# Patient Record
Sex: Female | Born: 1965 | Race: Black or African American | Hispanic: No | Marital: Married | Smoking: Never smoker
Health system: Southern US, Community
[De-identification: ages and names within clinical notes are randomized; demographics above are authoritative.]

## PROBLEM LIST (undated history)

## (undated) DIAGNOSIS — I10 Essential (primary) hypertension: Secondary | ICD-10-CM

## (undated) DIAGNOSIS — E785 Hyperlipidemia, unspecified: Secondary | ICD-10-CM

## (undated) HISTORY — PX: ABDOMINAL HYSTERECTOMY: SHX81

## (undated) HISTORY — PX: OTHER SURGICAL HISTORY: SHX169

---

## 1999-10-29 ENCOUNTER — Ambulatory Visit (HOSPITAL_COMMUNITY): Admission: RE | Admit: 1999-10-29 | Discharge: 1999-10-29 | Payer: Self-pay | Admitting: Family Medicine

## 1999-10-29 ENCOUNTER — Encounter: Payer: Self-pay | Admitting: Family Medicine

## 2002-08-20 ENCOUNTER — Ambulatory Visit (HOSPITAL_COMMUNITY): Admission: RE | Admit: 2002-08-20 | Discharge: 2002-08-20 | Payer: Self-pay | Admitting: Neurology

## 2002-08-20 ENCOUNTER — Encounter: Payer: Self-pay | Admitting: Neurology

## 2004-02-06 ENCOUNTER — Encounter: Admission: RE | Admit: 2004-02-06 | Discharge: 2004-02-06 | Payer: Self-pay | Admitting: Obstetrics & Gynecology

## 2005-12-22 ENCOUNTER — Emergency Department (HOSPITAL_COMMUNITY): Admission: EM | Admit: 2005-12-22 | Discharge: 2005-12-22 | Payer: Self-pay | Admitting: Emergency Medicine

## 2005-12-22 IMAGING — CT CT PELVIS W/ CM
3 of 5 series · 15 of 32 positions shown, 19 images · non-contrast
Comparison: none

CLINICAL DATA: Abdominal pain. Remote desmoid tumor resected. Tubal ligation.

[Series 2: abd pelvis · axial · 0.98mm/px · z∈[-409,-199]mm · 3 of 86 slices shown, 7 images]
[im 22/86  soft-tissue]
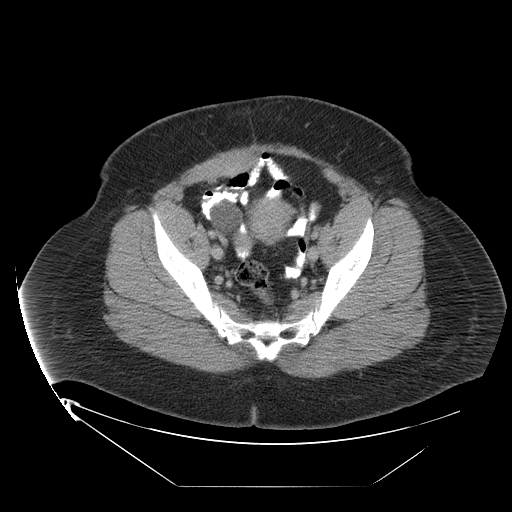
[im 22/86  lung]
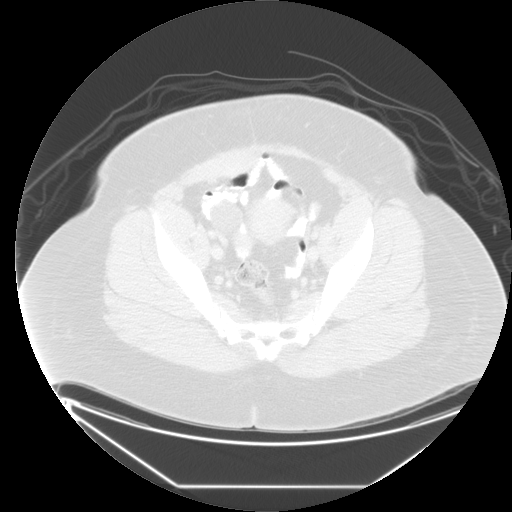
[im 22/86  bone]
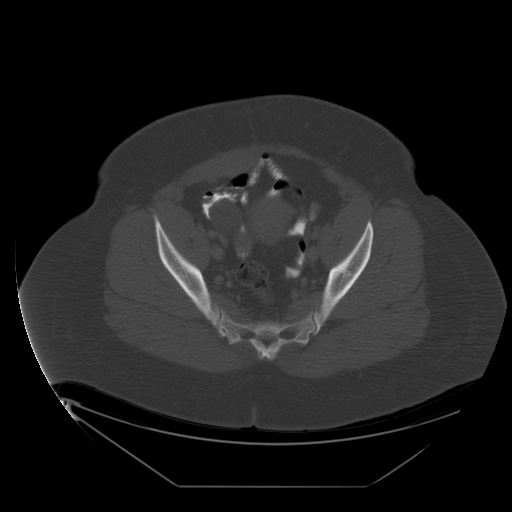
[im 43/86  soft-tissue]
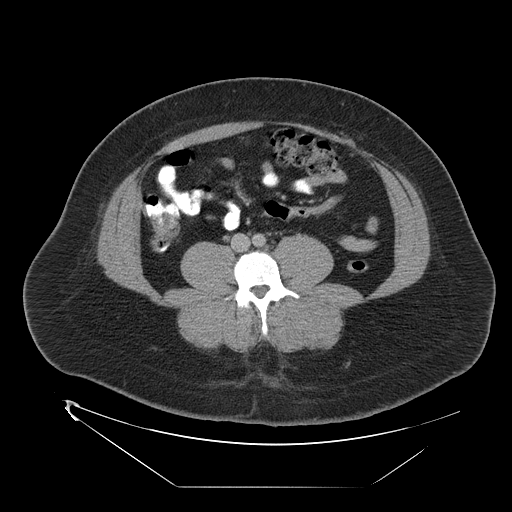
[im 43/86  lung]
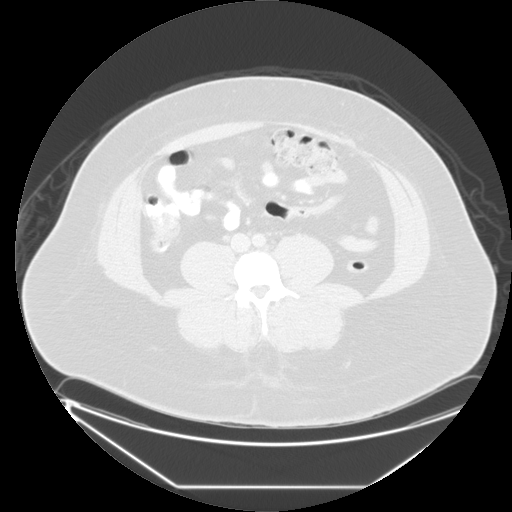
[im 64/86  soft-tissue]
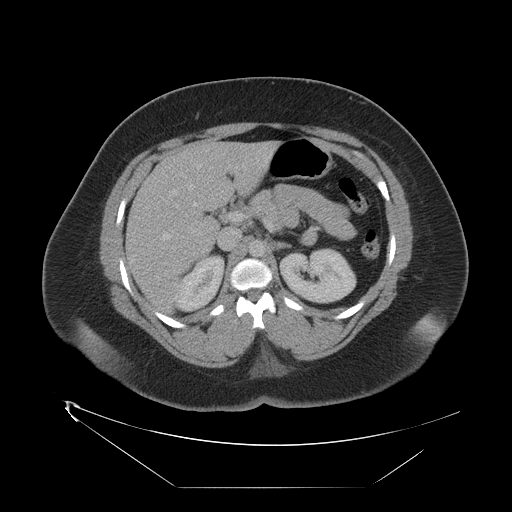
[im 64/86  lung]
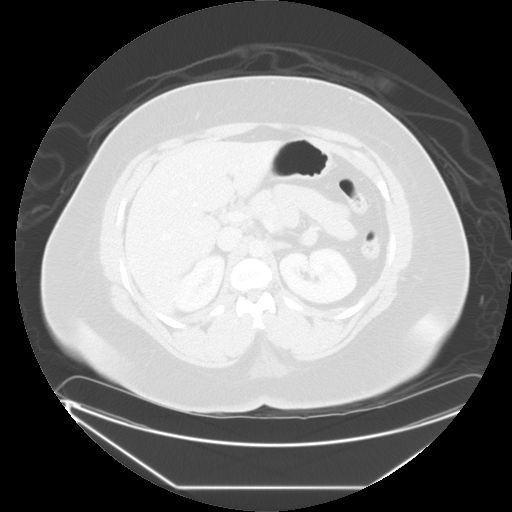

[Series 103: reformatted · sagittal · 0.98mm/px · 8 of 197 slices shown (1 of 2)]
[im 17/197  soft-tissue]
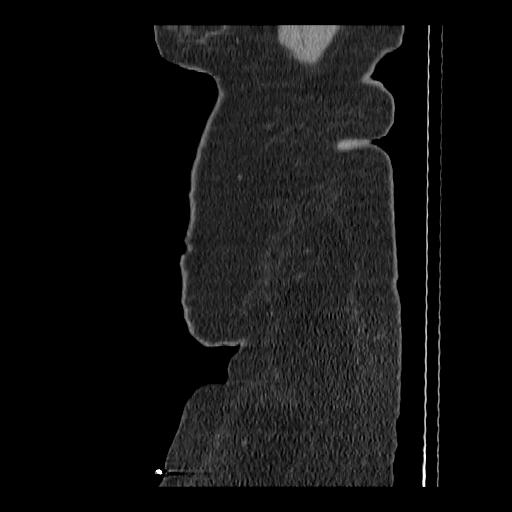
[im 50/197  soft-tissue]
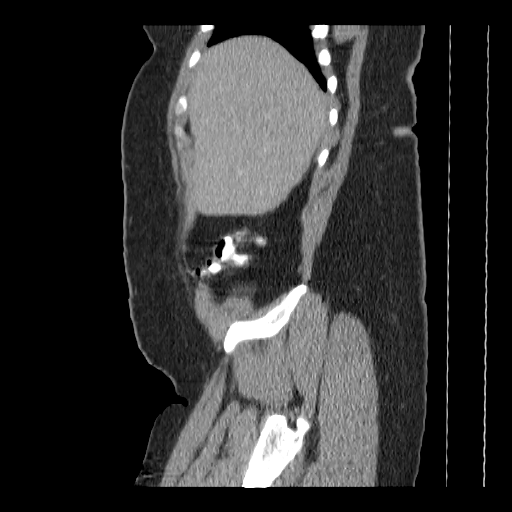
[im 66/197  soft-tissue]
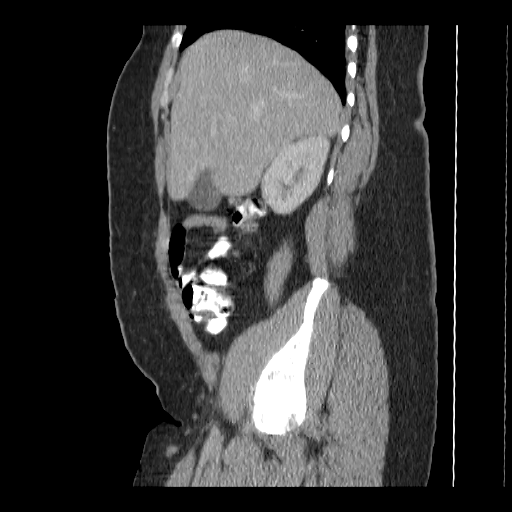
[im 82/197  soft-tissue]
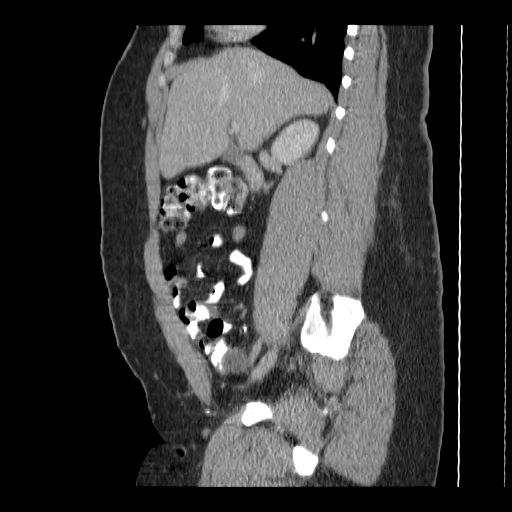
[im 115/197  soft-tissue]
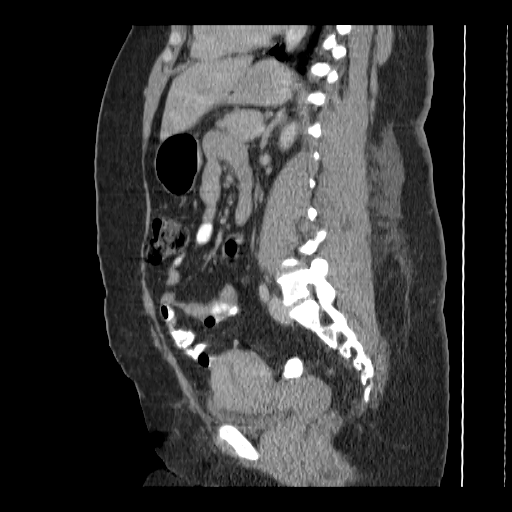
[im 131/197  soft-tissue]
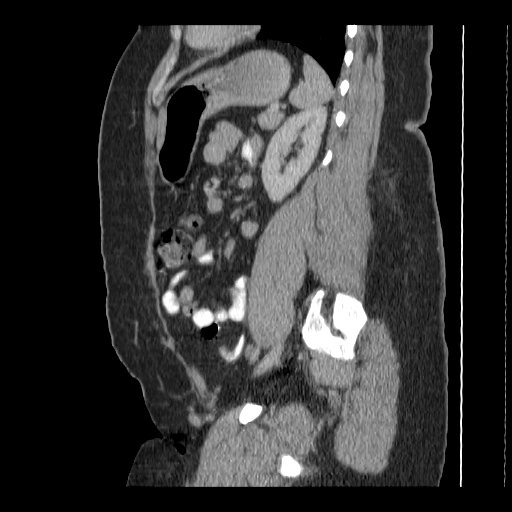
[im 148/197  soft-tissue]
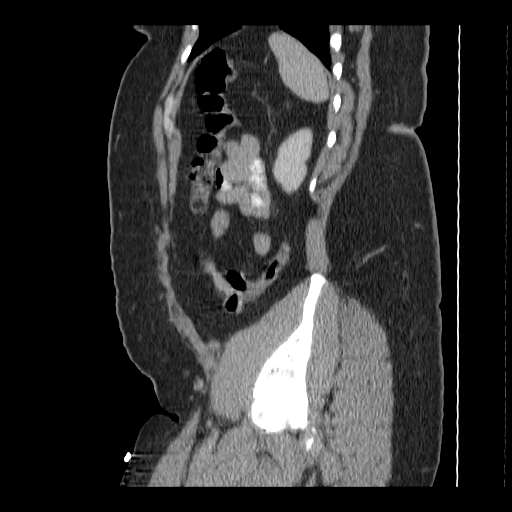
[im 180/197  soft-tissue]
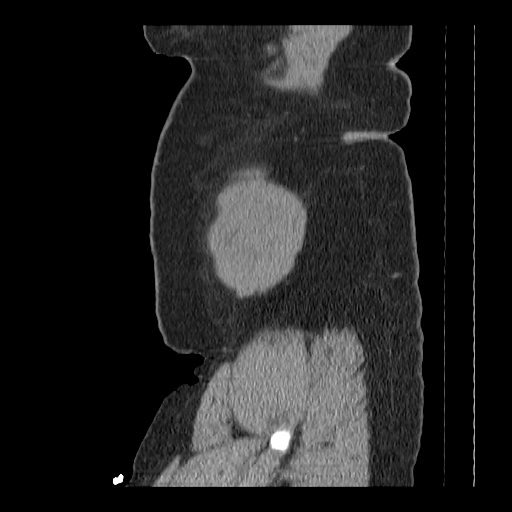

[Series 104: reformatted · coronal · 0.98mm/px · 4 of 163 slices shown (2 of 2)]
[im 17/163  soft-tissue]
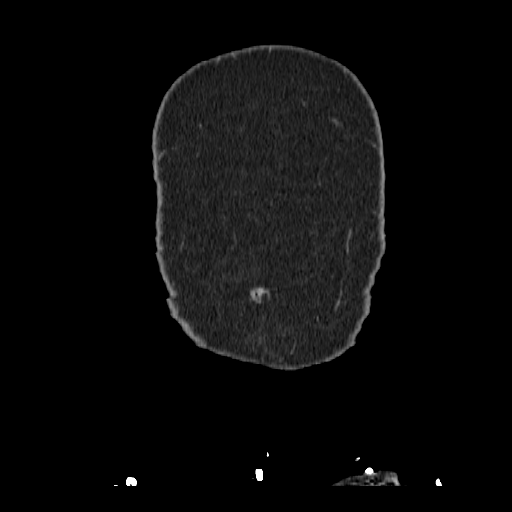
[im 33/163  soft-tissue]
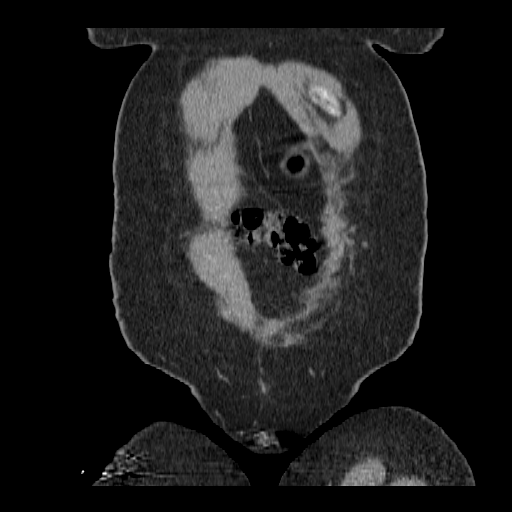
[im 49/163  soft-tissue]
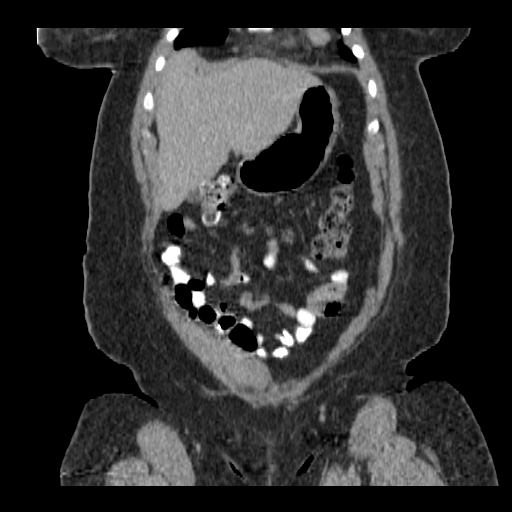
[im 65/163  soft-tissue]
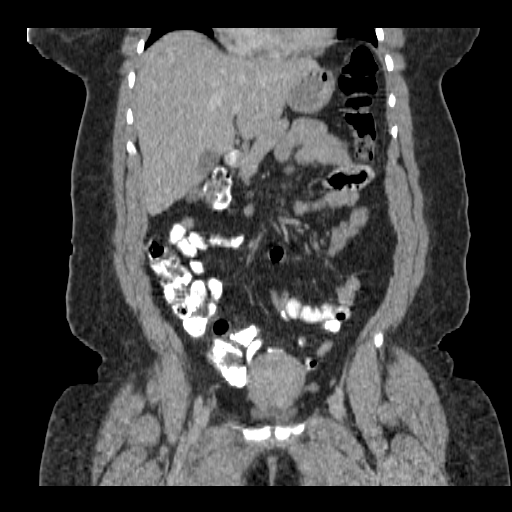

[15 of 32 positions shown; findings below may reference images not displayed]

CT abdomen with contrast:

Multidetector helical CT after 100 ml [V0] IV.
No previous for comparison. Visualized lung bases clear. Unremarkable liver,
gallbladder, spleen, adrenal glands, kidneys, pancreas, abdominal aorta, small
bowel. No free air. No ascites. Portal vein patent. No adenopathy localized.
Subcentimeter left periaortic and aortocaval lymph nodes incidentally noted.
Atrophy of the left rectus abdominal musculature.
IMPRESSION: 1. Negative for acute abdominal process.

CT pelvis with contrast:

Normal appendix. The colon is nondilated. Urinary bladder incompletely
distended. Uterus and left adnexal region unremarkable. There is 3 cm near fluid
attenuation right adnexal process, possibly a physiologic cyst although
nonspecific. No free fluid. Phlebolith in the lower left pelvis.
IMPRESSION: 1. 3 cm right adnexal cystic process, possibly physiologic cyst. Consider
followup ultrasound after 2 menstrual cycles to confirm appropriate resolution
and exclude mass.
2. Otherwise unremarkable CT pelvis

## 2005-12-22 IMAGING — CR DG CHEST 2V
2 series · 2 of 2 positions shown · non-contrast
Comparison: None.

CLINICAL DATA: Pain.
 CHEST ? 2 VIEW:

[view not recorded (1 of 2)]
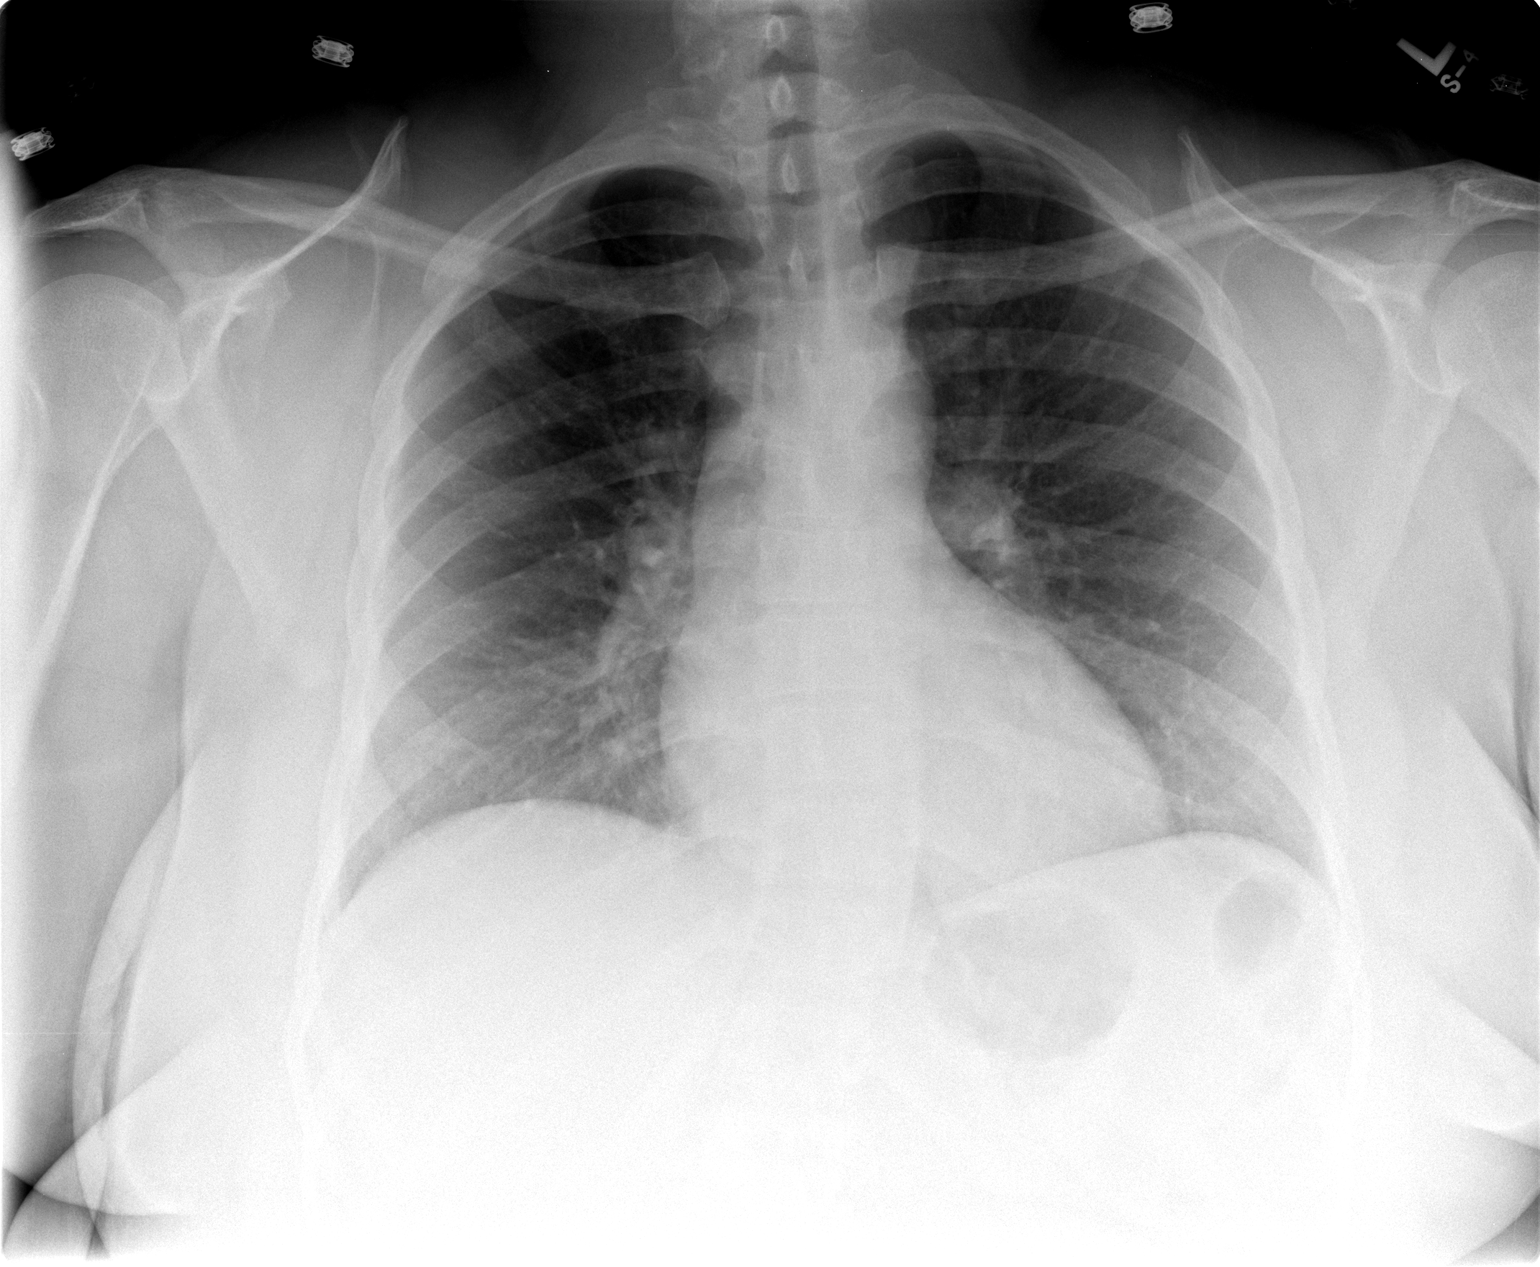

[view not recorded (2 of 2)]
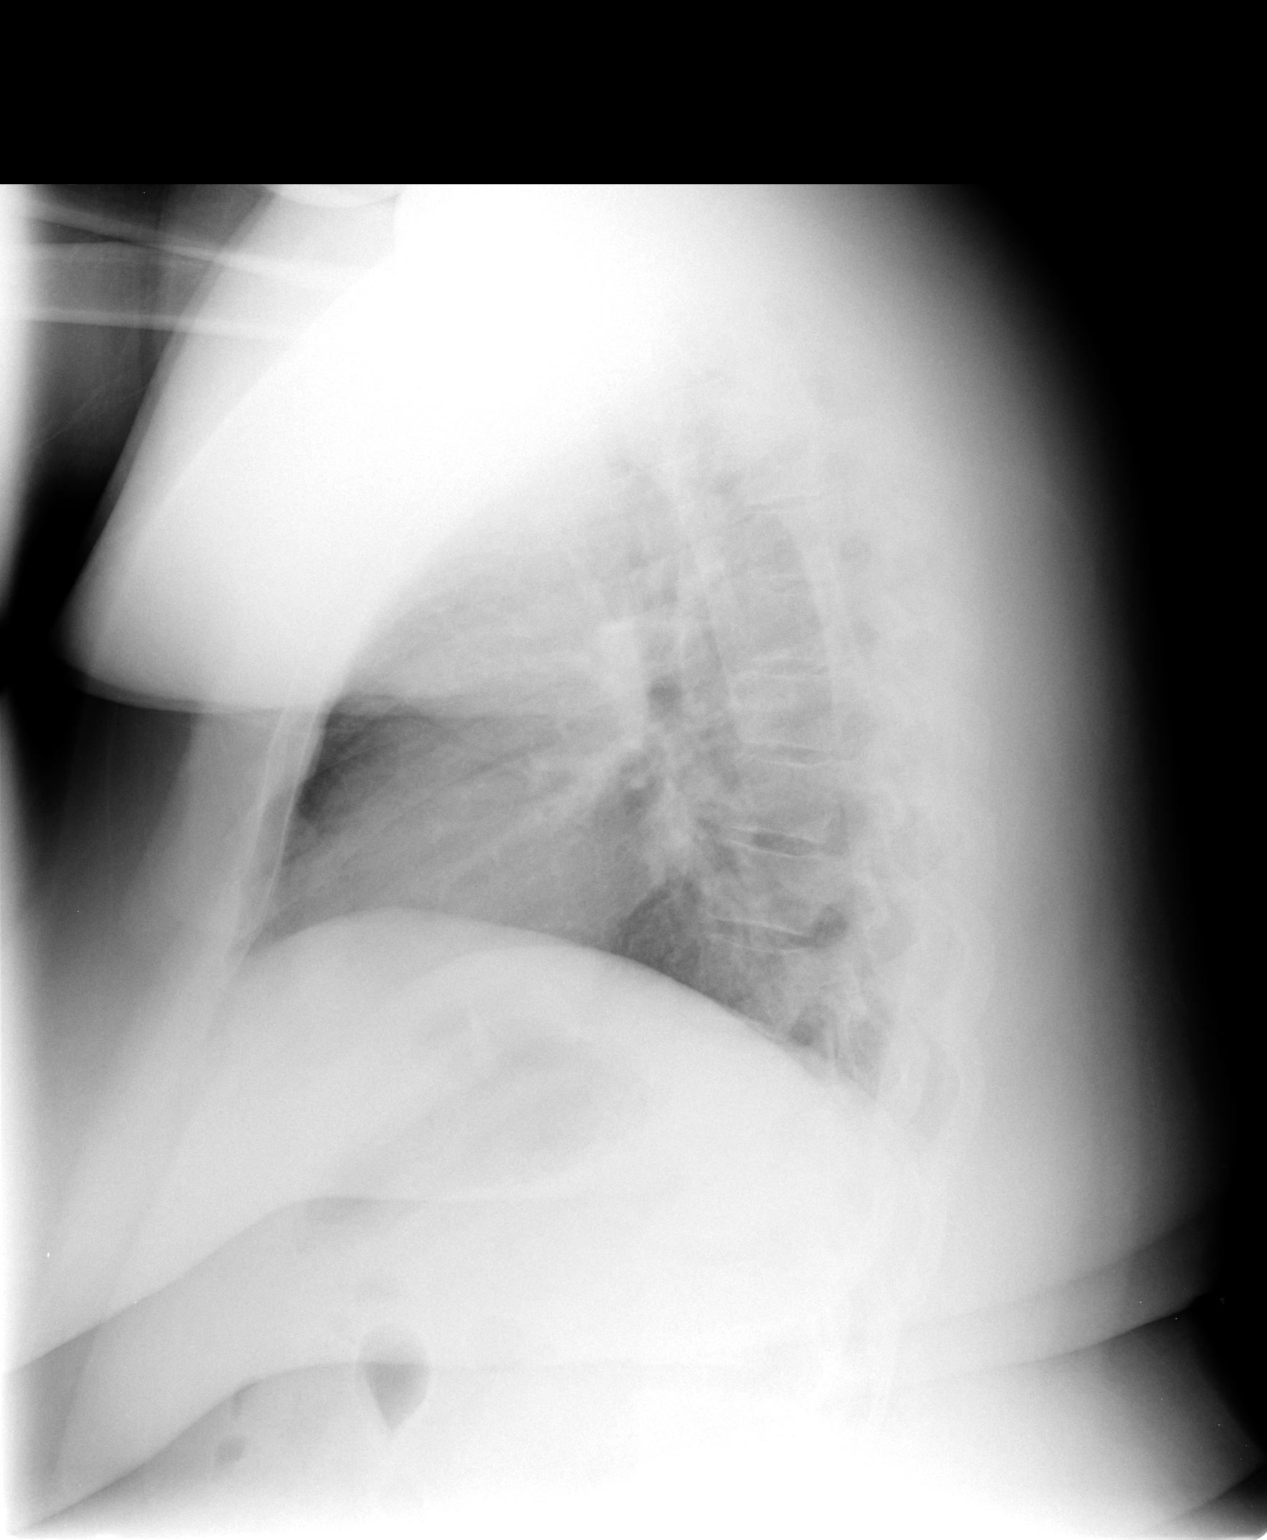

[2 of 2 positions shown; findings below may reference images not displayed]

FINDINGS: Midline trachea.  Heart size normal.  Mediastinal contours unremarkable.  Mild prominence of the ascending aorta can be seen with hypertension.  No pleural effusion or pneumothorax.  Mild right apical pleural thickening.  Lungs are clear.  Mild S-shaped spinal curvature.
IMPRESSION: No acute cardiopulmonary disease.

## 2007-07-05 ENCOUNTER — Ambulatory Visit (HOSPITAL_COMMUNITY): Admission: RE | Admit: 2007-07-05 | Discharge: 2007-07-05 | Payer: Self-pay | Admitting: Obstetrics & Gynecology

## 2007-07-05 IMAGING — US US TRANSVAGINAL NON-OB
1 series · 13 of 25 positions shown · non-contrast
Comparison: none

CLINICAL DATA: Abnormal uterine bleeding with heavy cycles.  No bleeding between cycles.  The patient is status post bilateral tubal ligation for desmoid tumor in [17] and abdominal mesh is in place.
 TRANSABDOMINAL AND TRANSVAGINAL PELVIC ULTRASOUND ? [DATE]:
TECHNIQUE: Both transabdominal and transvaginal ultrasound examinations of the pelvis were performed including evaluation of the uterus, ovaries, adnexal regions, and pelvic cul-de-sac.

[Series 1: us transvaginal non-ob · 0.30mm/px · 13 of 52 slices shown]
[im 1/52]
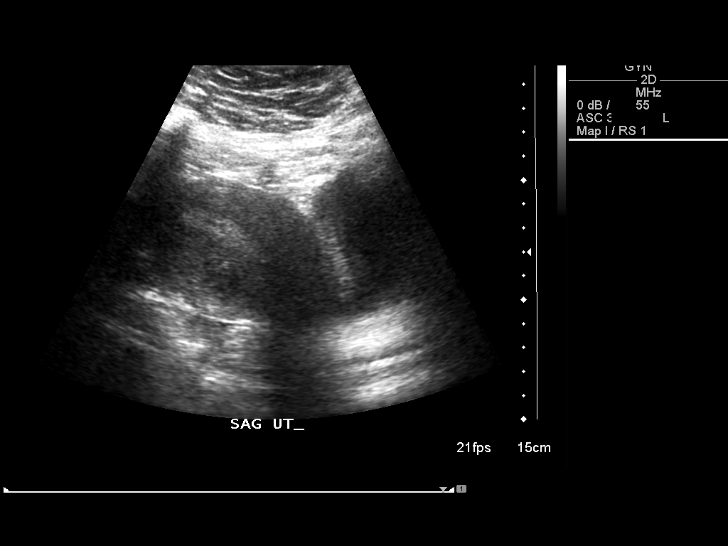
[im 5/52]
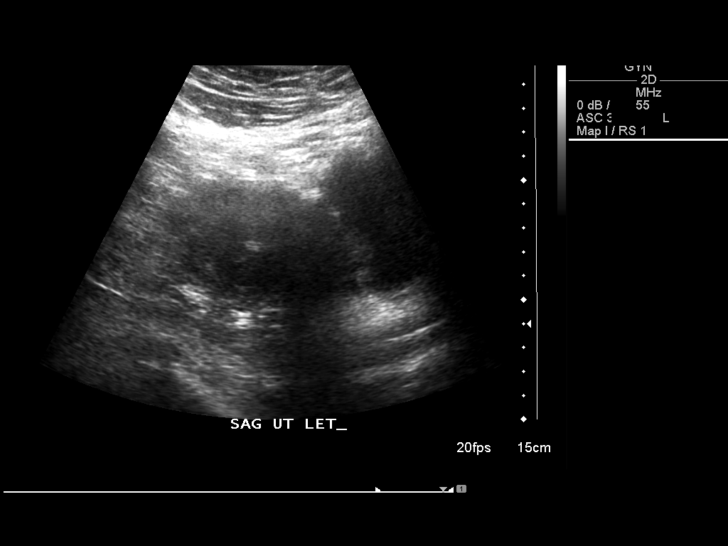
[im 9/52]
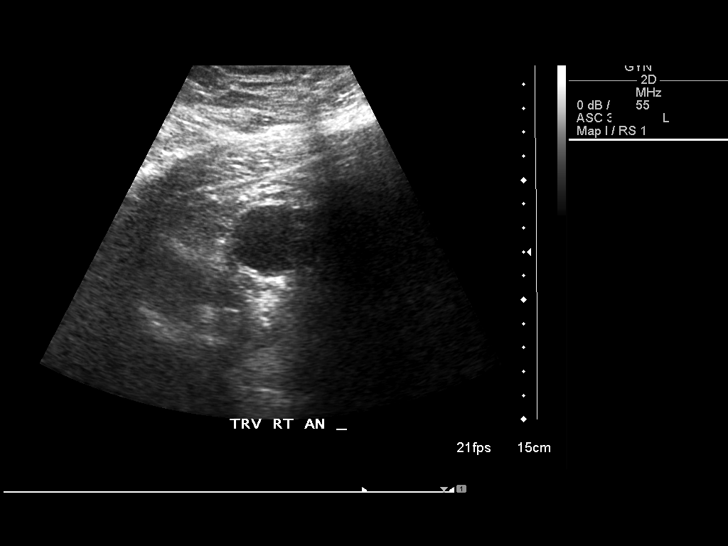
[im 13/52]
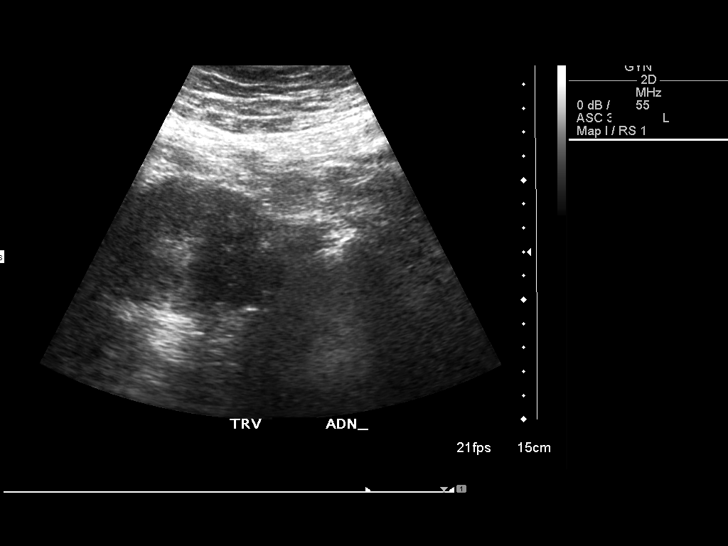
[im 18/52]
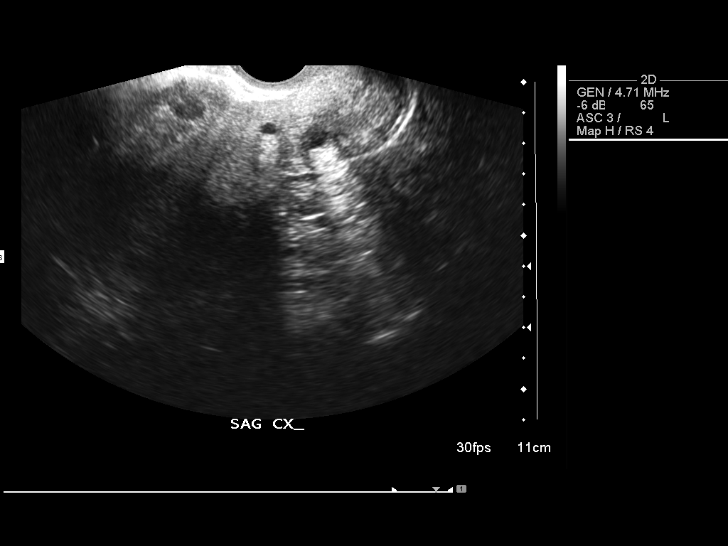
[im 22/52]
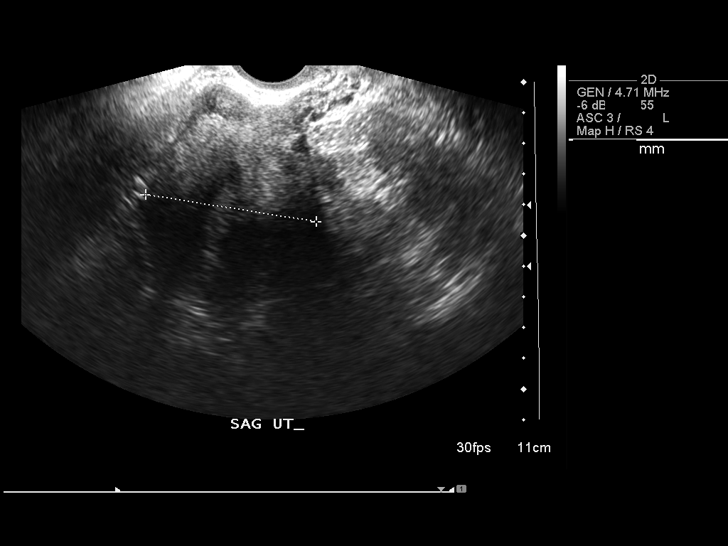
[im 26/52]
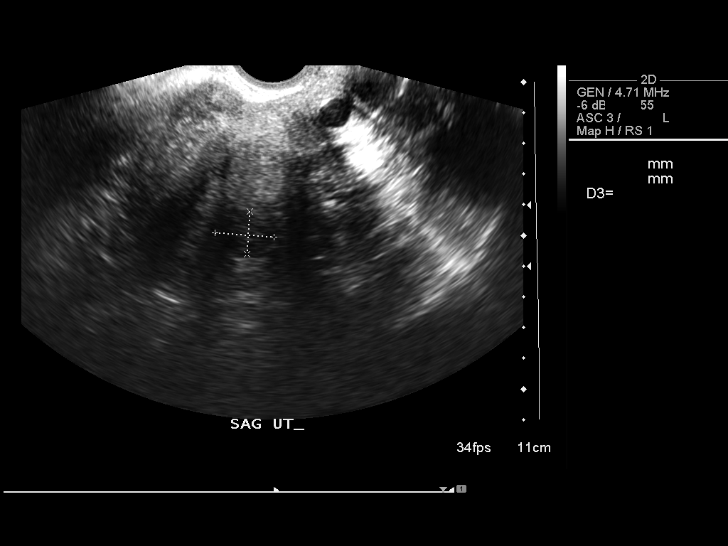
[im 30/52]
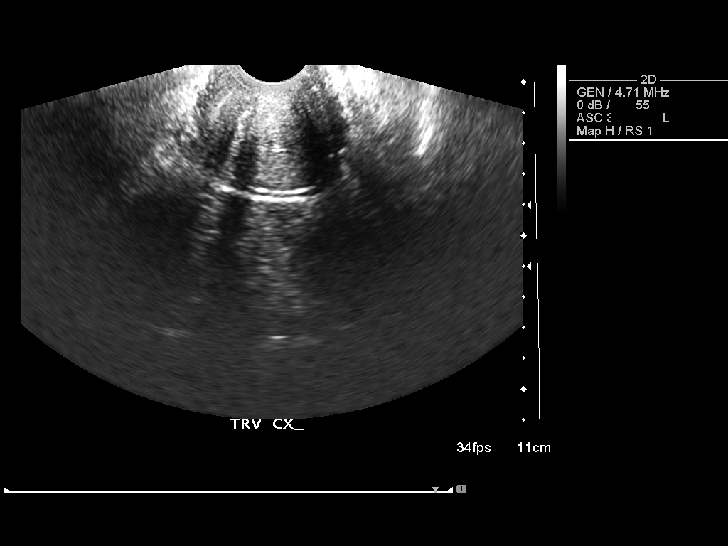
[im 35/52]
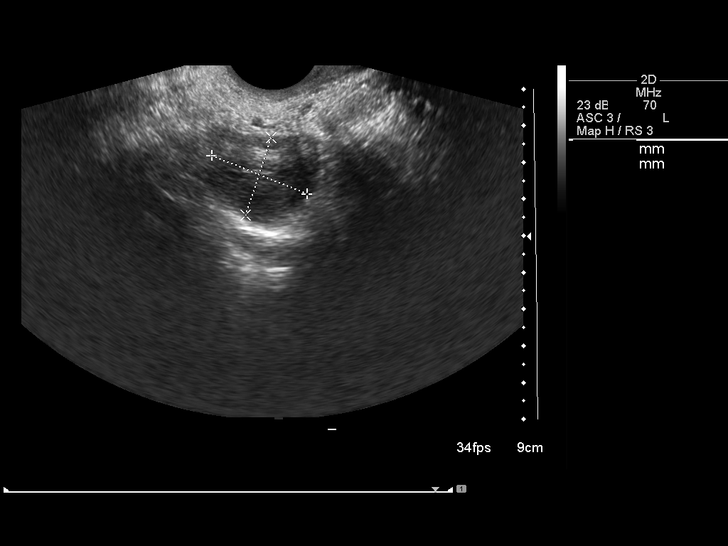
[im 39/52]
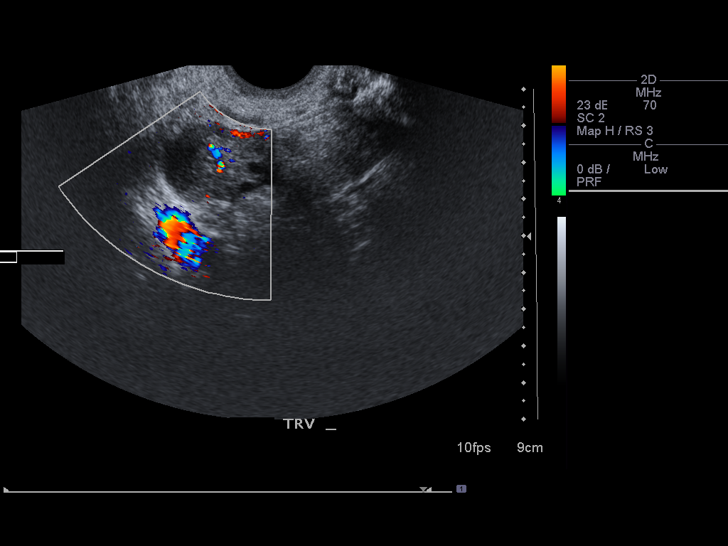
[im 43/52]
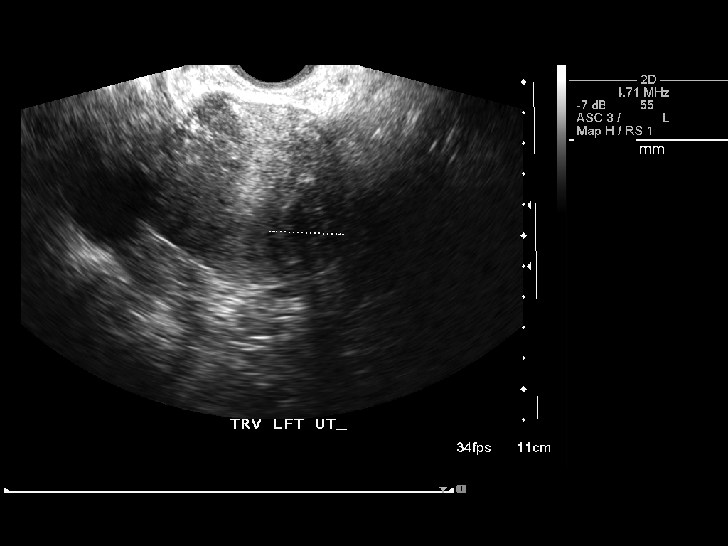
[im 47/52]
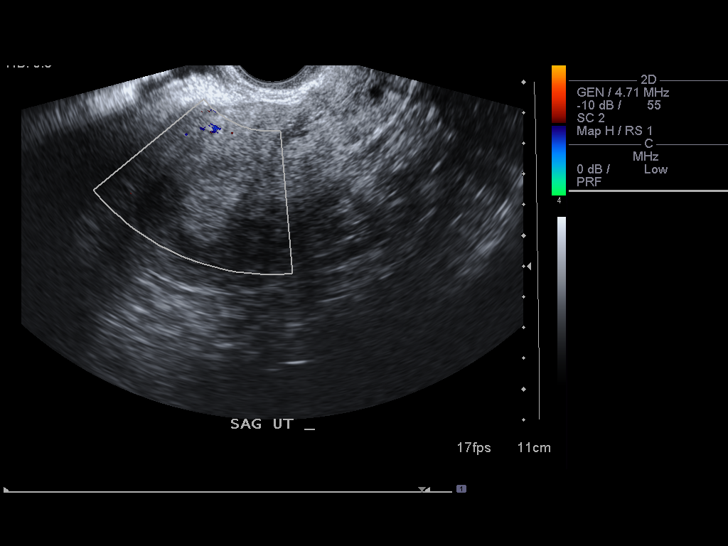
[im 52/52]
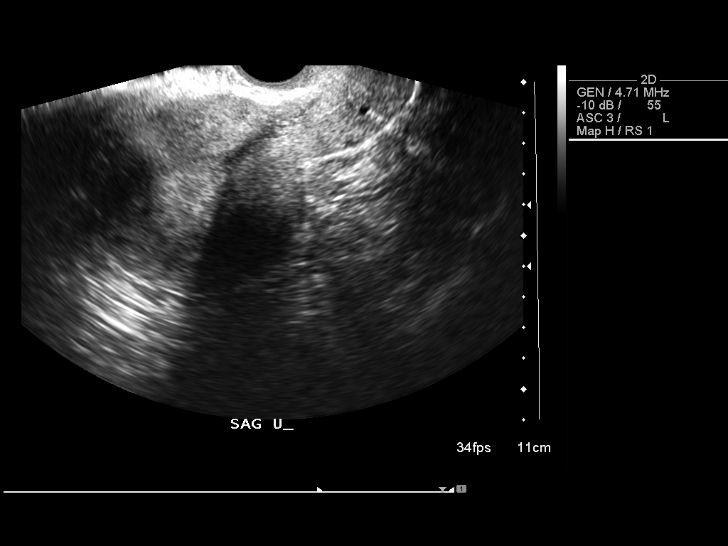

[13 of 25 positions shown; findings below may reference images not displayed]

FINDINGS: Multiple images of the uterus and adnexa were obtained using transabdominal and endovaginal approaches.   The uterus has a sagittal length of 11.3 cm, an AP width of 5.6 cm, and a transverse width of 6.6 cm.  A focal fibroid is identified in the posterior right aspect of the uterus measuring 3.0 x 3.2 x 3.0 cm and a second is suggested in the left lateral midbody measuring 3.0 x 1.4 x 2.3 cm.  A small fundal fibroid is seen measuring 1.9 x 1.4 x 1.5 cm.  
 The endometrial canal appears thickened with an AP width of 12 mm.  No areas of abnormal vascularity or inhomogeneity are seen and this may be thickened as the patient is currently in the presecretory phase of her cycle with an LMP of [DATE].  Given the history of abnormal uterine bleeding, reevaluation in the immediate postsecretory phase of the cycle would be useful to assess for resolution of the endometrial prominence.   If this remains thick in the postsecretory phase of the cycle, sonohysterography would be useful for more complete evaluation.
 The right ovary has a normal appearance measuring 2.8 x 2.2 x 2.7 cm.  The left ovary could not be seen with confidence either transabdominally or endovaginally.  Transabdominal images suggest the presence of a right adnexal cyst which measures 3.0 x 3.0 x 3.4 cm.  This would correlate with the cystic adnexal finding seen on the prior CT on [DATE].  As this is separate from the right ovary, it is possible that this represents a dilated portion of the fallopian tube on this side however, this cannot be confirmed.  Lack of interval change in size of this finding since the prior CT would suggest that this is benign in etiology.  Given the patient?s prior surgery, this could represent a lymphocele or large paraovarian cyst.
IMPRESSION: 1.  Fibroid uterus with fibroid sizes and locations as noted above.  
 2.  Thickened endometrial lining, question related to the patient?s presecretory phase of the cycle versus other. Reevaluation in the postsecretory phase of the cycle is recommended for initial reassessment.
 3.  Normal right ovary and nonvisualized left ovary.
 4.  Right adnexal simple cyst which appears stable in comparison with the prior CT from [17].  The etiology of this is uncertain but it is felt unlikely to be clinically significant given the lack of interval change in over a year. This can be reevaluated at the time of endometrial follow-up.

## 2008-06-05 ENCOUNTER — Encounter: Payer: Self-pay | Admitting: Obstetrics & Gynecology

## 2008-06-05 ENCOUNTER — Inpatient Hospital Stay (HOSPITAL_COMMUNITY): Admission: RE | Admit: 2008-06-05 | Discharge: 2008-06-07 | Payer: Self-pay | Admitting: Obstetrics & Gynecology

## 2008-06-10 ENCOUNTER — Inpatient Hospital Stay (HOSPITAL_COMMUNITY): Admission: AD | Admit: 2008-06-10 | Discharge: 2008-06-18 | Payer: Self-pay | Admitting: Obstetrics

## 2008-06-12 IMAGING — CT CT ABDOMEN W/ CM
2 of 3 series · 13 of 32 positions shown, 18 images · IV contrast (40ML OMNI-MIX & 150ml omni/300%)
Comparison: CT from [DATE]

CT ABDOMEN

CLINICAL DATA: Fever after hysterectomy.  Abdominal pressure along
the surgical incision.

CT ABDOMEN AND PELVIS WITH CONTRAST
TECHNIQUE: Multidetector CT imaging of the abdomen and pelvis was
performed using the standard protocol following bolus
administration of intravenous contrast.
Contrast: 150 ml [8S]

[Series 5: abd pelvis · axial · 0.70mm/px · z∈[-420,-110]mm · 5 of 90 slices shown, 10 images]
[im 15/90  soft-tissue]
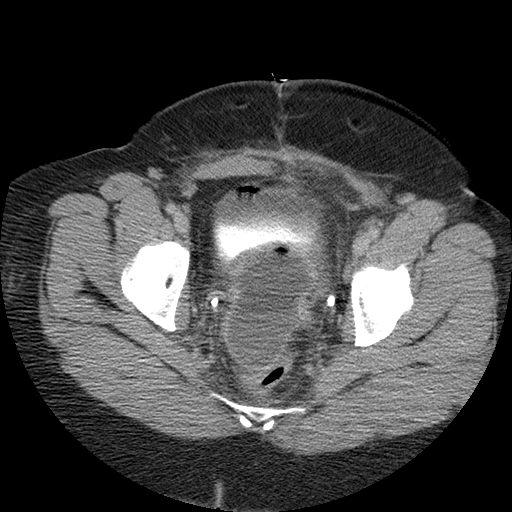
[im 15/90  bone]
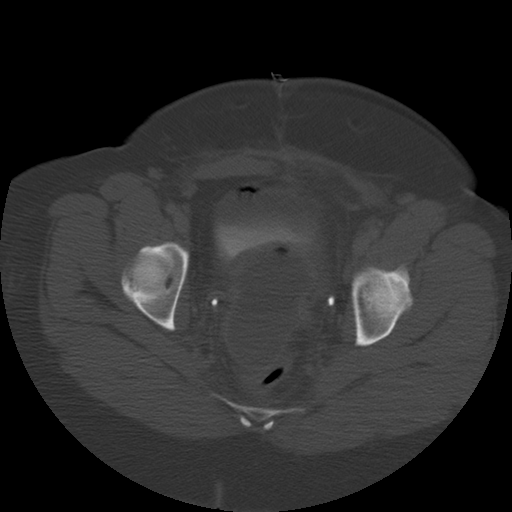
[im 30/90  soft-tissue]
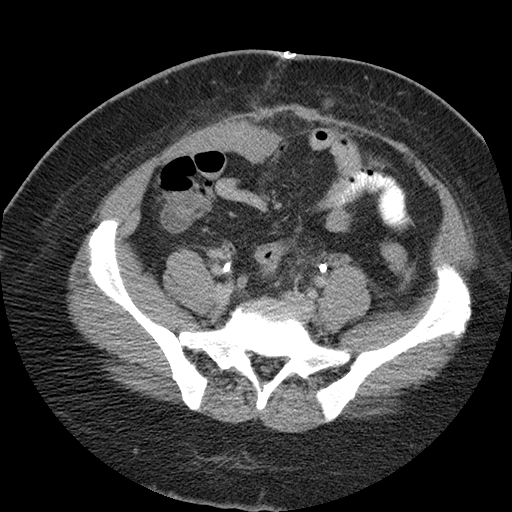
[im 30/90  lung]
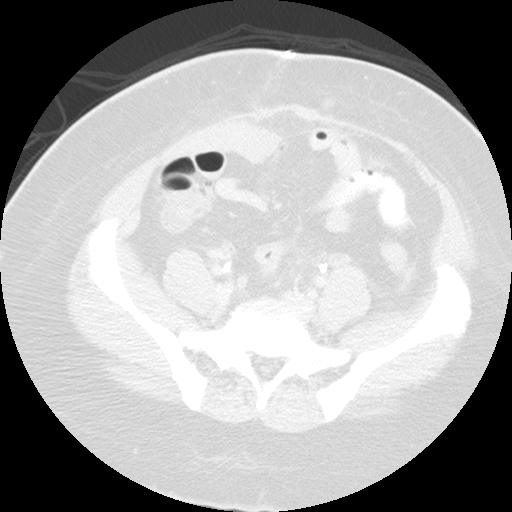
[im 45/90  soft-tissue]
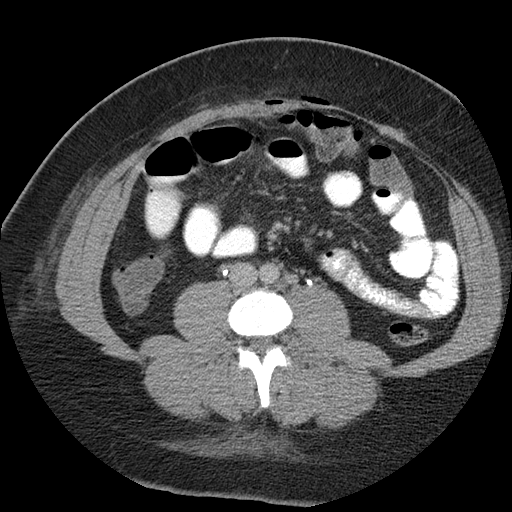
[im 45/90  lung]
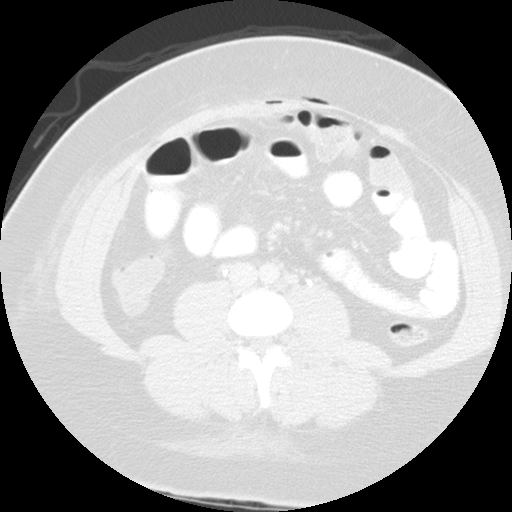
[im 60/90  soft-tissue]
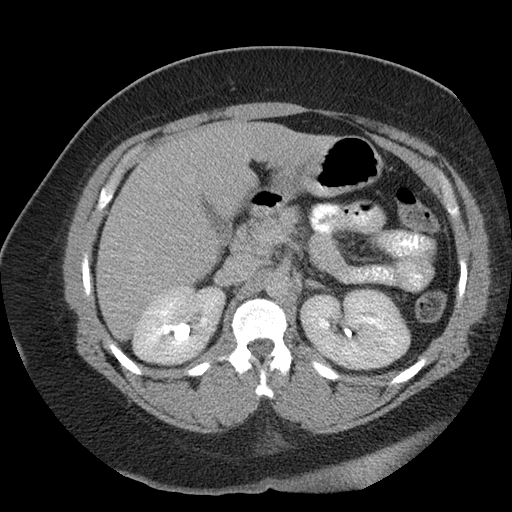
[im 60/90  lung]
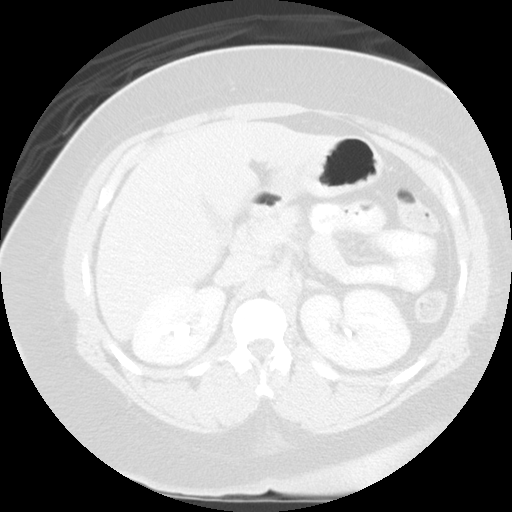
[im 75/90  soft-tissue]
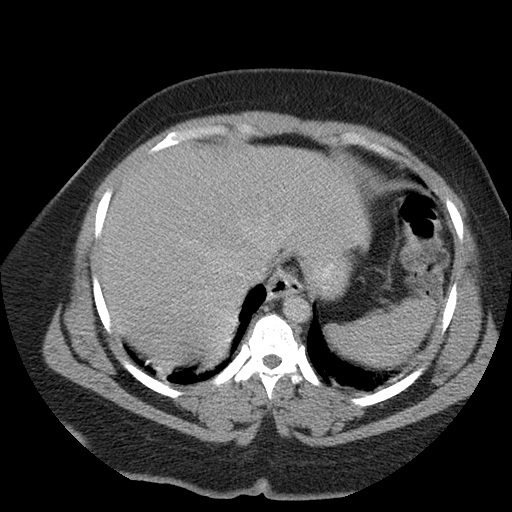
[im 75/90  lung]
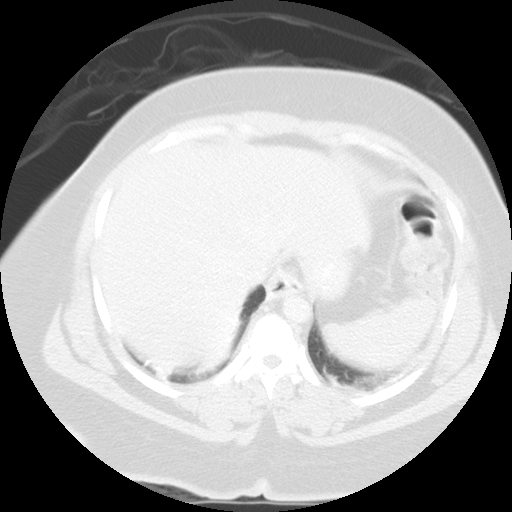

[Series 701: reformatted · sagittal · 0.96mm/px · 8 of 177 slices shown]
[im 15/177  soft-tissue]
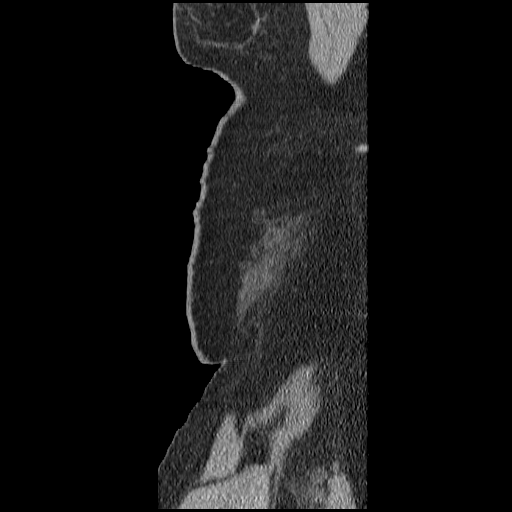
[im 45/177  soft-tissue]
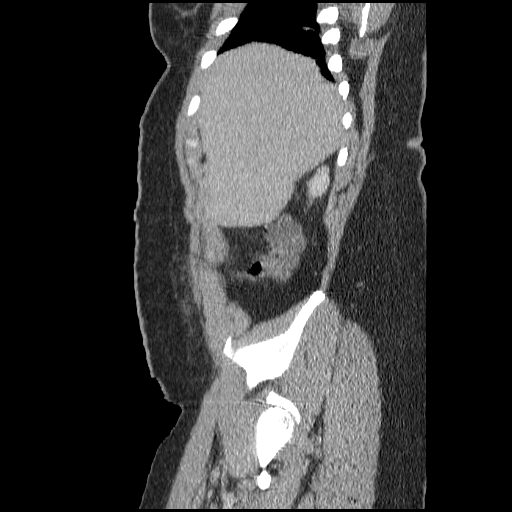
[im 59/177  soft-tissue]
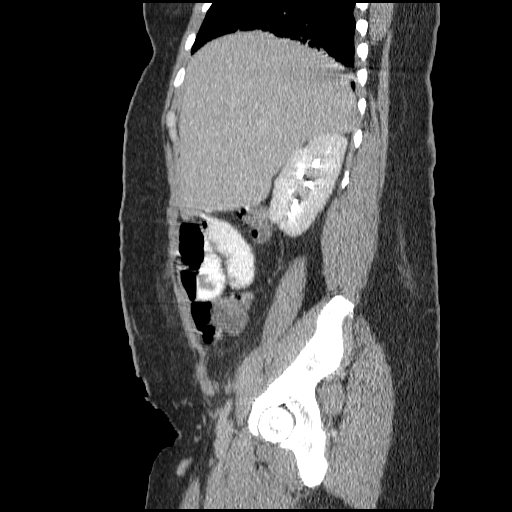
[im 74/177  soft-tissue]
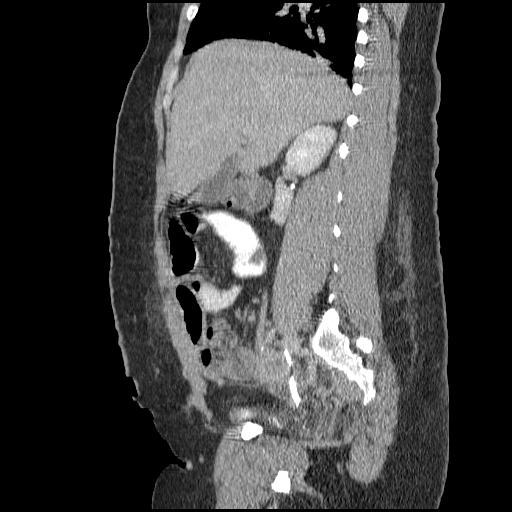
[im 103/177  soft-tissue]
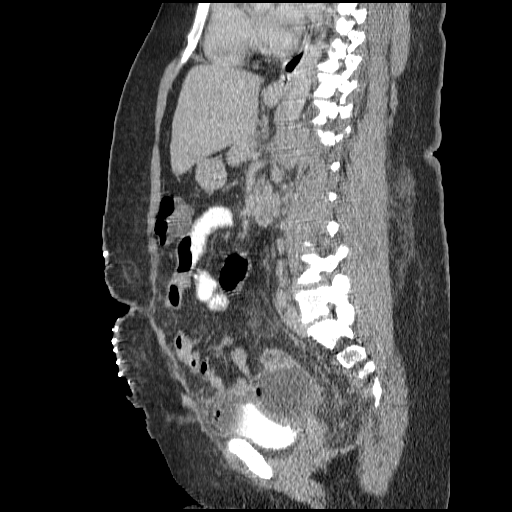
[im 118/177  soft-tissue]
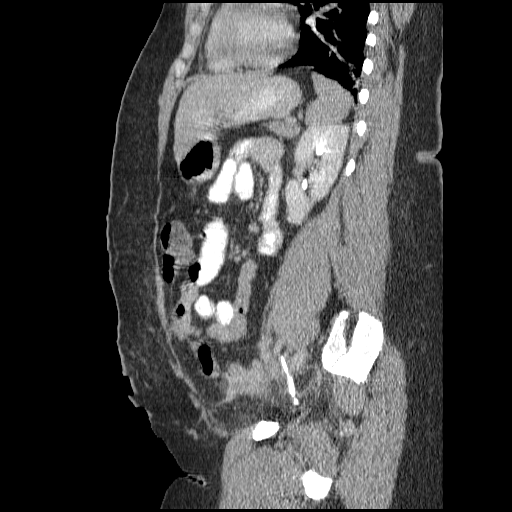
[im 133/177  soft-tissue]
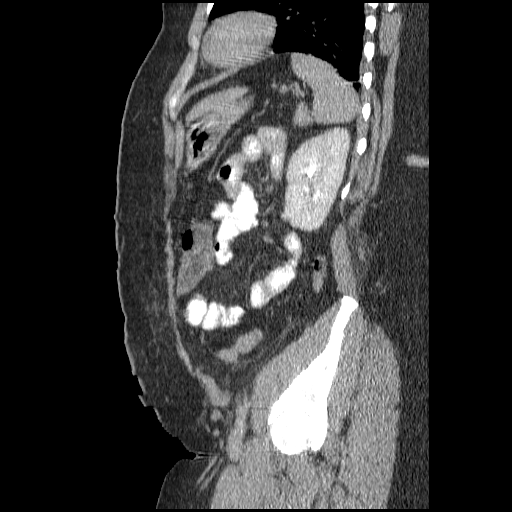
[im 162/177  soft-tissue]
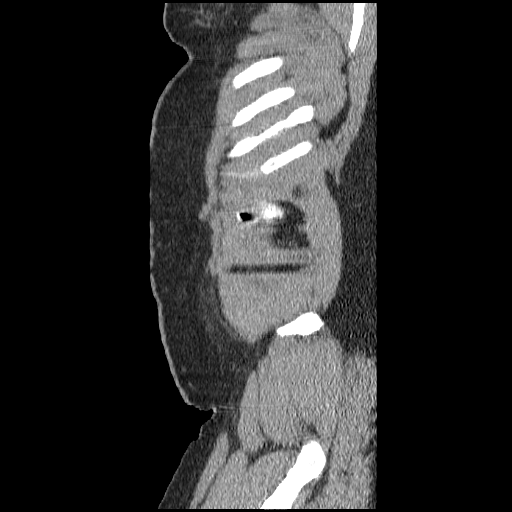

[13 of 32 positions shown; findings below may reference images not displayed]

FINDINGS: Lung bases are clear with exception of mild atelectasis.
There is a small amount of gas anterior to the abdominal cavity
within the subcutaneous tissues. Again seen is asymmetry of the
rectus muscles with atrophy of the left side and hypertrophy along
the right side.  There is gas superficial to the right rectus
muscle.  Normal appearance of the liver, gallbladder, spleen and
pancreas.  The phase contrast imaging is delayed because the
patient had an episode of emesis during the procedure.  There is no
gross abnormality involving the adrenal tissue, kidneys or stomach.
There are small lymph nodes scattered throughout the distal
abdominal aorta retroperitoneum.  There is also small amount of
stranding in the sigmoid mesocolon that may be related to the
recent surgery.  There is a large air-fluid pocket in the dependent
pelvis.  There is an additional air-fluid collection in the right
lower quadrant located above the large pelvic collection.  The
smaller collection measures 4.5 x 4.2 cm but probably represents
the cecum.  There are slightly enlarged lymph nodes throughout the
ileocecal mesentery.  The appendix appears normal.  No acute bony
abnormalities
IMPRESSION: Large pelvic fluid collection.  Findings are consistent with an
abscess collection.  Please refer to the pelvic CT findings.  There
is an additional air-fluid collection in the right lower quadrant
that probably represents the cecum rather than an additional
abscess collection.  However, this area is difficult to evaluate
due to the inflammatory and postoperative changes.

Postoperative changes along the abdominal midline.

Stranding in the lower abdominal mesentery and retroperitoneum with
mild adenopathy.  Suspect these findings are reactive or post
inflammatory in nature.

CT PELVIS
FINDINGS: There is a large air-fluid collection in the dependent
pelvis that measures 6.5 x 9.1 cm.  Findings are suspicious for a
postoperative abscess collection.  The adnexa tissue is not
confidently identified.  There is no significant free fluid
present.  There is contrast within the urinary bladder and no gross
abnormality in the ureter.  The ureters are well opacified due to
the delayed nature of this examination.  No acute bony abnormality.
IMPRESSION: Large air-fluid collection in the pelvis.  Findings highly
concerning for a postoperative abscess collection.  This collection
would be amendable to percutaneous draining from a transgluteal
approach.

## 2008-06-13 ENCOUNTER — Encounter: Payer: Self-pay | Admitting: Obstetrics

## 2008-06-13 IMAGING — CT CT ABCESS DRAINAGE
1 of 4 series · 13 of 32 positions shown, 19 images · non-contrast
Comparison: none

CLINICAL HISTORY: Recent hysterectomy with fevers and pelvic fluid
collection.

[Series 2: abscess 5.0 b40f st · axial · 0.98mm/px · z∈[-366,-136]mm · 13 of 54 slices shown, 19 images]
[im 4/54  soft-tissue]
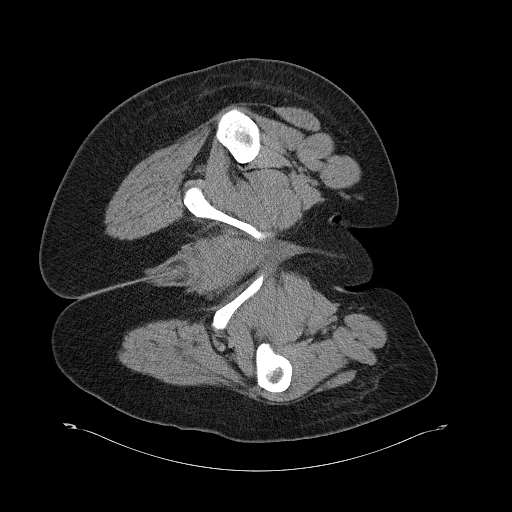
[im 4/54  bone]
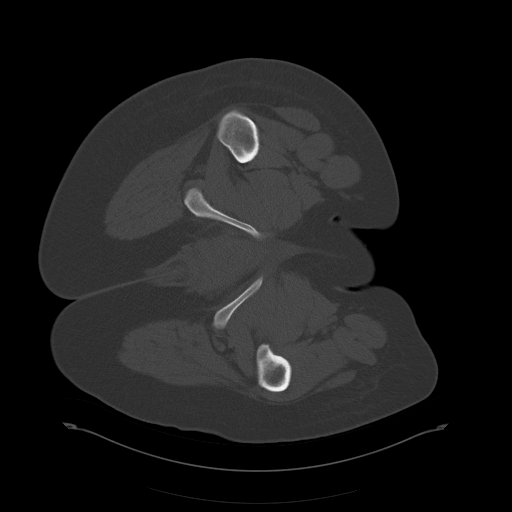
[im 8/54  soft-tissue]
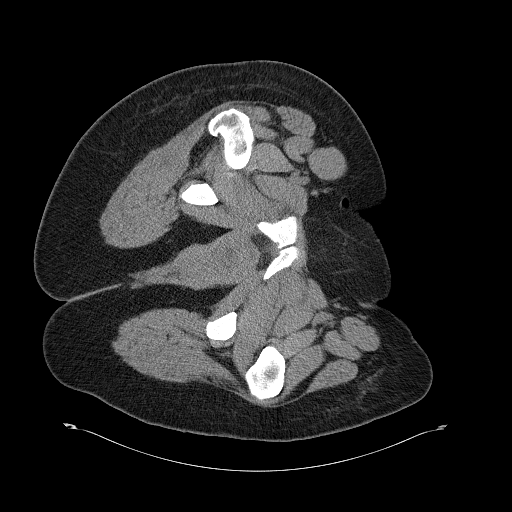
[im 12/54  soft-tissue]
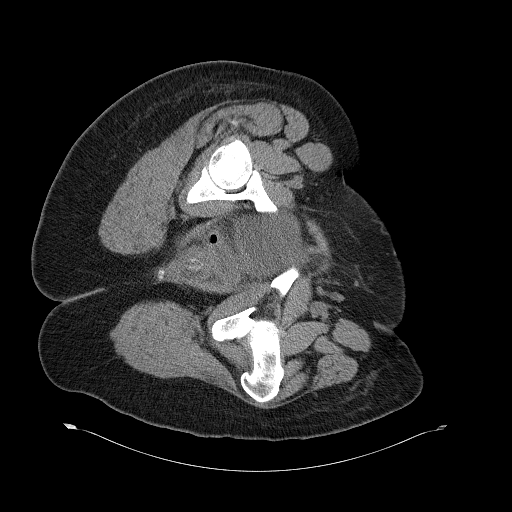
[im 16/54  soft-tissue]
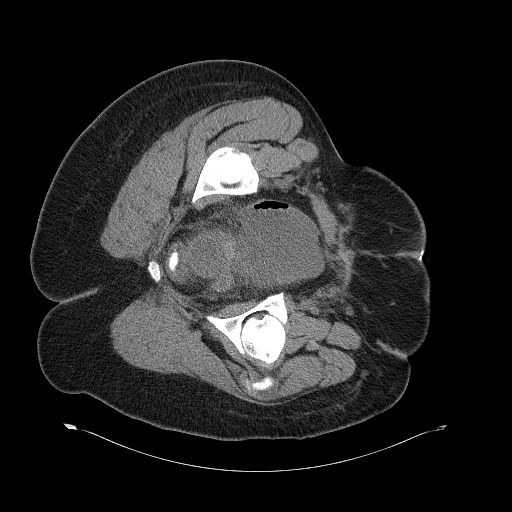
[im 19/54  soft-tissue]
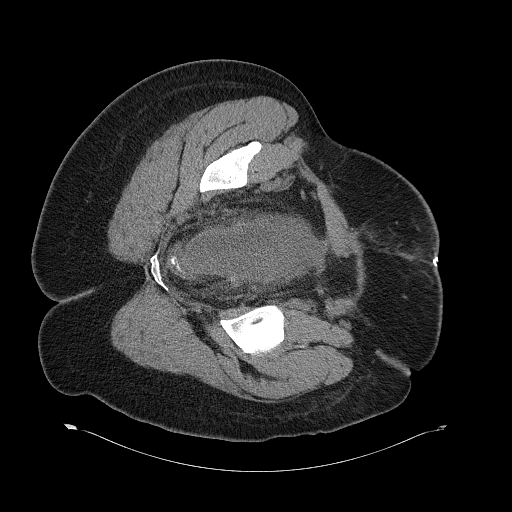
[im 23/54  soft-tissue]
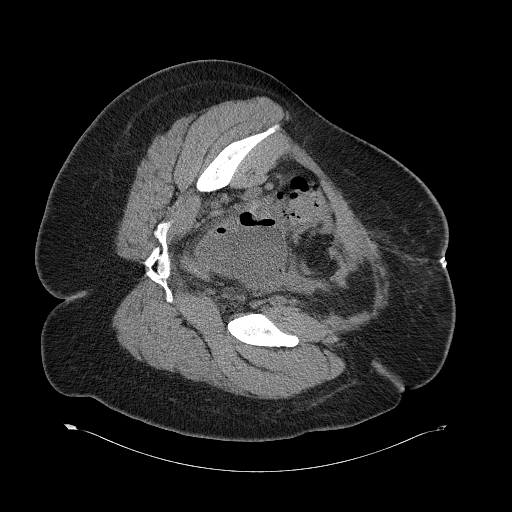
[im 27/54  soft-tissue]
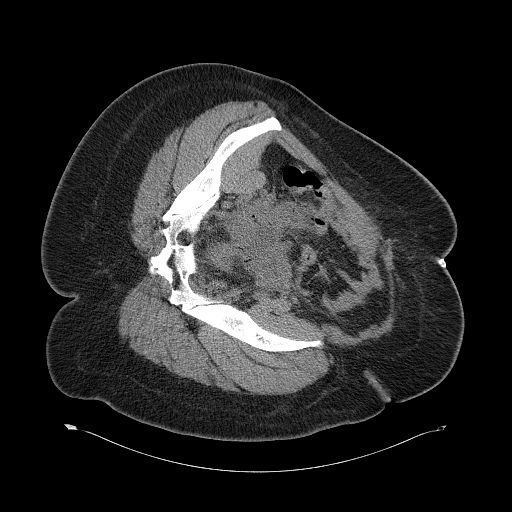
[im 31/54  soft-tissue]
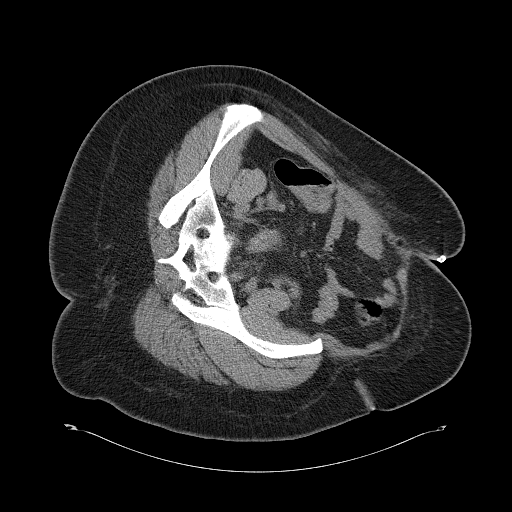
[im 35/54  soft-tissue]
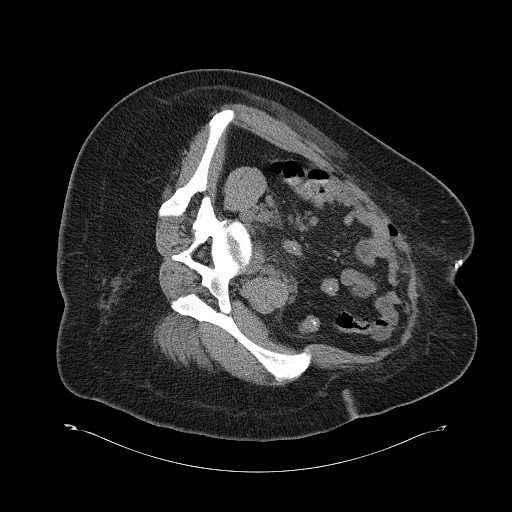
[im 35/54  bone]
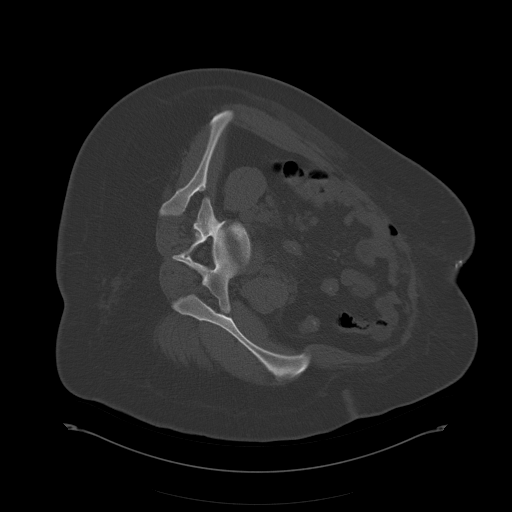
[im 38/54  soft-tissue]
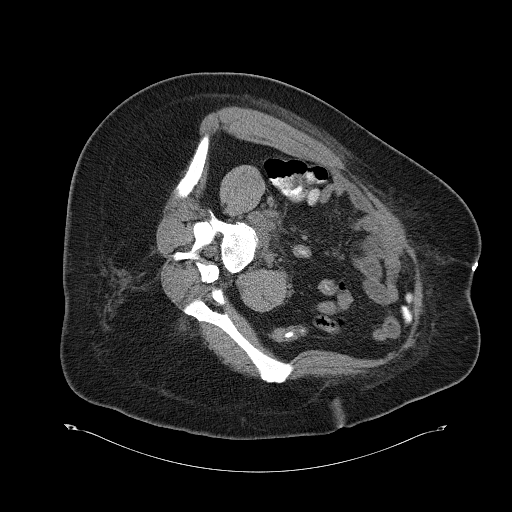
[im 38/54  lung]
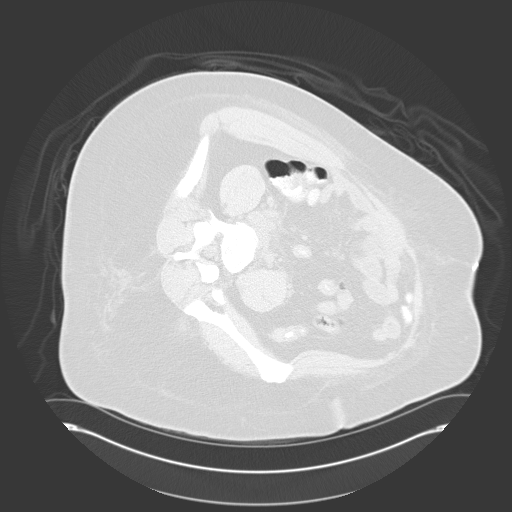
[im 42/54  soft-tissue]
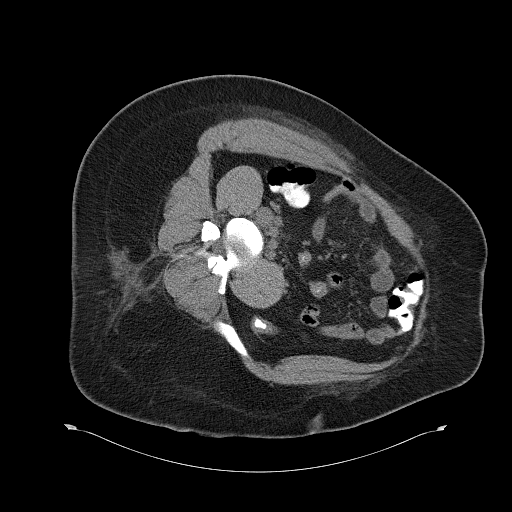
[im 42/54  lung]
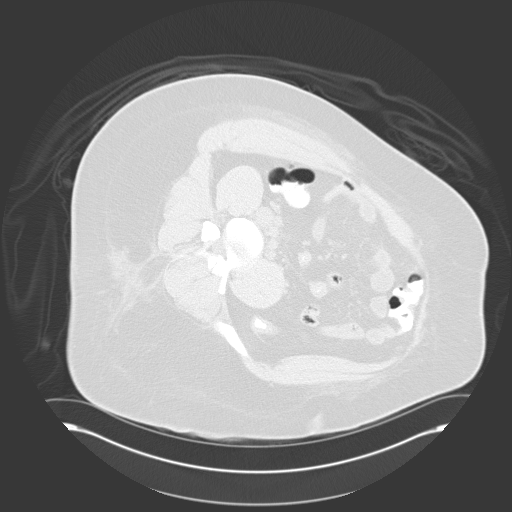
[im 46/54  soft-tissue]
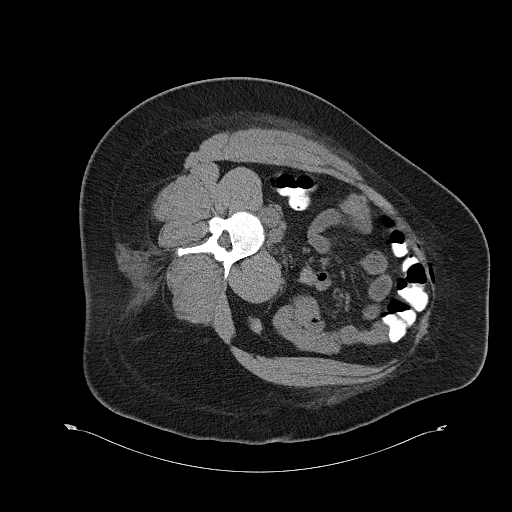
[im 46/54  lung]
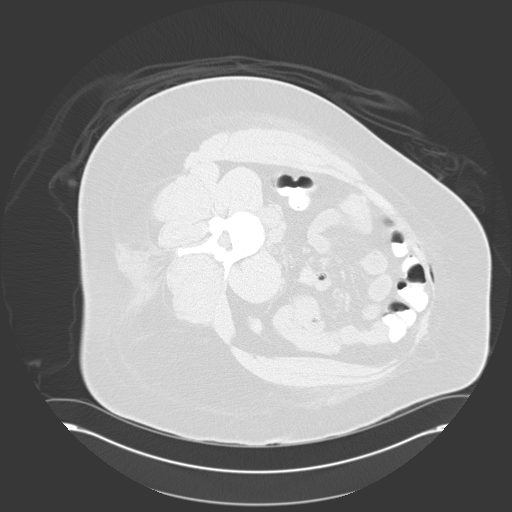
[im 50/54  soft-tissue]
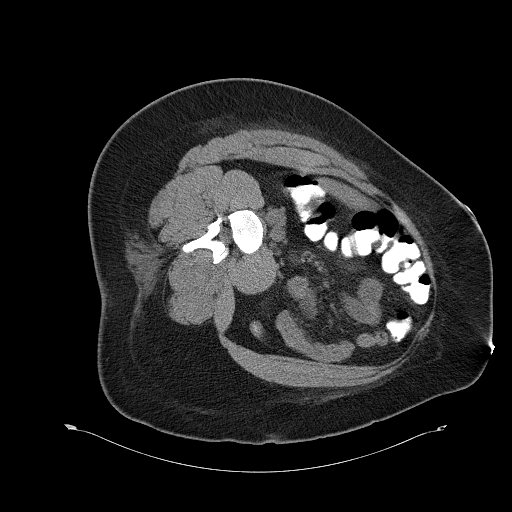
[im 50/54  lung]
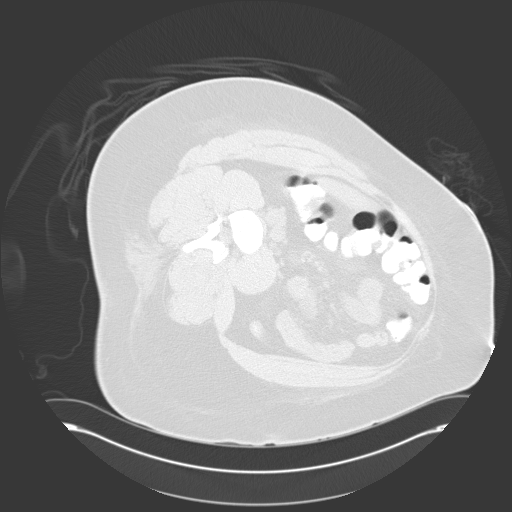

[13 of 32 positions shown; findings below may reference images not displayed]

PROCEDURE(S): CT GUIDED PELVIC DRAIN PLACEMENT.

Medications:Versed 4 mg, Fentanyl 100 mcg

Sedation time:35 minutes

Procedure:The patient was transferred from the [HOSPITAL].
The patient was explained the risks of this procedure and informed
consent was obtained.  The patient was unable to lie in prone due
to her abdominal incision.  The patient was placed in the left
lateral decubitus position.  Images of the pelvis were obtained.
The pelvic fluid collection was targeted from the right
transgluteal approach.  The skin and subcutaneous tissues were
anesthetized with 1% lidocaine.  19 gauge needle was directed into
the pelvic fluid collection which was anterior to the rectum. Foul-
smelling red fluid was aspirated.  The Amplatz wire was advanced
into this collection.  The tract was dilated to 10-French and a 10-
French drainage catheter was placed.  160 ml of cloudy red fluid
was removed.  The catheter was secured to the skin using suture.
Catheter was connected to a gravity drain.
FINDINGS: Large air-fluid collection in the pelvis concerning for an
abscess or infected hematoma. Near complete removal of the fluid
collection following placement of the drain.
IMPRESSION: CT guided placement of a transgluteal pelvic drainage
catheter.  160 ml of cloudy red fluid was removed.

## 2008-06-16 IMAGING — CT CT PELVIS W/ CM
3 of 4 series · 16 of 32 positions shown, 19 images · IV contrast (150ml omni/300%)
Comparison: [DATE]

CLINICAL DATA: Follow-up pelvic abscess.  Status post hysterectomy.

CT PELVIS WITH CONTRAST
TECHNIQUE: Multidetector CT imaging of the pelvis was performed
following the standard protocol during administration of
intravenous contrast.
Contrast: 150 ml [NH]; no oral contrast was administered
per ordering physician

[Series 2: abd pelvis · axial · 0.77mm/px · z∈[-373,-298]mm · 2 of 46 slices shown, 5 images]
[im 16/46  soft-tissue]
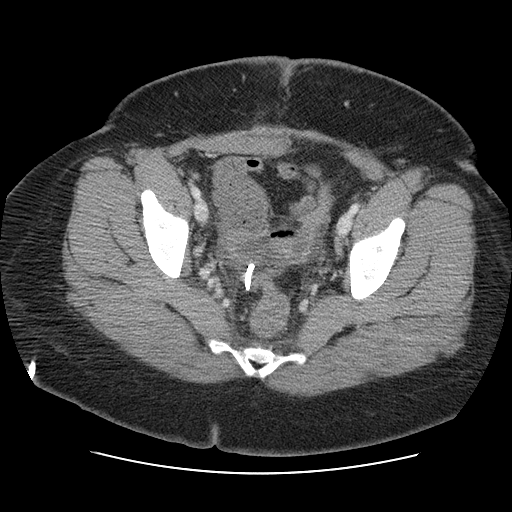
[im 16/46  lung]
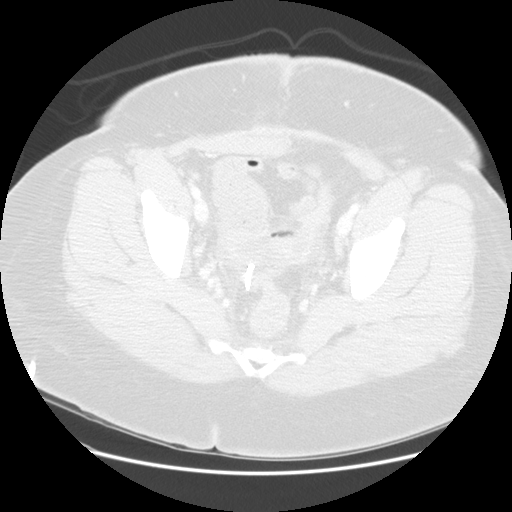
[im 16/46  bone]
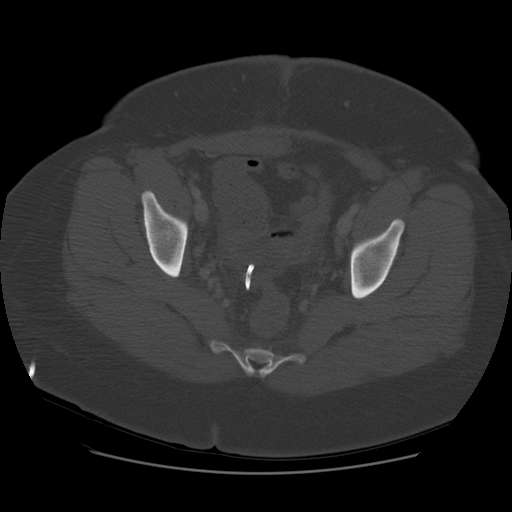
[im 31/46  soft-tissue]
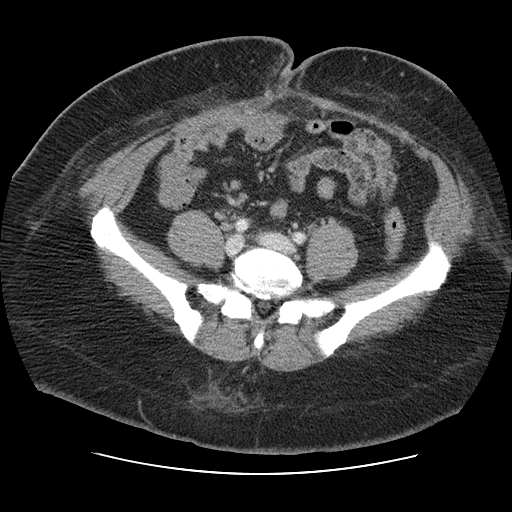
[im 31/46  lung]
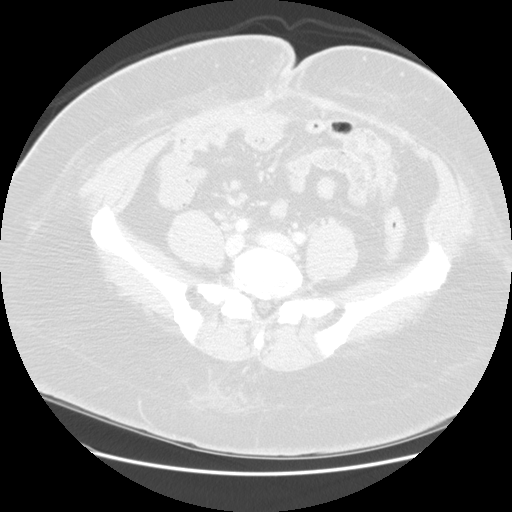

[Series 400: reformatted · coronal · 0.77mm/px · 6 of 171 slices shown (1 of 2)]
[im 16/171  soft-tissue]
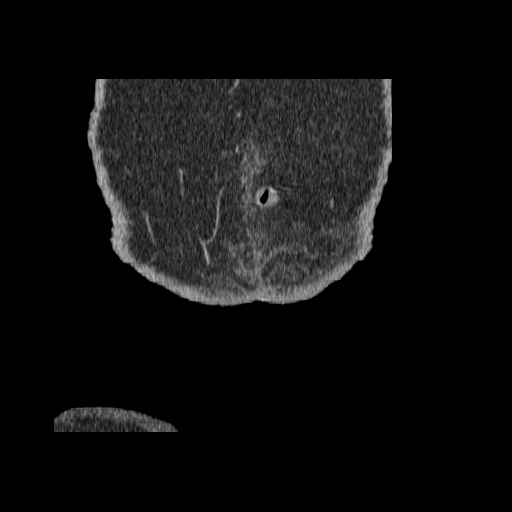
[im 31/171  soft-tissue]
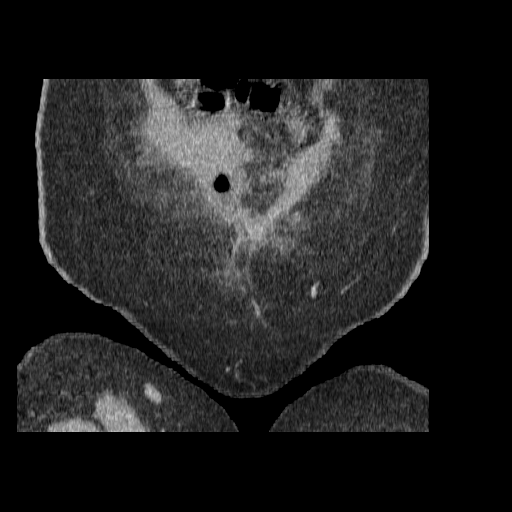
[im 62/171  soft-tissue]
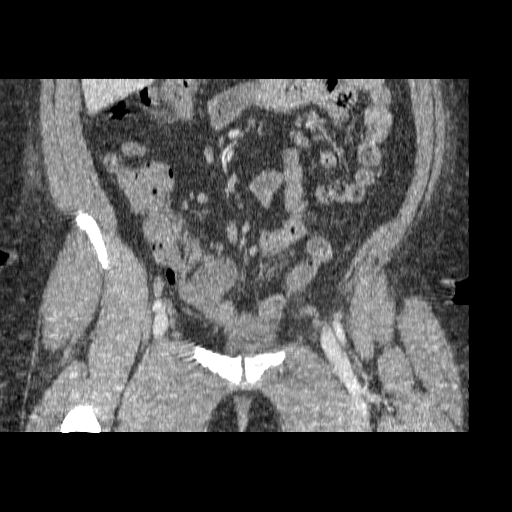
[im 78/171  soft-tissue]
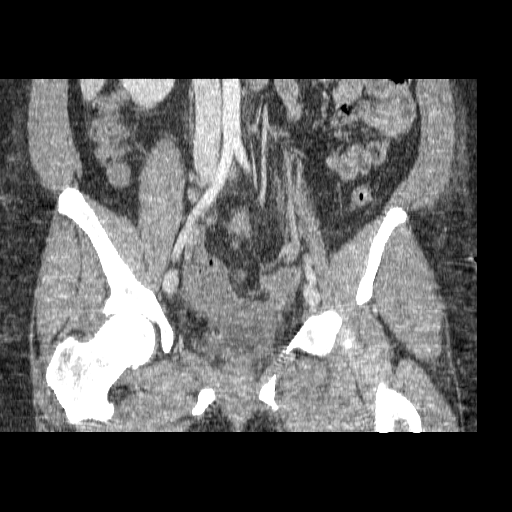
[im 93/171  soft-tissue]
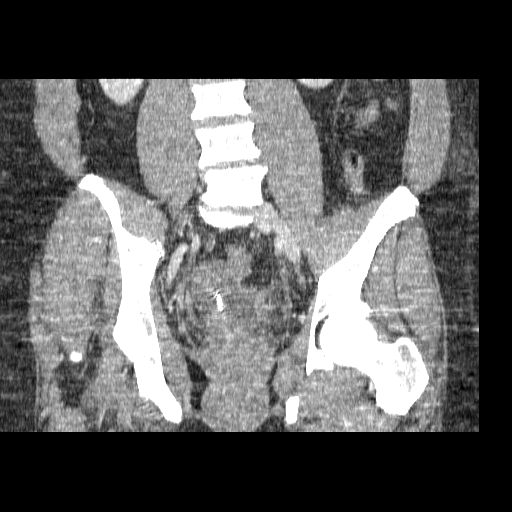
[im 109/171  soft-tissue]
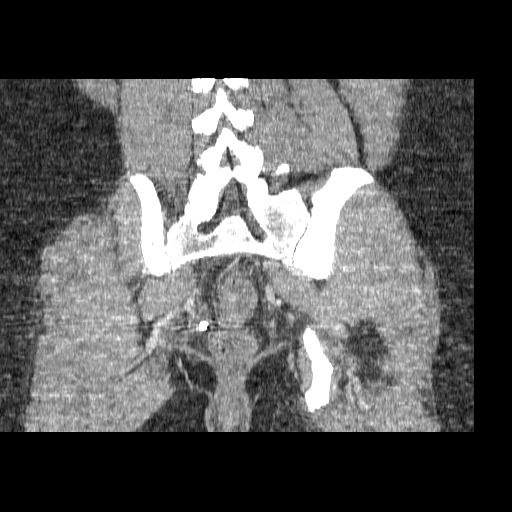

[Series 401: reformatted · sagittal · 0.77mm/px · 8 of 176 slices shown (2 of 2)]
[im 15/176  soft-tissue]
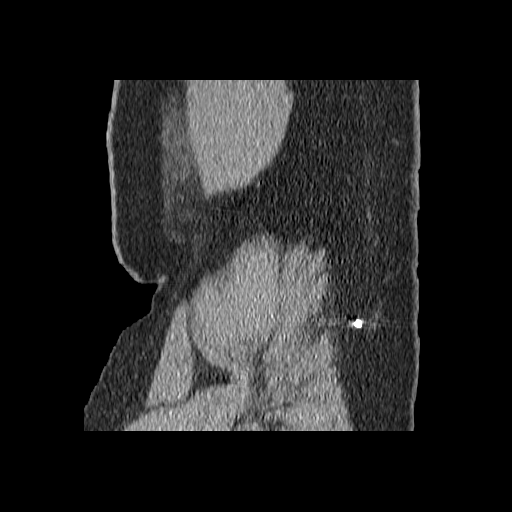
[im 44/176  soft-tissue]
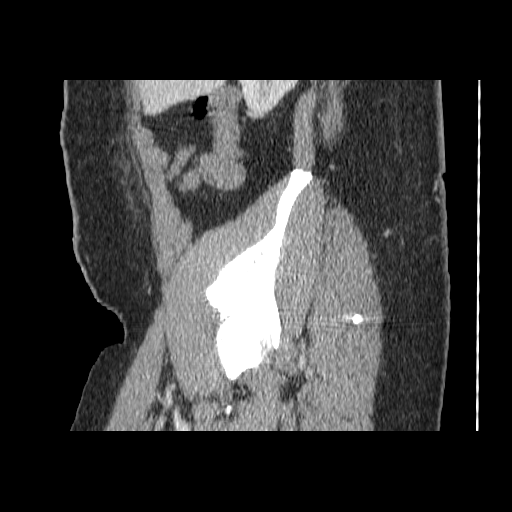
[im 59/176  soft-tissue]
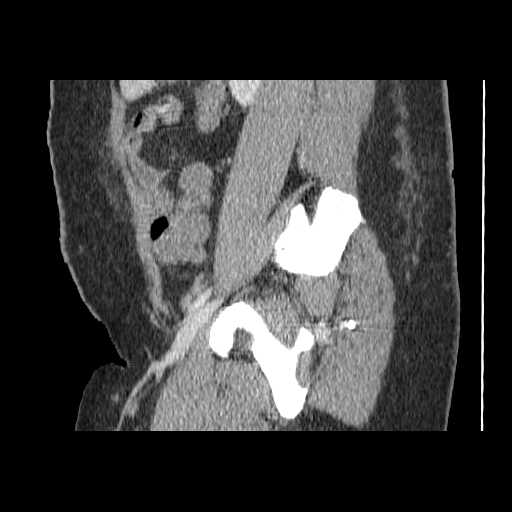
[im 73/176  soft-tissue]
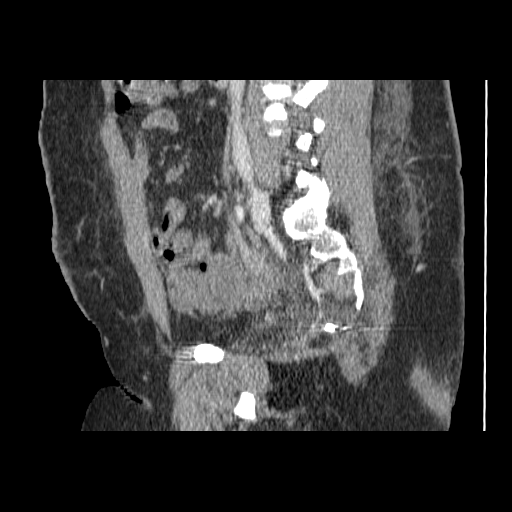
[im 103/176  soft-tissue]
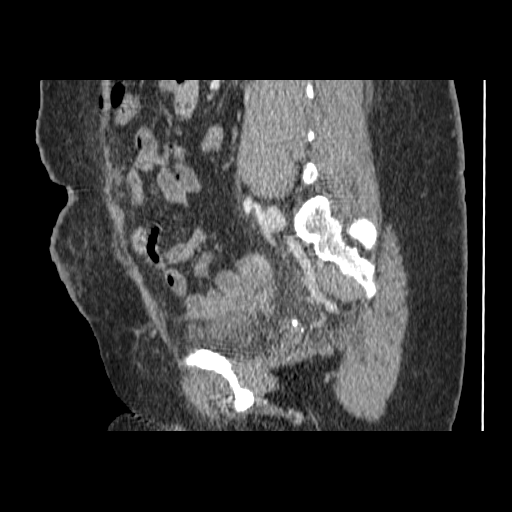
[im 117/176  soft-tissue]
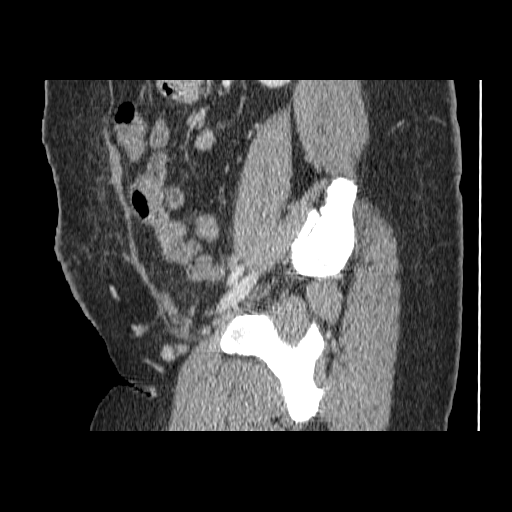
[im 132/176  soft-tissue]
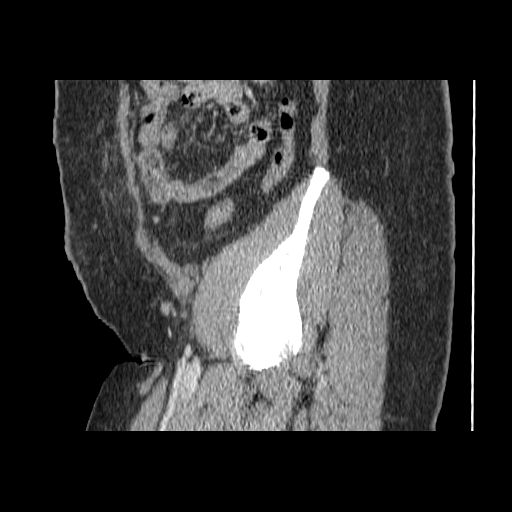
[im 161/176  soft-tissue]
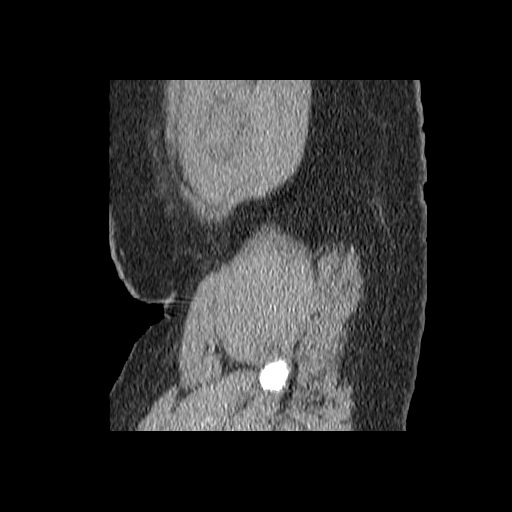

[16 of 32 positions shown; findings below may reference images not displayed]

FINDINGS: A transgluteal percutaneous drainage catheter is now seen
in place.  Decreased size of the fluid collection in the central
pelvic cul-de-sac is seen since prior study.  This now measures
x 3.7 cm compared to 8.0 x 6.3 cm on prior study.  No other
abnormal pelvic fluid collections are identified.

There is no evidence of dilated bowel loops or pelvic soft tissue
masses.  Dilated bowel loops seen on prior exam have now resolved,
consistent with resolving ileus.  Uterus is surgically absent.
IMPRESSION: 1.  Significant decrease in size of central pelvic fluid collection
in the cul-de-sac, with percutaneous drainage catheter in place.
2.  Resolving ileus.

## 2008-06-17 ENCOUNTER — Encounter: Payer: Self-pay | Admitting: Obstetrics & Gynecology

## 2008-06-17 IMAGING — XA IR BILIARY CATHETER EXCHANGE
1 series · 5 of 5 positions shown · non-contrast
Comparison: none

CLINICAL HISTORY: Pelvic fluid collection status post hysterectomy.
Percutaneous abscess drain has been pulled back.

[Series 1000: run · 0.16mm/px · 5 of 5 slices shown]
[im 1/5]
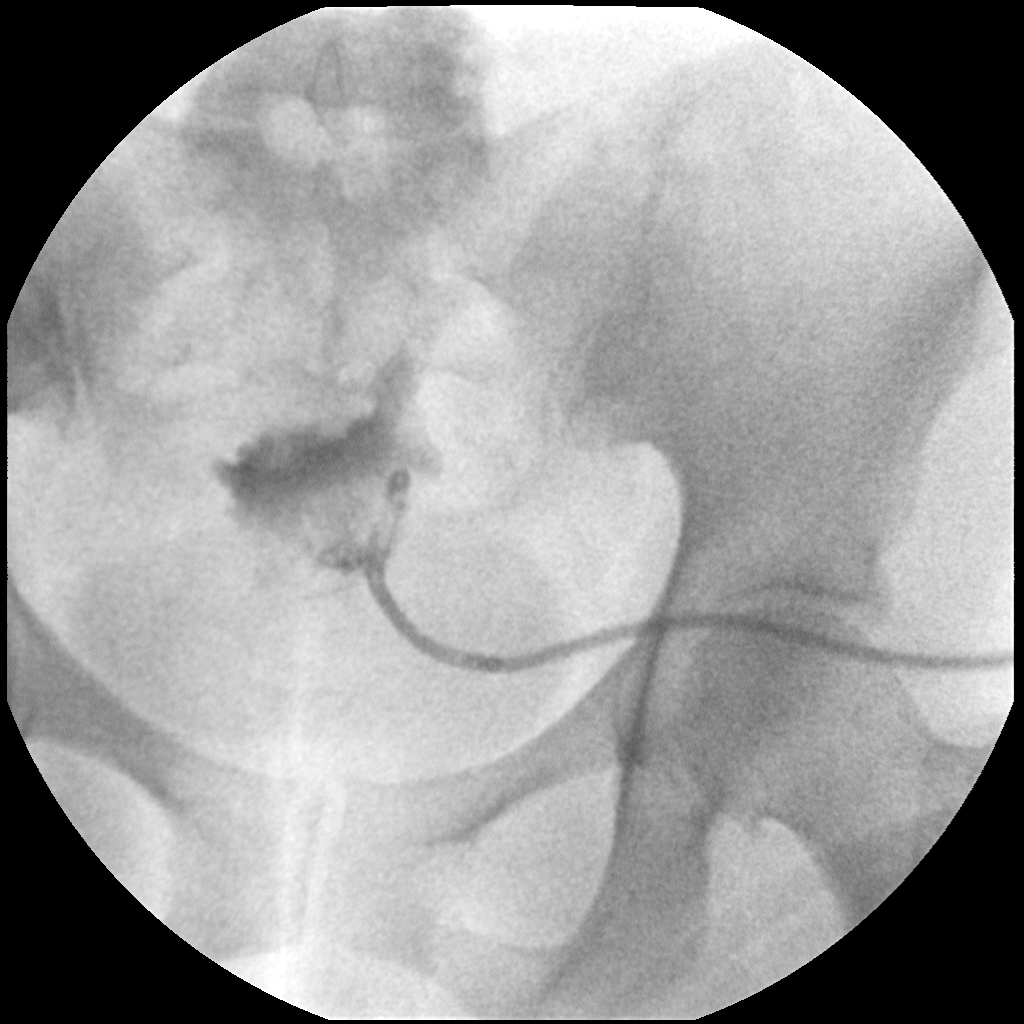
[im 2/5]
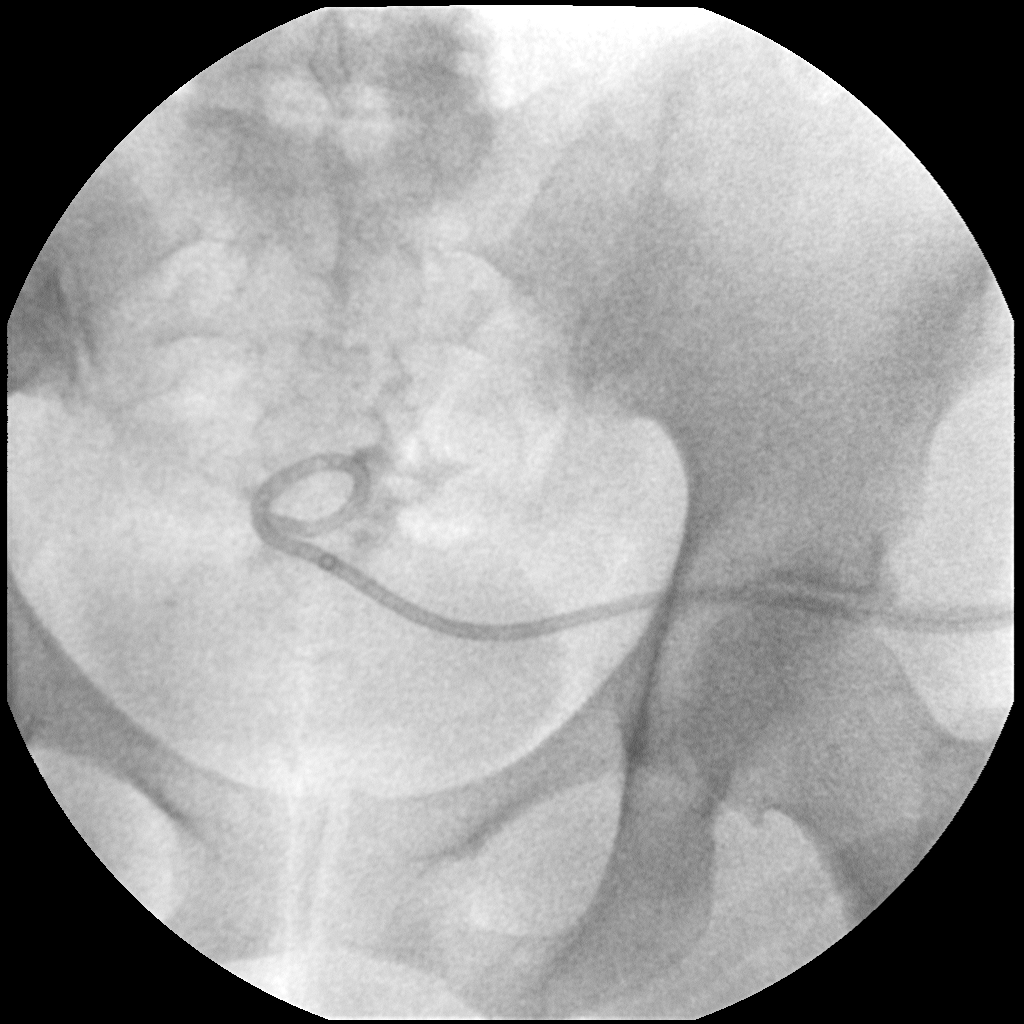
[im 3/5]
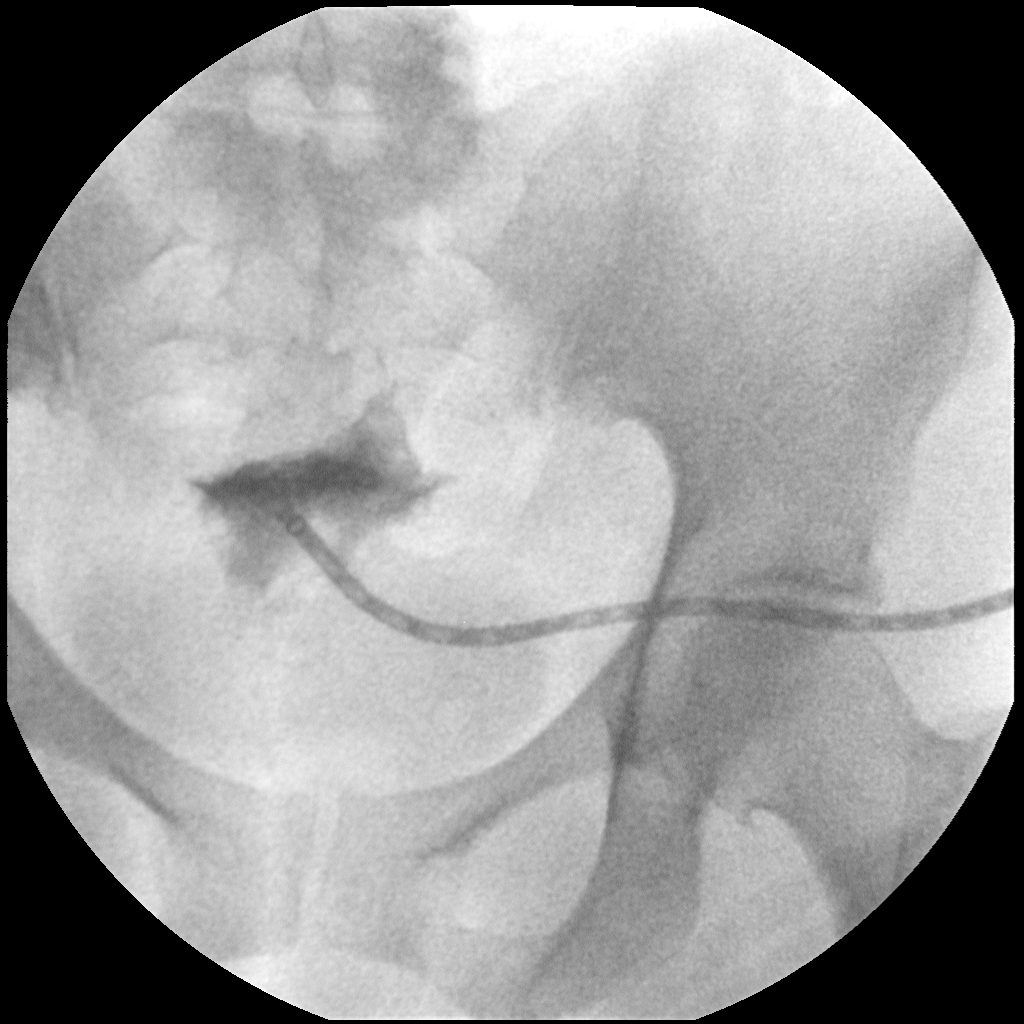
[im 4/5]
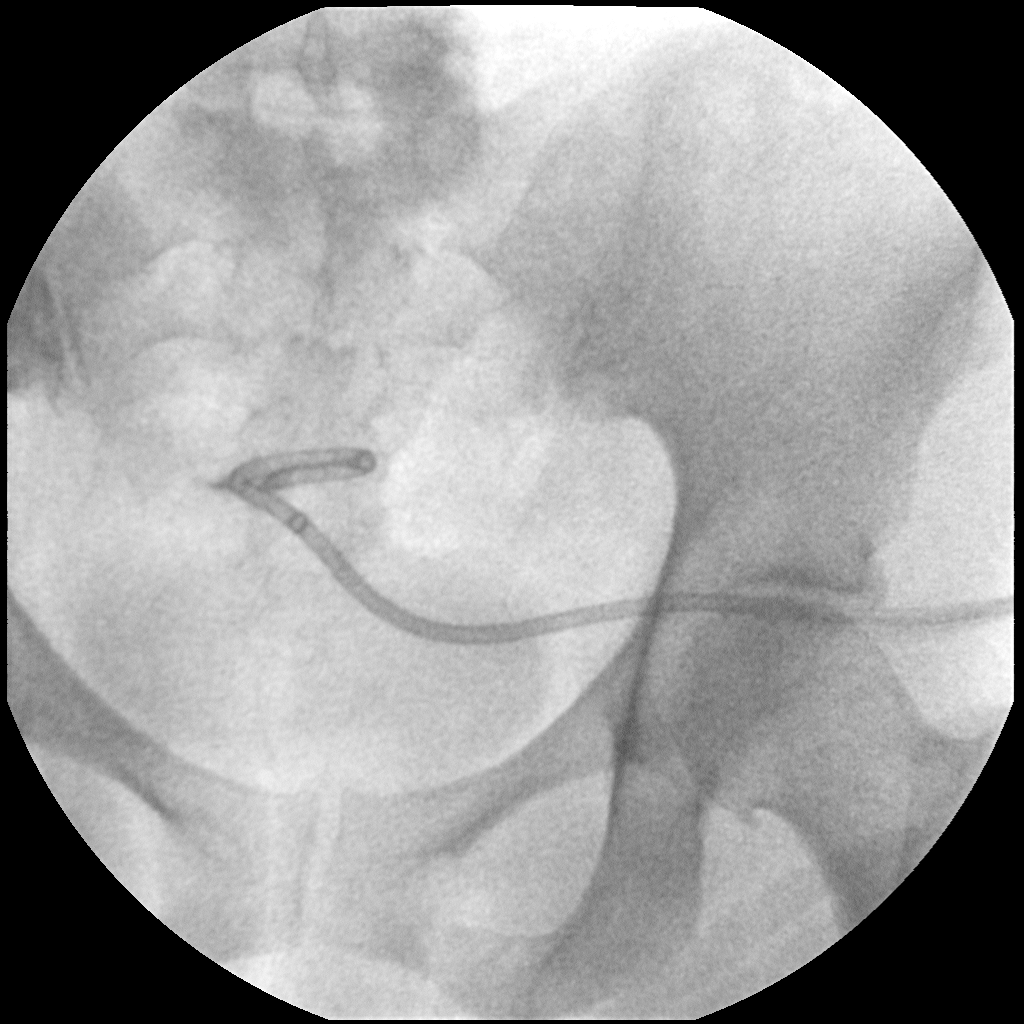
[im 5/5]
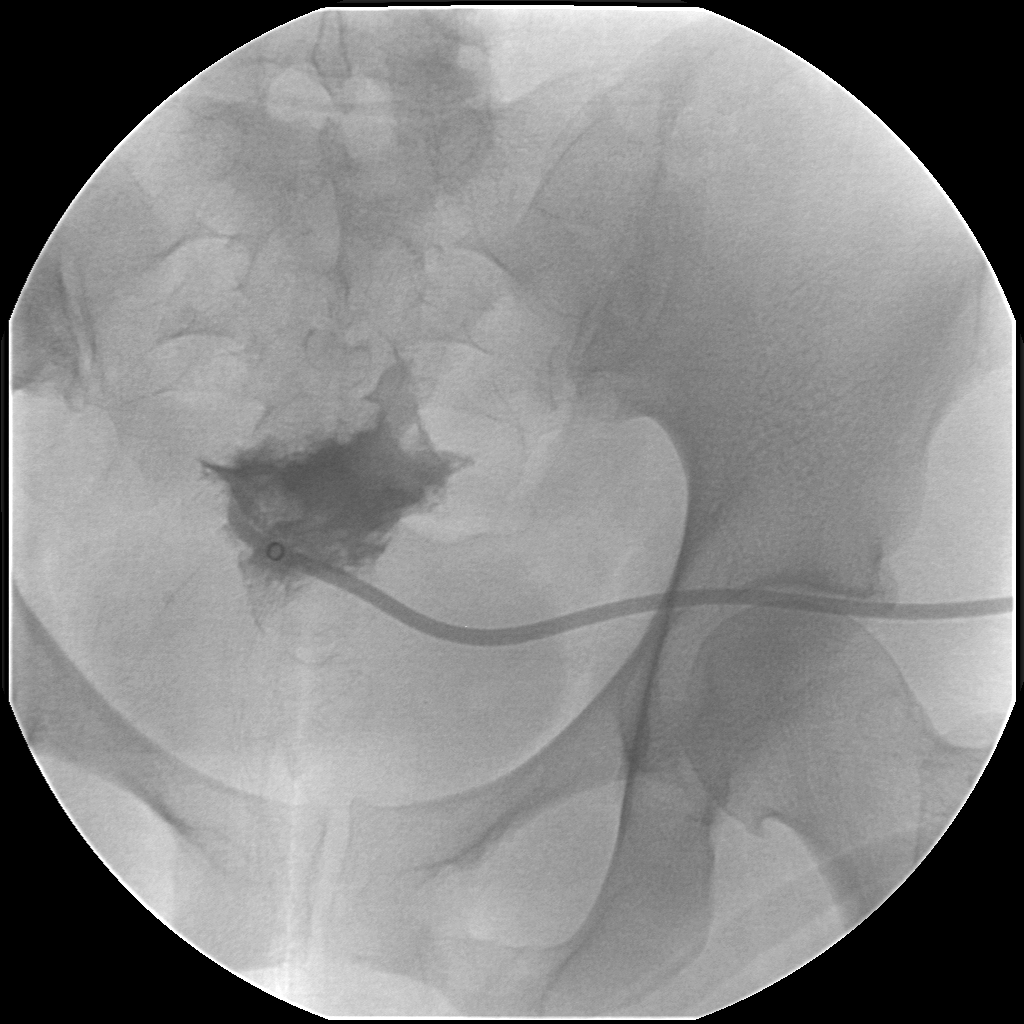

[5 of 5 positions shown; findings below may reference images not displayed]

PROCEDURE(S): REPOSITIONING AND UPSIZING OF PERCUTANEOUS ABSCESS
DRAIN.

Medications:Versed 2 mg, Fentanyl 100 mcg

Sedation time:15 minutes

Procedure:Informed consent was obtained for a catheter exchange.
The patient was placed prone on the interventional table.  The
existing right transgluteal drain was prepped and draped in a
sterile fashion.  Contrast injection demonstrated an adjacent fluid
pocket.  Catheter was cut and removed over a stiff Amplatz wire.  A
new 10-French drain was placed within this pocket.  Small blood
clots were aspirated from this catheter.  Due to the concern for
blood clots, the drain was exchanged for a 12-French all-purpose
drain over an Amplatz wire.  The catheter was reconstituted in the
collection.
FINDINGS: Placement of 12-French catheter in the remaining pelvic
fluid collection.
IMPRESSION: Repositioning and upsizing of the drainage catheter.
The patient now has a 12-French drainage catheter in the pelvis.

## 2008-06-25 ENCOUNTER — Encounter: Admission: RE | Admit: 2008-06-25 | Discharge: 2008-06-25 | Payer: Self-pay | Admitting: Diagnostic Radiology

## 2008-06-25 IMAGING — CT CT PELVIS W/ CM
3 series · 17 of 46 positions shown, 20 images · IV contrast (READICAT/WATER & [ID] OMNI 300)
Comparison: [DATE]

CLINICAL DATA: Status post right transgluteal drainage of pelvic
abscess after recent hysterectomy.

CT PELVIS WITH CONTRAST
TECHNIQUE: Multidetector CT imaging of the pelvis was performed
following the standard protocol during administration of
intravenous contrast.
Contrast: 100 ml [OL] IV

[Series 3: routine pelvis · axial · 0.97mm/px · z∈[-343,-128]mm · 13 of 49 slices shown, 16 images]
[im 4/49  soft-tissue]
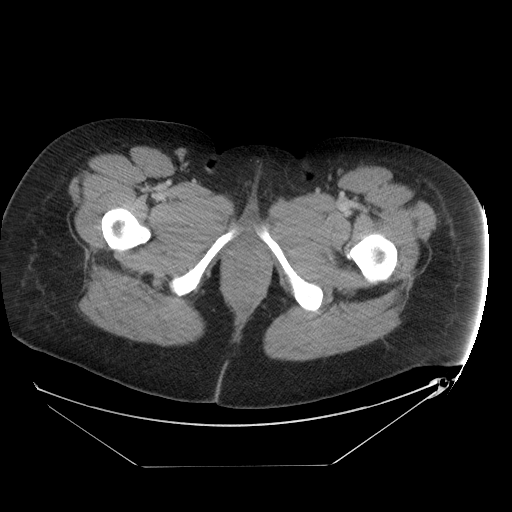
[im 4/49  bone]
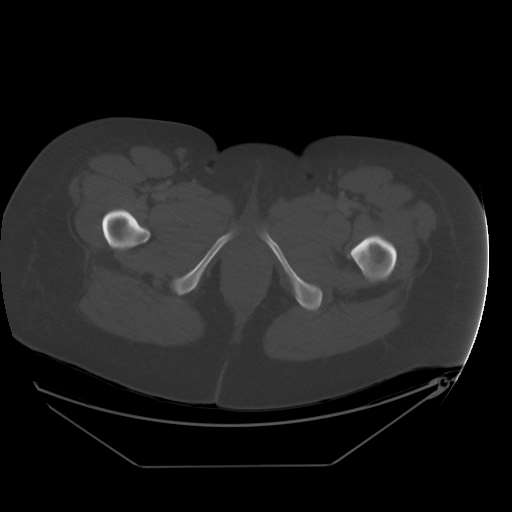
[im 8/49  soft-tissue]
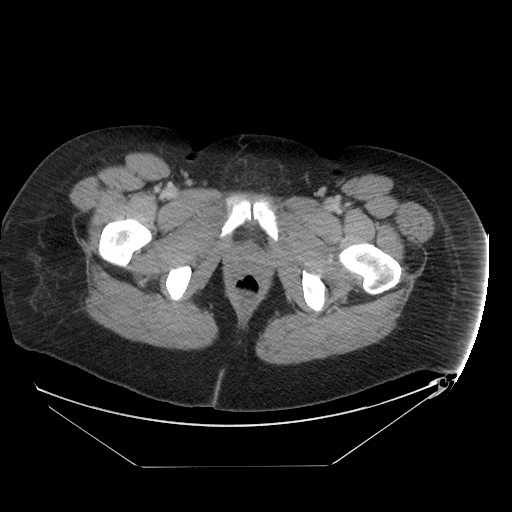
[im 13/49  soft-tissue]
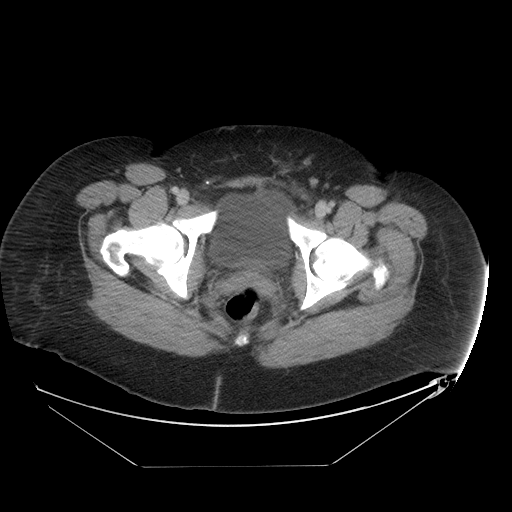
[im 18/49  soft-tissue]
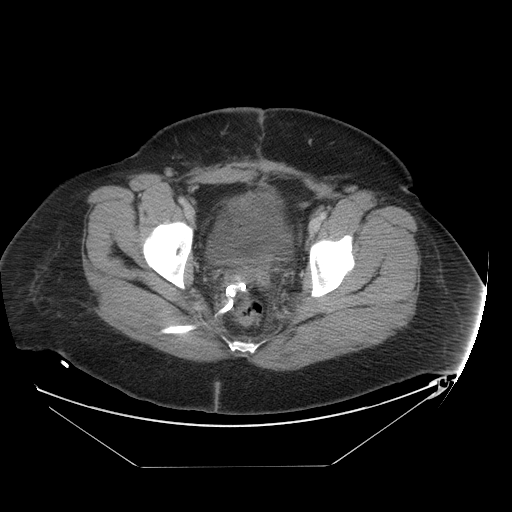
[im 22/49  soft-tissue]
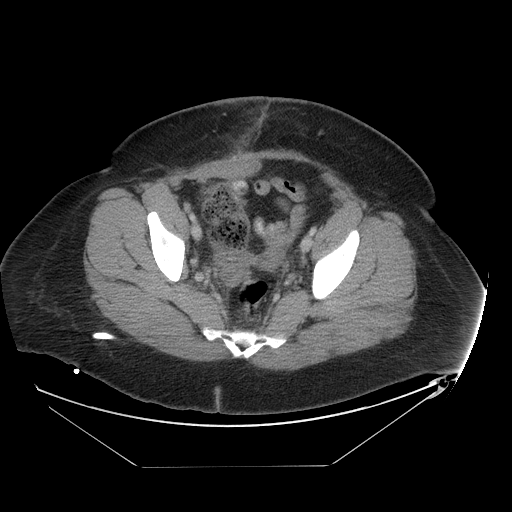
[im 27/49  soft-tissue]
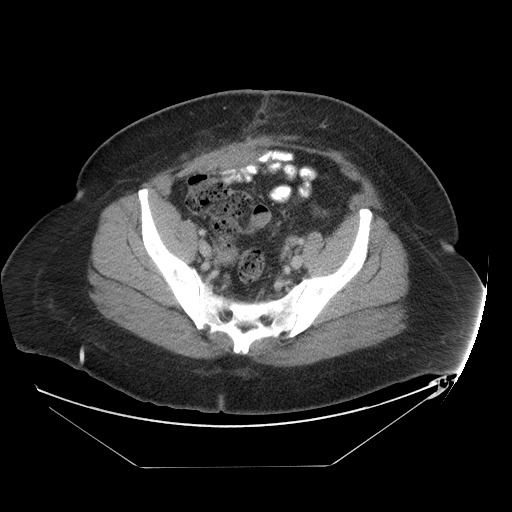
[im 31/49  soft-tissue]
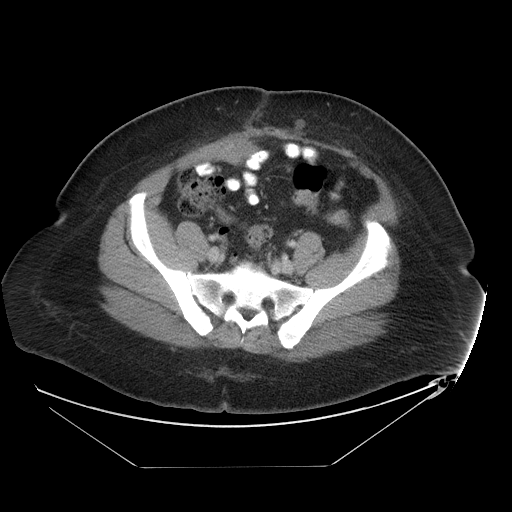
[im 36/49  soft-tissue]
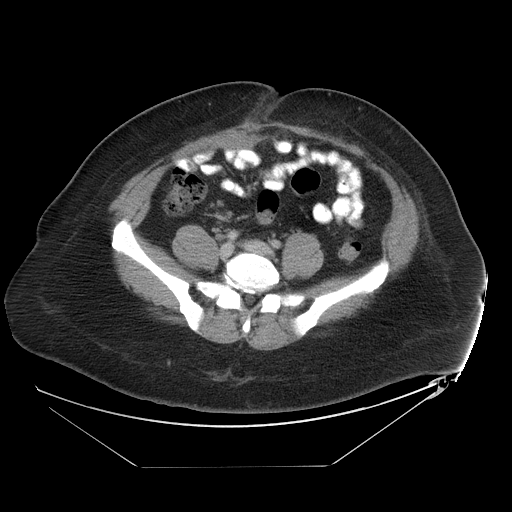
[im 41/49  soft-tissue]
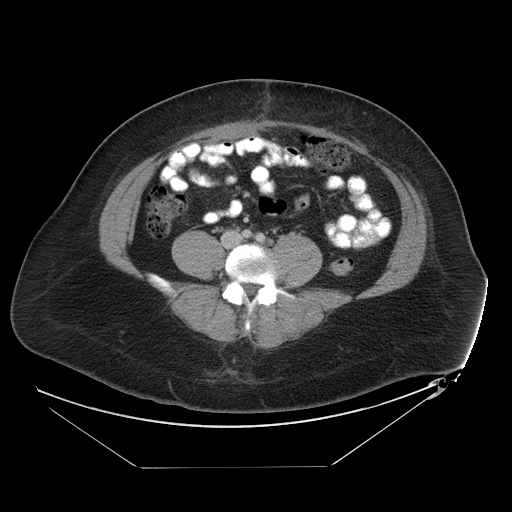
[im 41/49  bone]
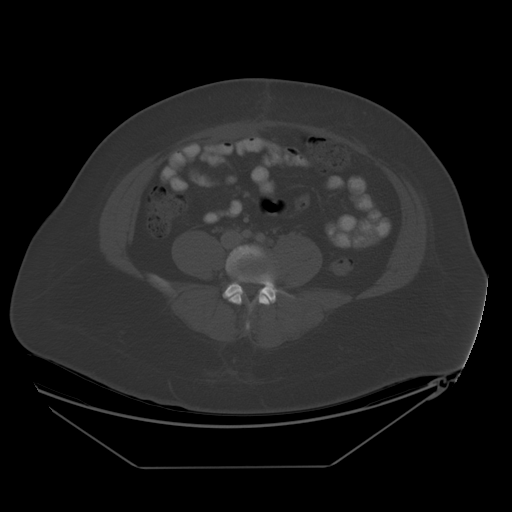
[im 42/49  lung]
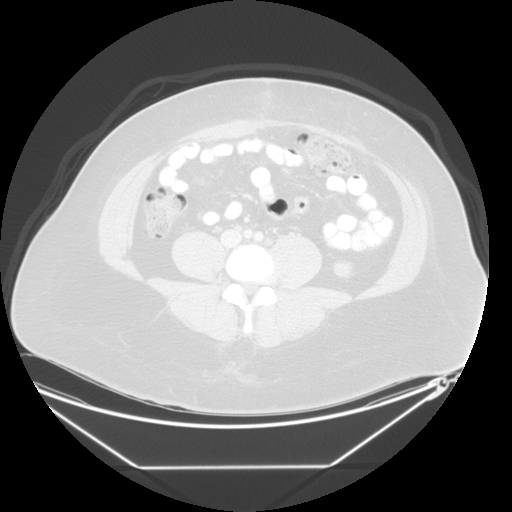
[im 44/49  lung]
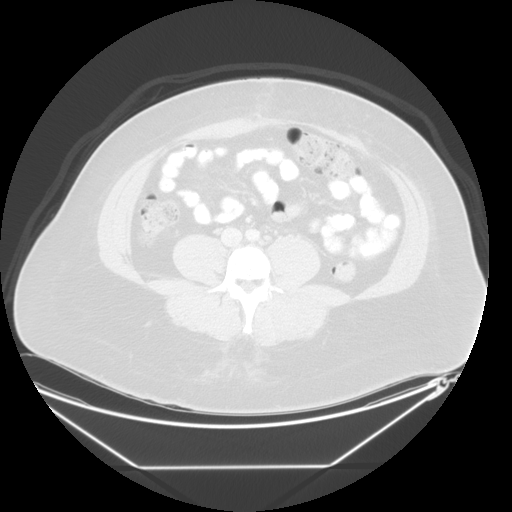
[im 45/49  soft-tissue]
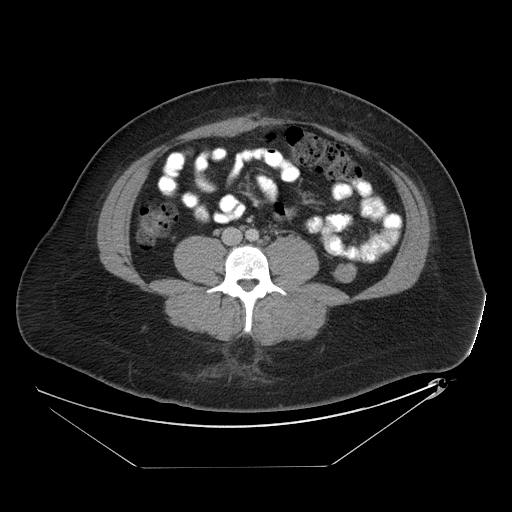
[im 45/49  lung]
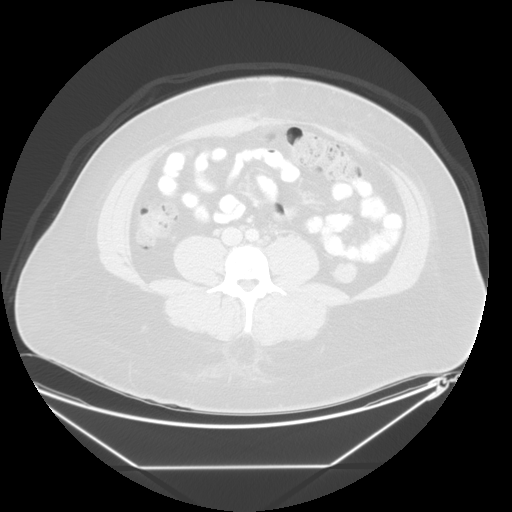
[im 47/49  lung]
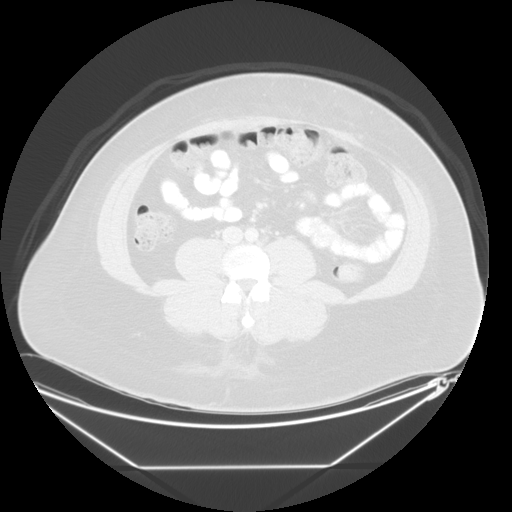

[Series 601: coronal body · coronal · 0.97mm/px · 1 of 136 slices shown]
[im 46/136  soft-tissue]
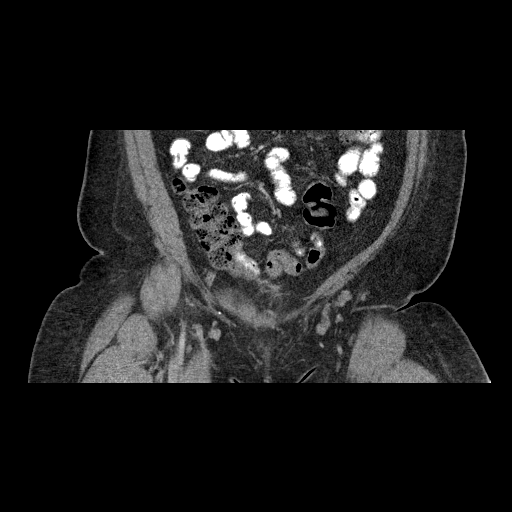

[Series 602: sagittal body · sagittal · 0.97mm/px · 3 of 192 slices shown]
[im 64/192  soft-tissue]
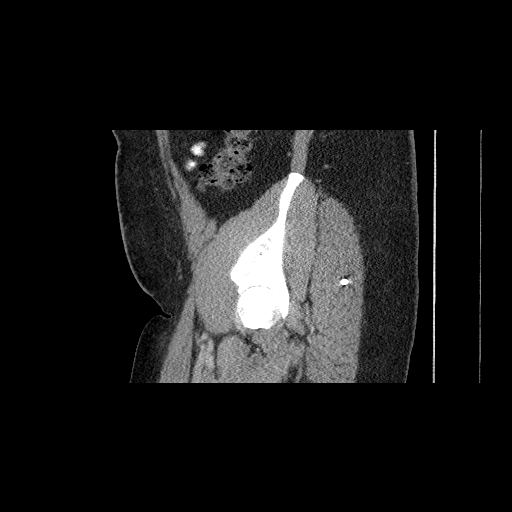
[im 85/192  soft-tissue]
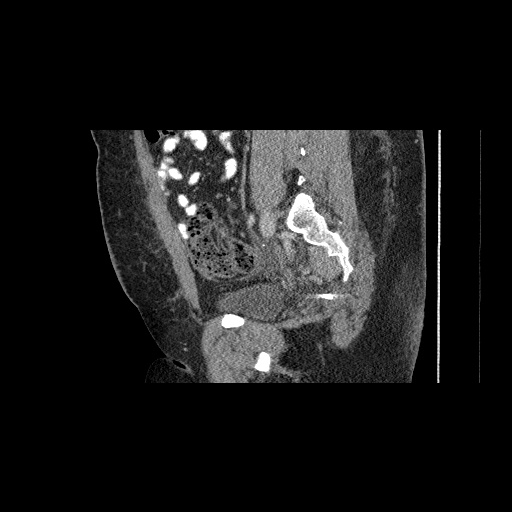
[im 107/192  soft-tissue]
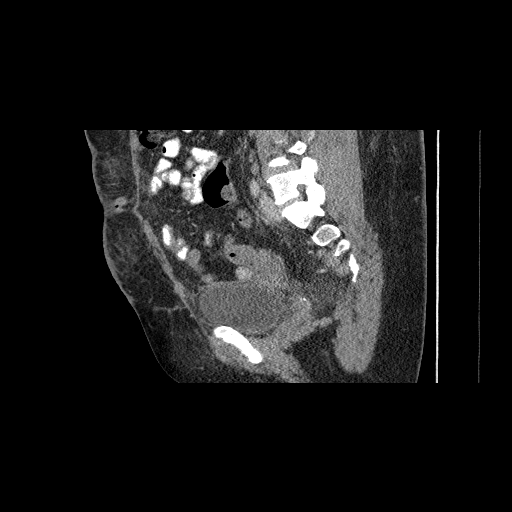

[17 of 46 positions shown; findings below may reference images not displayed]

FINDINGS: CT now demonstrates complete decompression of the abscess
fluid collection located just superior to the vaginal cuff.
Catheter remains in place.  No new collections are identified.  No
free fluid in the pelvis.  Bowel loops are unremarkable.  The
bladder has a normal appearance.
IMPRESSION: Complete resolution of pelvic abscess with percutaneous drain
remaining in place.  Additional office visit and drain removal was
performed and will be dictated in a separate office note.

## 2008-07-23 ENCOUNTER — Encounter: Admission: RE | Admit: 2008-07-23 | Discharge: 2008-07-23 | Payer: Self-pay | Admitting: Obstetrics & Gynecology

## 2010-11-14 ENCOUNTER — Encounter: Payer: Self-pay | Admitting: Obstetrics & Gynecology

## 2011-03-08 NOTE — Discharge Summary (Signed)
NAMEENJOLI, TIDD NO.:  [id number]   MEDICAL RECORD NO.:  [id number]          PATIENT TYPE:  INP   LOCATION:  9302                          FACILITY:  [facility]   PHYSICIAN:  Roseanna Rainbow, M.D.DATE OF BIRTH:  [birth year]   DATE OF ADMISSION:  06/05/2008  DATE OF DISCHARGE:  06/07/2008                               DISCHARGE SUMMARY   CHIEF COMPLAINT:  The patient is a 45 year old with symptomatic uterine  fibroids who presents for a total abdominal hysterectomy.  Please see  the dictated history and physical for further details.   HOSPITAL COURSE:  The patient was admitted and underwent a total  abdominal hysterectomy and lysis of adhesions.  Please see the dictated  operative summary.  On postoperative day #1, her hemoglobin was 10.3 and  white blood cell count was 17,600.  The basic metabolic profile was  normal.  On postoperative day #2, she was tolerating a regular diet and  she was discharged to home.  She had also been afebrile.   DISCHARGE DIAGNOSES:  Uterine fibroids and adhesions.   PROCEDURES:  Total abdominal hysterectomy and lysis of adhesions.   CONDITION:  Stable.   DIET:  Regular.   ACTIVITY:  Pelvic rest, progressive activity.   MEDICATIONS:  Percocet.   DISPOSITION:  The patient is to follow up in the office for staple  removal in 1 week.      Roseanna Rainbow, M.D.  Electronically Signed     LAJ/MEDQ  D:  07/15/2008  T:  07/15/2008  Job:  [id number]

## 2011-03-08 NOTE — Discharge Summary (Signed)
Michele Garcia, DUKE NO.:  [id number]   MEDICAL RECORD NO.:  [id number]          PATIENT TYPE:  INP   LOCATION:  9307                          FACILITY:  [facility]   PHYSICIAN:  Roseanna Rainbow, M.D.DATE OF BIRTH:  [birth year]   DATE OF ADMISSION:  06/10/2008  DATE OF DISCHARGE:  06/18/2008                               DISCHARGE SUMMARY   CHIEF COMPLAINT:  The patient is a 45 year old status post a total  abdominal hysterectomy for uterine fibroids on June 05, 2008, now  complaining of fever.   HISTORY OF PRESENT ILLNESS:  Per the patient reports, having a low grade  fever at home since discharge from the hospital.  She denies any pain,  dysuria, nausea, or vomiting.   PAST SURGICAL HISTORY:  Please see the above.  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   PAST MEDICAL HISTORY:  Narcolepsy.   MEDICATIONS:  See the medication reconciliation form.   ALLERGIES:  No known drug allergies.   SOCIAL HISTORY:  She denies any tobacco, ethanol, or drug use.   PHYSICAL EXAMINATION:  VITAL SIGNS:  Temperature 101.  LUNGS:  Clear to auscultation.  HEART:  Regular rate and rhythm.  ABDOMEN:  Soft, nontender.  Incision clean.  EXTREMITIES:  No calf tenderness appreciated.   ASSESSMENT AND PLAN:  Postoperative fever, questionable etiology.  Admission and workup for fever.   HOSPITAL COURSE:  The patient was admitted.  She was started on broad-  spectrum parenteral antibiotics.  In the first 24 hours, her T-max was  102.  Blood cultures were obtained.  A CT of the abdomen and pelvis was  suspect for possible abscess and a percutaneous drain was placed by  interventional radiology.  Of note, her hemoglobin was 7.9 and she was  asymptomatic.  Her leukocytosis gradually improved with her white count  was initially 26,000 and this decreased to 12,000-14,000.  A repeat CT  scan on June 16, 2008,  demonstrated that the drain was not in the  optimal position.  On June 17, 2008, the drain was repositioned by  interventional radiology.  She continued to improve clinically.  On  June 18, 2008, she was discharged to home.   DISCHARGE DIAGNOSES:  Postoperative fever, pelvic abscess status post  total abdominal hysterectomy, and anemia.   PROCEDURES:  Percutaneous drain placement per interventional radiology.   CONDITION:  Stable.   DIET:  Regular.   ACTIVITY:  Ad lib.   DISPOSITION:  An advanced home health referral was made for drain  management.  She also had a followup with interventional radiology on  June 25, 2008, at 12:45 p.m.  She was to follow up with [facility] in 1 week.      Roseanna Rainbow, M.D.  Electronically Signed     LAJ/MEDQ  D:  07/15/2008  T:  07/15/2008  Job:  [id number]   cc:   Arn Medal, MD  Fax: [phone]

## 2011-03-08 NOTE — Op Note (Signed)
Michele Garcia, GROSECLOSE NO.:  [id number]   MEDICAL RECORD NO.:  [id number]          PATIENT TYPE:  INP   LOCATION:  9302                          FACILITY:  [facility]   PHYSICIAN:  Roseanna Rainbow, M.D.DATE OF BIRTH:  1966/01/24   DATE OF PROCEDURE:  06/05/2008  DATE OF DISCHARGE:                               OPERATIVE REPORT   PREOPERATIVE DIAGNOSIS:  Symptomatic uterine fibroids.   POSTOPERATIVE DIAGNOSES:  1. Symptomatic uterine fibroids.  2. Bowel adhesions.  3. Anterior cul-de-sac adhesions.   SURGEONS:  Roseanna Rainbow, MD. and Bing Neighbors. Clearance Coots, MD.   INTRAOPERATIVE CONSULT:  Per Leonie Man, MD.   PROCEDURES:  Total abdominal hysterectomy and lysis of adhesions.   ANESTHESIA:  General endotracheal.   ESTIMATED BLOOD LOSS:  400 mL.   COMPLICATIONS:  None.   IV FLUID AND URINE OUTPUT:  As per anesthesiology.   FINDINGS:  The uterus measured approximately 12 cm in the sagittal  diameter and was irregular in contour secondary to the multiple myomas.  There was a right-sided paratubal cyst approximately 2 cm in diameter.  The ovaries were normal appearing.  There was a loop of small bowel  adherent to the parietal peritoneum of the anterior abdominal wall.  There were also filmy bowel adhesions to the parietal peritoneum of the  pelvis on the left side as well as to the right cornual region and right  adnexa.  The bladder was densely adherent to the lower uterine segment  and cervix.   DESCRIPTION OF PROCEDURE:  The patient was taken to the operating room  with an IV running.  She was given general anesthesia.  She was placed  in the dorsal lithotomy position and prepped and draped in the usual  sterile fashion.  After a time-out had been completed, a midline  vertical incision was then made with the scalpel.  This was carried down  through the underlying fascia.  The fascia was incised along the length  of the incision.  The parietal  peritoneum was tented up and entered  sharply.  This incision was then extended superiorly and inferiorly.  At  this point, the loop of small bowel was freed from the anterior  abdominal wall parietal peritoneum.  The Bookwalter retractor was then  set up and put into place.  The bowel was packed away with moistened  laparotomy sponges.  The other above-noted bowel adhesions were then  dissected using sharp and blunt dissection.  Long Kelly clamps were then  placed on the cornua for traction.  The round ligaments were then  divided laterally using the Bovie.  A window was made in the broad  ligament bilaterally and the utero-ovarian ligaments and fallopian tubes  were doubly clamped with parametrial clamps and transected.  The  pedicles were then secured with free ligatures of 0 Vicryl and suture  ligated with sutures of the same.  At this point, the broad ligament  peritoneum was then incised to the midline bilaterally.  The bladder was  then dissected away from the low uterine segment of cervix using again  both  sharp and blunt dissection.  Parametrial clamps were then placed on  the uterine vessels bilaterally.  These were transected.  The pedicles  were then secured with suture ligatures.  At this point,  the uterine  fundus was then amputated using the Bovie above the left level of the  uterine pedicle.  The cardinal ligaments and uterosacral ligaments were  then clamped with a parametrial clamps, transected and suture ligated.  The remainder of the cervix was then amputated.  The remainder of the  vaginal cuff was then closed with figure-of-eight sutures of 0 Vicryl.  At this point, the bladder was filled with sterile milk.  There were no  defects noted.  The pelvis was then copiously irrigated.  At this point,  Dr. Lurene Shadow inspected the superior aspect of the small bowel that had  been attached to the right upper peritoneum of the anterior abdominal  wall and she felt that the bowel  had not been injured.  At this point,  all the retractors and packs were removed from the abdomen.  The fascial  incision was then reapproximated in a running fashion using 0 PDS.  The  skin was closed with staples.  At the close of the procedure, the  instrument and pack counts were said to be correct x2.  The patient was  taken to the PACU awake and in stable condition.      Roseanna Rainbow, M.D.  Electronically Signed     LAJ/MEDQ  D:  06/05/2008  T:  06/06/2008  Job:  [id number]

## 2011-03-08 NOTE — H&P (Signed)
NAMEOLIVER, NEUWIRTH NO.:  [id number]   MEDICAL RECORD NO.:  [id number]          PATIENT TYPE:  AMB   LOCATION:  SDC                           [facility]ACILITY:  [facility]   PHYSICIAN:  Roseanna Rainbow, M.D.DATE OF BIRTH:  [birth year]   DATE OF ADMISSION:  DATE OF DISCHARGE:                              HISTORY & PHYSICAL   CHIEF COMPLAINT:  The patient is a 45 year old with symptomatic uterine  fibroids who presents for total abdominal hysterectomy.   HISTORY OF PRESENT ILLNESSS:  The patient gives a history of abnormal  uterine bleeding.  Attempts have been made to manage the bleeding with  medically including Depo-Provera and combination oral contraceptives.  Workup to date has included a hemoglobin from May of 2009 that was 11.  A pap smear in September 2008 was negative.  A pelvic ultrasound from  September 2008 demonstrated a uterus with a sagittal length of 11.3 cm,  and there were multiple myomas.  There is a right adnexal simple  appearing cyst 3 cm in diameter or possible paraovarian cyst.   PAST OBSTETRICAL HISTORY:  She has been pregnant 3 times.  She has had 2  live births.  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  This was complicated by a questionable anesthesia  reaction.   PAST MEDICAL HISTORY:  Includes:  1. Narcolepsy.  2. Questionable history of chronic hypertension.   FAMILY HISTORY:  Breast cancer, myocardial infarction, CVA, diabetes,  hypertension.   MEDICATIONS:  Please see the medication reconciliation form.   ALLERGIES:  No known drug allergies.   REVIEW OF SYSTEMS:  GENITOURINARY:  Please see the above.   PHYSICAL EXAMINATION:  VITAL SIGNS:  Temperature 98.3, blood pressure  139/88, weight 242 pounds.  GENERAL:  Well-developed, well-nourished, no apparent distress.  LUNGS:  Clear to auscultation bilaterally.  HEART:  Regular rate and rhythm.  ABDOMEN:  Nontender.  No organomegaly.  PELVIC EXAM:  Normal EGBUS on speculum exam.  The cervix is without  lesions.  No abnormal discharge.  On bimanual exam, the uterus is  moderately enlarged, approximately 12 weeks aggregate size, irregular.   ASSESSMENT:  Uterine fibroids with secondary menometrorrhagia refractory  to attempts to manage medically.  Also with a history of an abdominal  mesh.  The planned procedure is total abdominal hysterectomy.  Will  collaborate the surgery with Dr. Leonie Man for possible replacement  of the abdominal mesh.  The risks, benefits and alternative forms of  management have been reviewed with the patient, and informed consent had  been obtained.      Roseanna Rainbow, M.D.  Electronically Signed     LAJ/MEDQ  D:  06/04/2008  T:  06/04/2008  Job:  [id number]

## 2011-05-23 ENCOUNTER — Emergency Department (HOSPITAL_COMMUNITY)
Admit: 2011-05-23 | Discharge: 2011-05-24 | Disposition: A | Discharge status: Home / Self Care | Payer: PRIVATE HEALTH INSURANCE | Attending: Emergency Medicine | Admitting: Emergency Medicine

## 2011-05-23 DIAGNOSIS — I10 Essential (primary) hypertension: Secondary | ICD-10-CM | POA: Insufficient documentation

## 2011-05-23 DIAGNOSIS — J329 Chronic sinusitis, unspecified: Secondary | ICD-10-CM | POA: Insufficient documentation

## 2011-05-23 DIAGNOSIS — H538 Other visual disturbances: Secondary | ICD-10-CM | POA: Insufficient documentation

## 2011-05-23 DIAGNOSIS — R51 Headache: Secondary | ICD-10-CM | POA: Insufficient documentation

## 2011-05-23 DIAGNOSIS — R42 Dizziness and giddiness: Secondary | ICD-10-CM | POA: Insufficient documentation

## 2011-05-23 DIAGNOSIS — R221 Localized swelling, mass and lump, neck: Secondary | ICD-10-CM | POA: Insufficient documentation

## 2011-05-23 DIAGNOSIS — R220 Localized swelling, mass and lump, head: Secondary | ICD-10-CM | POA: Insufficient documentation

## 2011-05-24 ENCOUNTER — Emergency Department (HOSPITAL_COMMUNITY)
Admit: 2011-05-24 | Discharge: 2011-05-24 | Disposition: A | Discharge status: Home / Self Care | Payer: PRIVATE HEALTH INSURANCE | Source: Home / Self Care | Attending: Emergency Medicine | Admitting: Emergency Medicine

## 2011-05-24 LAB — POCT I-STAT, CHEM 8
Chloride: 102 mEq/L (ref 96–112)
Glucose, Bld: 92 mg/dL (ref 70–99)
HCT: 36 % (ref 36.0–46.0)
Hemoglobin: 12.2 g/dL (ref 12.0–15.0)
Potassium: 3.5 mEq/L (ref 3.5–5.1)
Sodium: 140 mEq/L (ref 135–145)

## 2011-05-24 IMAGING — CT CT HEAD W/O CM
1 series · 16 of 30 positions shown, 20 images · non-contrast
Comparison: None.

CLINICAL DATA: Headache and dizziness

CT HEAD WITHOUT CONTRAST
TECHNIQUE: Contiguous axial images were obtained from the base of
the skull through the vertex without contrast

[Series 2: head routine 4.8 h37s · axial · 0.43mm/px · z∈[+1236,+1366]mm · 16 of 30 slices shown, 20 images]
[im 2/30  brain]
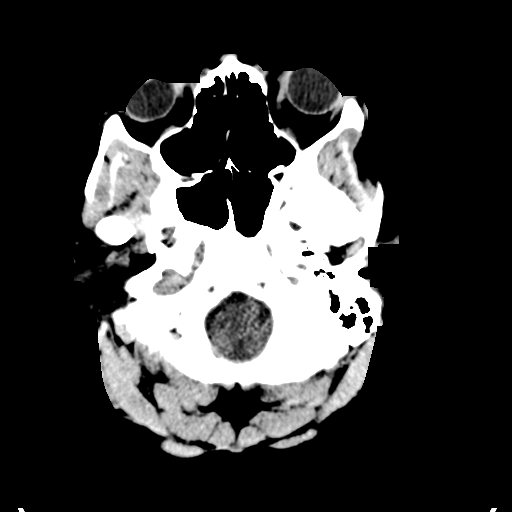
[im 2/30  bone]
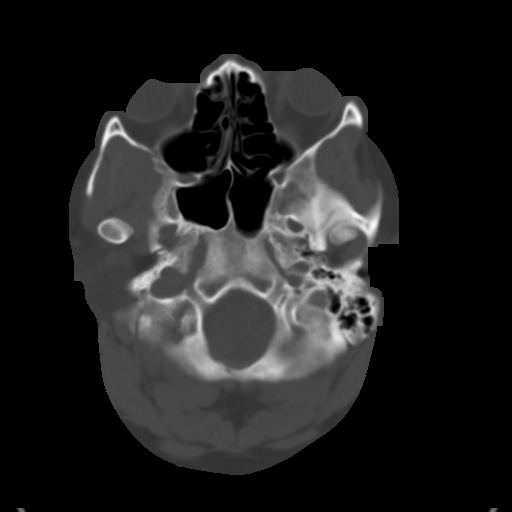
[im 4/30  brain]
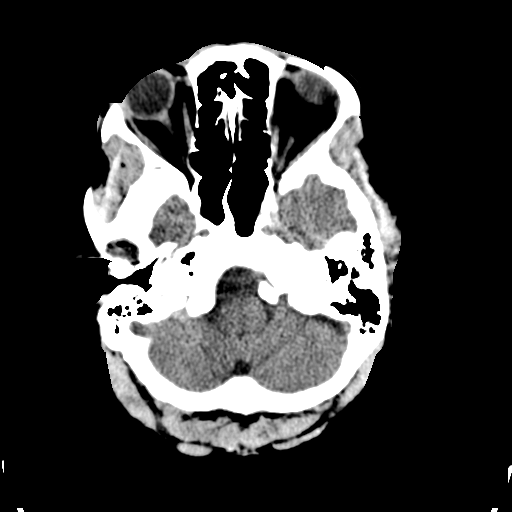
[im 6/30  brain]
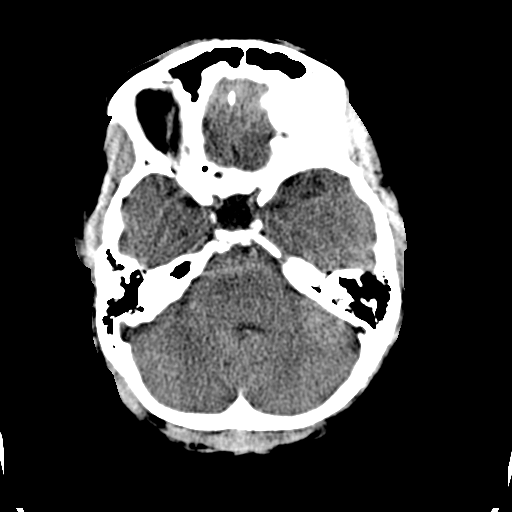
[im 8/30  brain]
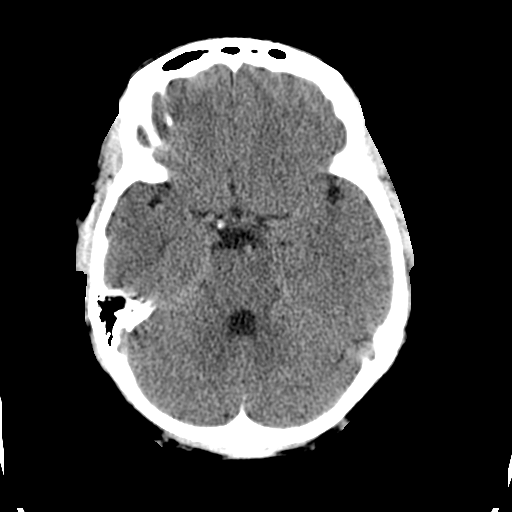
[im 9/30  brain]
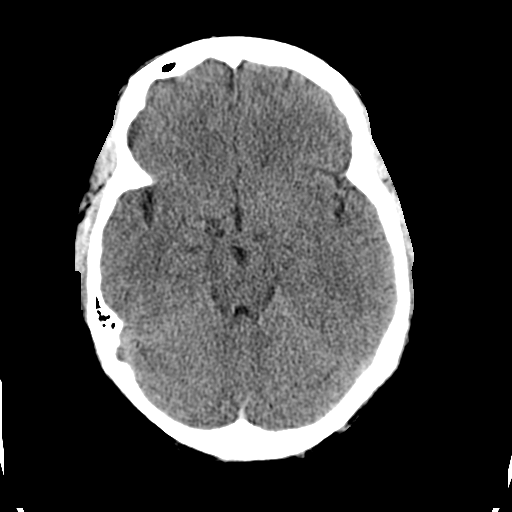
[im 9/30  bone]
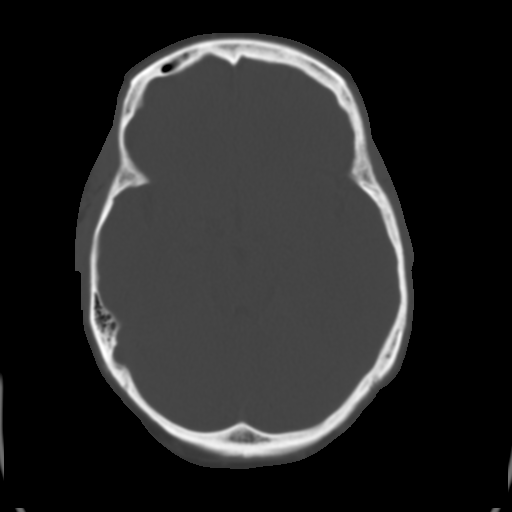
[im 11/30  brain]
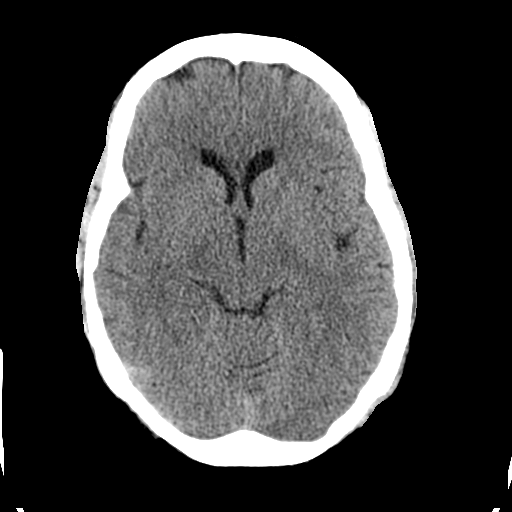
[im 13/30  brain]
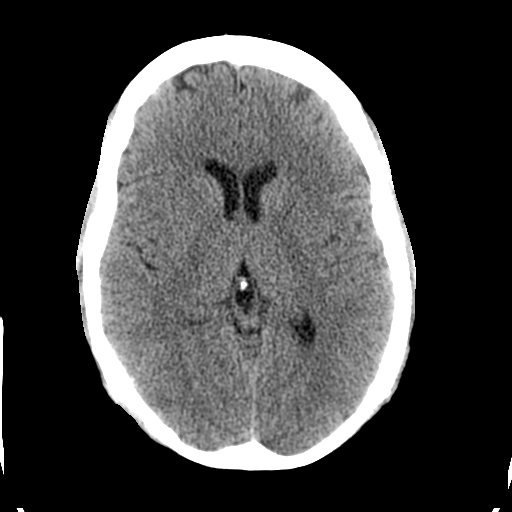
[im 15/30  brain]
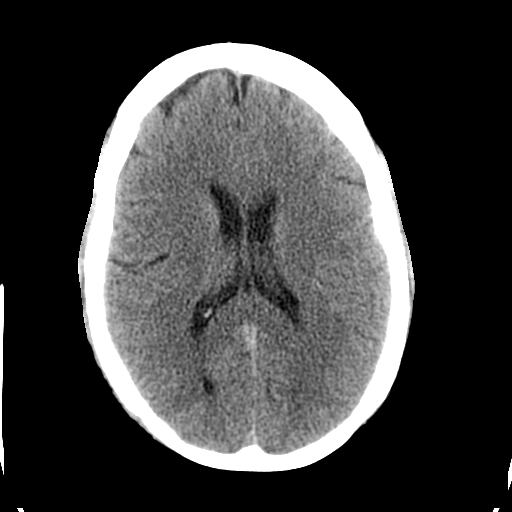
[im 16/30  brain]
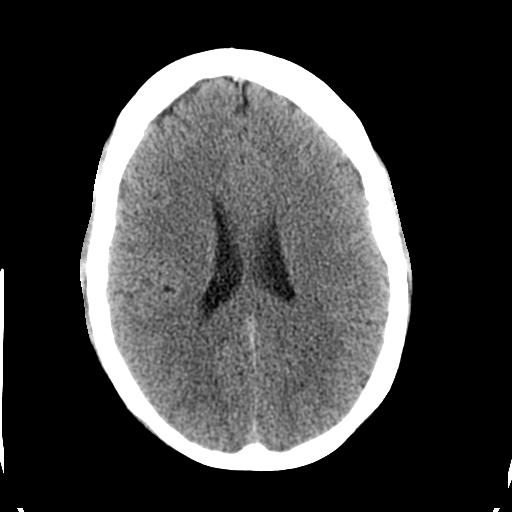
[im 16/30  bone]
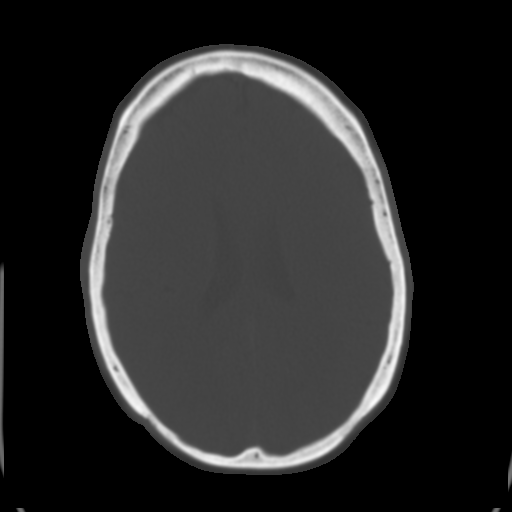
[im 18/30  brain]
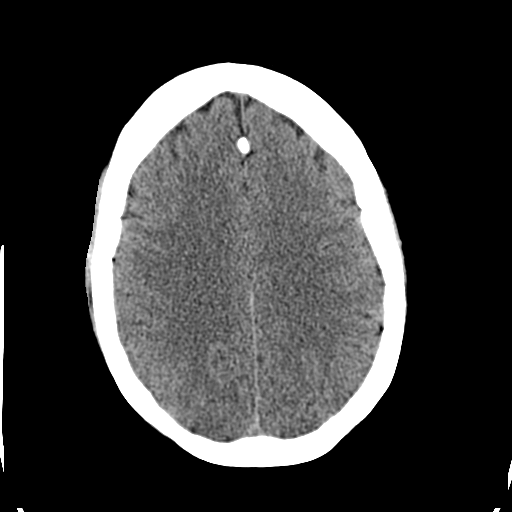
[im 20/30  brain]
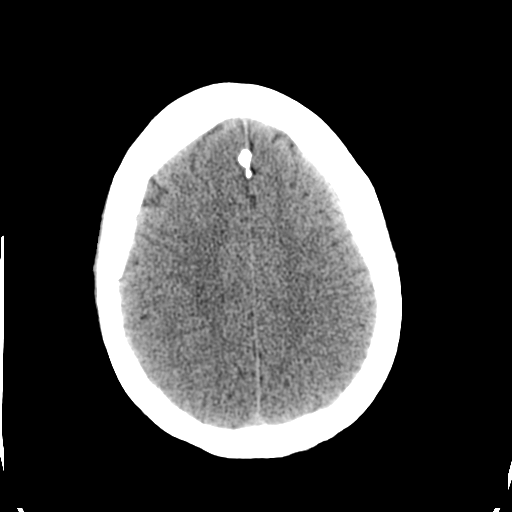
[im 22/30  brain]
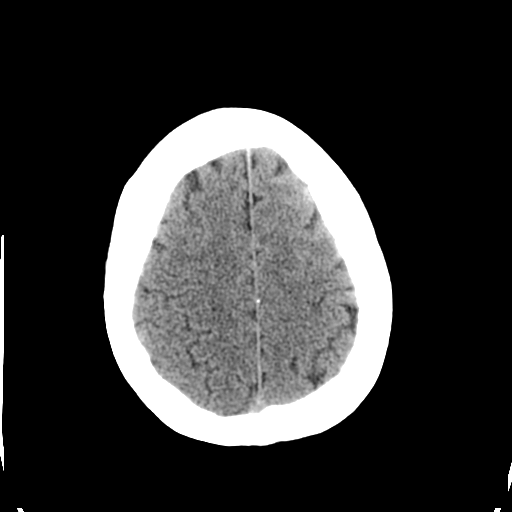
[im 23/30  brain]
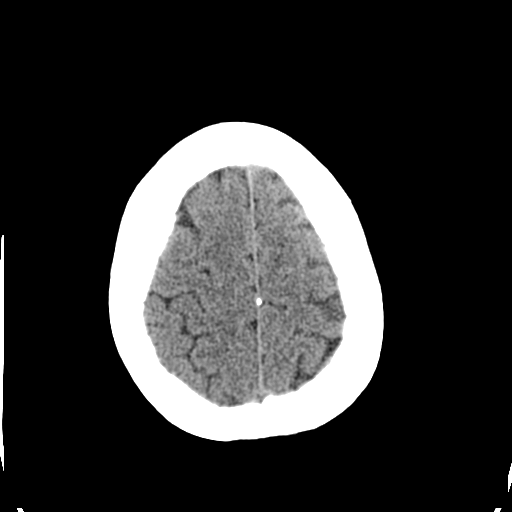
[im 23/30  bone]
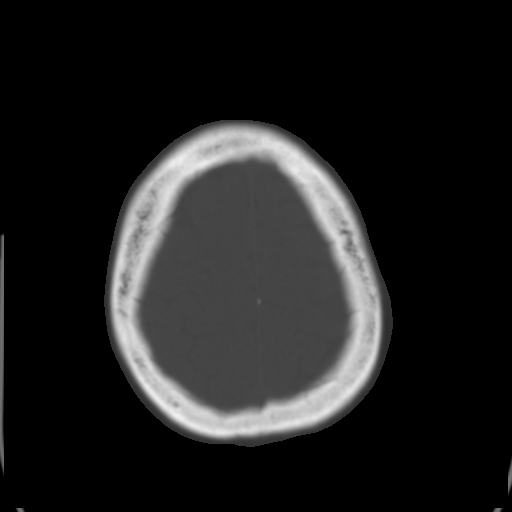
[im 25/30  brain]
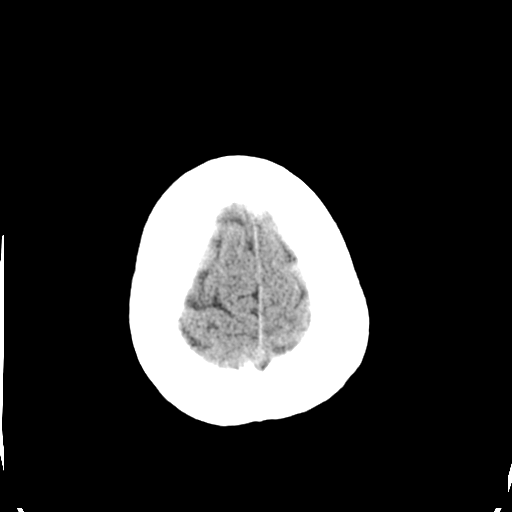
[im 27/30  brain]
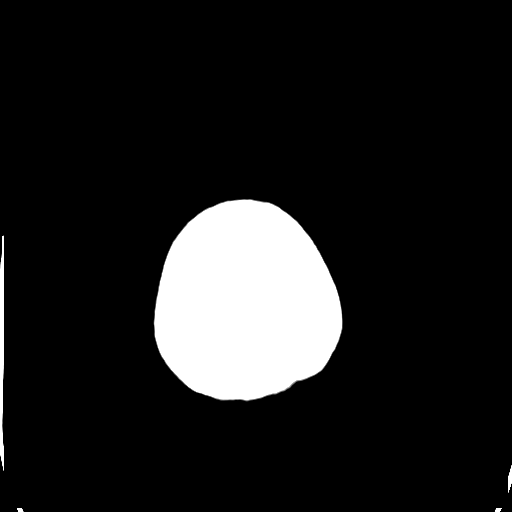
[im 29/30  brain]
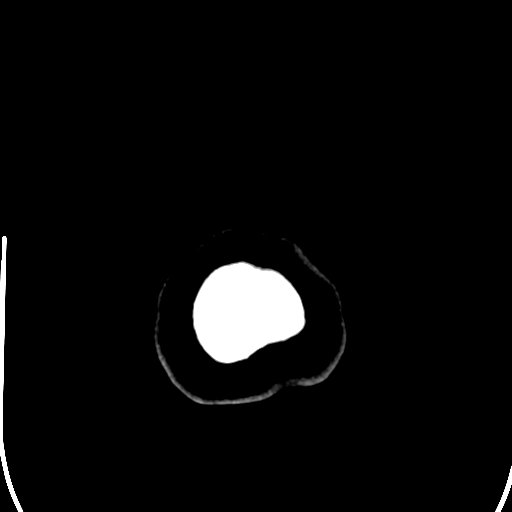

[16 of 30 positions shown; findings below may reference images not displayed]

FINDINGS: The brain has a normal appearance without evidence for
hemorrhage, acute infarction, hydrocephalus, or mass lesion.  There
is no extra axial fluid collection.  The skull and paranasal
sinuses are normal.
IMPRESSION: Normal CT of the head without contrast.

## 2011-07-22 LAB — PREGNANCY, URINE: Preg Test, Ur: NEGATIVE

## 2011-07-22 LAB — TYPE AND SCREEN
ABO/RH(D): B POS
Antibody Screen: NEGATIVE

## 2011-07-22 LAB — CBC
Hemoglobin: 10.3 — ABNORMAL LOW
Hemoglobin: 11.6 — ABNORMAL LOW
MCHC: 32
MCHC: 32.4
Platelets: 328
RBC: 4.05
RDW: 15.2
WBC: 17.6 — ABNORMAL HIGH

## 2011-07-22 LAB — BASIC METABOLIC PANEL
Calcium: 8.8
Creatinine, Ser: 0.89
GFR calc Af Amer: >60
GFR calc non Af Amer: >60
Sodium: 135

## 2011-07-22 LAB — ABO/RH: ABO/RH(D): B POS

## 2011-12-18 ENCOUNTER — Emergency Department (HOSPITAL_COMMUNITY)
Admission: EM | Admit: 2011-12-18 | Discharge: 2011-12-18 | Disposition: A | Discharge status: Home / Self Care | Payer: PRIVATE HEALTH INSURANCE | Attending: Emergency Medicine | Admitting: Emergency Medicine

## 2011-12-18 ENCOUNTER — Encounter (HOSPITAL_COMMUNITY): Payer: Self-pay | Admitting: *Deleted

## 2011-12-18 DIAGNOSIS — M79609 Pain in unspecified limb: Secondary | ICD-10-CM

## 2011-12-18 DIAGNOSIS — I803 Phlebitis and thrombophlebitis of lower extremities, unspecified: Secondary | ICD-10-CM | POA: Insufficient documentation

## 2011-12-18 DIAGNOSIS — R209 Unspecified disturbances of skin sensation: Secondary | ICD-10-CM | POA: Insufficient documentation

## 2011-12-18 DIAGNOSIS — I809 Phlebitis and thrombophlebitis of unspecified site: Secondary | ICD-10-CM

## 2011-12-18 DIAGNOSIS — I739 Peripheral vascular disease, unspecified: Secondary | ICD-10-CM | POA: Insufficient documentation

## 2011-12-18 DIAGNOSIS — G47419 Narcolepsy without cataplexy: Secondary | ICD-10-CM | POA: Insufficient documentation

## 2011-12-18 DIAGNOSIS — M25579 Pain in unspecified ankle and joints of unspecified foot: Secondary | ICD-10-CM | POA: Insufficient documentation

## 2011-12-18 MED ORDER — NAPROXEN 375 MG PO TABS: 375.0000 mg | ORAL_TABLET | Freq: Two times a day (BID) | ORAL | Status: AC

## 2011-12-18 MED ORDER — ACETAMINOPHEN-CODEINE #3 300-30 MG PO TABS: 1.0000 | ORAL_TABLET | Freq: Four times a day (QID) | ORAL | Status: AC | PRN

## 2011-12-18 NOTE — Discharge Instructions (Signed)
Take naprosyn for inflammation and pain and use tylenol #3 for breakthrough pain but do not drive or operate machinery with tylenol #3. Consider compression hose for additional pain relief. Follow up with your primary care provider for recheck of ongoing discomfort but return to ER for emergent changing or worsening of symptoms.   Phlebitis Phlebitis is a redness, tenderness and soreness (inflammation) in a vein. This can occur in your arms, legs, or torso (trunk), as well as deeper inside your body.  CAUSES  Phlebitis can be triggered by multiple factors. These include:  Reduced (restricted) blood flow through your veins. This happens with prolonged bed rest, long distance travel, injury or surgery. Being overweight (obese) and pregnant can also restrict blood flow and lead to phlebitis.   Putting a catheter in the vein (intravenous or IV) and giving certain medications through in the vein (intravenously).   Cancer and cancer treatment.   Use of illegal intravenous drugs.   Inflammatory diseases.   Inherited (genetic) diseases that increase the risk for blood clots.   Hormone therapy (such as birth control pills).  SYMPTOMS   Red, tender, swollen, painful area on your skin.   Usually, the area will be long and narrow.   Low grade fever.   Significant firmness along the center of this area. This can indicate that a blood clot has formed.   Surrounding redness or a high fever, which can indicate an infection (cellulitis).  DIAGNOSIS   The appearance of your condition and your symptoms will cause your caregiver to suspect phlebitis. Usually, this is enough for a diagnosis.   Your caregiver may request blood tests or an ultrasound test of the area to be sure you do not have an infection or a blood clot. Blood tests and discussing your family history may also indicate if you have an underlying genetic disease that causes blood clots.   Occasionally, a piece of tissue is taken from  the body (biopsy) if an unusual cause of phlebitis is suspected.  TREATMENT   Raise (elevate) the affected area above the level of the heart.   Apply a warm compress or heating pad for 20 minutes, 3 or 4 times a day. If you use an electric heating pad, follow the directions so you do not burn yourself.   Anti-inflammatory medications are usually recommended. Follow your caregiver's directions.   Any IV catheter, if present, will be removed by your caregiver.   Your caregiver may prescribe medicines that kill germs (antibiotics) if an infection is present.   Your caregiver may recommend blood thinners if a blood clot is suspected or present.   Support stockings or bandages may be helpful, depending on the cause and location of the phlebitis.   Surgery may be needed to remove very damaged sections of vein, but this is rare.  HOME CARE INSTRUCTIONS   Take medications exactly as prescribed.   Follow up with your caregiver as directed.   Use support stockings or bandages if advised. These will speed healing and prevent recurrence.   If you are on blood thinners:   Do follow-up blood tests exactly as directed.   Check with your caregiver before using any new medications.   Wear a pendant to show that you are on blood thinners.   For phlebitis in the legs:   Avoid prolonged standing or bed rest.   Keep your legs moving. Raise your legs with sitting or lying.   Do not smoke.   Women, particularly those  over the age of 75, should consider the risks and benefits of taking the contraceptive pill. This kind of hormone treatment can increase your risk for blood clots.  SEEK MEDICAL CARE IF:   You have unusual bruising or any bleeding problems.   Swelling or pain in your affected arm or leg is not gradually improving.   You are on anti-inflammatory medication and you develop belly (abdominal) pain.  SEEK IMMEDIATE MEDICAL CARE IF:   An unexplained oral temperature above 100.5 F  (38.1 C) develops.   You have sudden onset of chest pain or difficulty breathing.  Document Released: 10/04/2001 Document Revised: 06/22/2011 Document Reviewed: 07/06/2009 [facility] Patient Information 2012 [place], [place].

## 2011-12-18 NOTE — ED Provider Notes (Signed)
History     CSN: [id number]  Arrival date & time 12/18/11  1057   First MD Initiated Contact with Patient 12/18/11 1147      Chief Complaint  Patient presents with  . Claudication    RT lower medial calf    (Consider location/radiation/quality/duration/timing/severity/associated sxs/prior treatment) HPI Comments: Michele Garcia presents with complaint of right ankle numbness and tenderness which started this morning. She woke up with a numb sensation in her ankle, this sensation went away, but is now tender to palpation. She also complains of a "knot" in her right leg which she first noticed about 1 week ago. The knot has not bothered her until this morning. She has not tried anything to relieve the ankle tenderness. She decided to come to the ED today because her mother warned her that the knot could be a blood clot. Denies risk factors for pulmonary embolism including recent immobilization, history of blood clots, history of coagulopathy, estrogen use, smoking.  Patient is a 46 y.o. female presenting with leg pain. The history is provided by the patient.  Leg Pain  The incident occurred more than 1 week ago. There was no injury mechanism. The pain is present in the left ankle and right ankle. The quality of the pain is described as throbbing. Pertinent negatives include no numbness, no inability to bear weight, no muscle weakness and no loss of sensation.    Past Medical History  Diagnosis Date  . Narcolepsy     Past Surgical History  Procedure Date  . Abdominal hysterectomy     No family history on file.  History  Substance Use Topics  . Smoking status: Not on file  . Smokeless tobacco: Not on file  . Alcohol Use:     OB History    Grav Para Term Preterm Abortions TAB SAB Ect Mult Living                  Review of Systems  Constitutional: Negative for fever, chills and fatigue.  HENT: Negative for sore throat and rhinorrhea.   Eyes: Negative for redness.    Respiratory: Negative for cough and shortness of breath.   Cardiovascular: Positive for leg swelling. Negative for chest pain.  Gastrointestinal: Negative for nausea, vomiting, abdominal pain and diarrhea.  Genitourinary: Negative for dysuria.  Musculoskeletal: Positive for myalgias. Negative for joint swelling and gait problem.  Skin: Negative for rash and wound.  Neurological: Negative for numbness and headaches.    Allergies  Review of patient's allergies indicates no known allergies.  Home Medications   Current Outpatient Rx  Name Route Sig Dispense Refill  . CALCIUM PO Oral Take 1 tablet by mouth daily.    . ADULT MULTIVITAMIN W/MINERALS CH Oral Take 1 tablet by mouth daily.      BP 136/77  Pulse 79  Temp(Src) 98.3 F (36.8 C) (Oral)  Resp 20  SpO2 97%  LMP 12/18/2011  Physical Exam  Nursing note and vitals reviewed. Constitutional: She is oriented to person, place, and time. She appears well-developed and well-nourished.  HENT:  Head: Normocephalic and atraumatic.  Eyes: Conjunctivae are normal. Right eye exhibits no discharge. Left eye exhibits no discharge.  Neck: Normal range of motion. Neck supple.  Cardiovascular: Normal rate, regular rhythm and normal heart sounds.   Pulses:      Dorsalis pedis pulses are 2+ on the right side, and 2+ on the left side.       Posterior tibial pulses are 2+ on  the right side, and 2+ on the left side.  Pulmonary/Chest: Effort normal and breath sounds normal.  Abdominal: Soft. There is no tenderness.  Musculoskeletal: Normal range of motion. She exhibits tenderness (mild tenderness to palpation on R medial ankle). She exhibits no edema.       Right ankle: She exhibits normal range of motion, no swelling, no ecchymosis, no deformity and normal pulse. tenderness.       Measurements: Right calf: 43 cm Left calf: 43 cm Right ankle (4cm proximal to lateral malleolus): 24 cm Left ankle (4cm proximal to lateral malleolus): 23 cm  2  x 3 cm tender, palpable nodule on medial aspect of right ankle.  Ankle is warm distal to the nodule. No pitting edema. No erythema.  Firm non-tender cord, L medial ankle.    Neurological: She is alert and oriented to person, place, and time.  Skin: Skin is warm and dry. No rash noted. No erythema.  Psychiatric: She has a normal mood and affect.    ED Course  Procedures (including critical care time)  Labs Reviewed - No data to display No results found.   1. Thrombophlebitis     1:25 PM Patient seen and examined. Work-up initiated.    Vital signs reviewed and are as follows: Filed Vitals:   12/18/11 1316  BP: 120/69  Pulse: 68  Temp: 97.8 F (36.6 C)  Resp:    Patient moved to the CDU pending bilateral LE venous dopplers to rule-out DVT. Suspect thrombophlebitis. Patient discussed with Hunt PA-C.  MDM  Lower extremity tenderness, palpable nodules, likely thrombophlebitis. Move to CDU pending LE dopplers to rule-out DVT.   Medical screening examination/treatment/procedure(s) were performed by non-physician practitioner and as supervising physician I was immediately available for consultation/collaboration. Osvaldo Human, M.D.  Eustace Moore [place], [place] 12/18/11 1646  Carleene Cooper III, MD 12/19/11 2100

## 2011-12-18 NOTE — Progress Notes (Signed)
VASCULAR LAB PRELIMINARY  PRELIMINARY  PRELIMINARY  PRELIMINARY  Bilateral lower extremity venous Dopplers completed.    Preliminary report:  No DVT noted in the bilateral lower extremities.  There is thrombophlebitis noted in the bilateral calf/ankle areas.   Sherren Kerns [place], 12/18/2011, 2:44 PM

## 2011-12-18 NOTE — ED Provider Notes (Signed)
Patient sent to cdu by Dr. Rodman Pickle PA-C for vascular doppler US to rule out DVT. If negative then treating for thrombophlebitis. No signs or symptoms of cellulitis. Extremities are neurovasc in tact with good pedal pulse and cap refill of LE. Afebrile, NAD.   [place], [place] 12/18/11 1357  Medical screening examination/treatment/procedure(s) were performed by non-physician practitioner and as supervising physician I was immediately available for consultation/collaboration. Clinical Dx superficial thrombophlebitis. Osvaldo Human, M.D.   Carleene Cooper III, MD 12/19/11 2101

## 2011-12-18 NOTE — ED Notes (Signed)
Pt up walking in room and appears to be in no acute distress.

## 2011-12-18 NOTE — ED Notes (Signed)
PT reports  A 2 wk. Hx of RLE pain with a knot- per PT . No HX of DVT

## 2012-11-02 ENCOUNTER — Encounter (HOSPITAL_COMMUNITY): Payer: Self-pay

## 2012-11-02 ENCOUNTER — Other Ambulatory Visit: Payer: Self-pay | Admitting: Obstetrics and Gynecology

## 2012-11-02 ENCOUNTER — Ambulatory Visit (HOSPITAL_COMMUNITY)
Admission: RE | Admit: 2012-11-02 | Discharge: 2012-11-02 | Disposition: A | Discharge status: Home / Self Care | Payer: PRIVATE HEALTH INSURANCE | Source: Ambulatory Visit | Attending: Obstetrics and Gynecology | Admitting: Obstetrics and Gynecology

## 2012-11-02 VITALS — BP 118/86 | Temp 98.2°F | Ht 65.75 in | Wt 263.0 lb

## 2012-11-02 DIAGNOSIS — N644 Mastodynia: Secondary | ICD-10-CM | POA: Insufficient documentation

## 2012-11-02 DIAGNOSIS — Z1239 Encounter for other screening for malignant neoplasm of breast: Secondary | ICD-10-CM

## 2012-11-02 HISTORY — DX: Hyperlipidemia, unspecified: E78.5

## 2012-11-02 HISTORY — DX: Essential (primary) hypertension: I10

## 2012-11-02 NOTE — Progress Notes (Addendum)
Complaints of right breast pain. Patient rates pain at a 6-7 out of 10. Patient stated pain comes and goes and is worse with activity.  Pap Smear:    Pap smear not performed today. Per patient last Pap smear was 3-4 years ago and normal. Per patient she has no history of abnormal Pap smears. Patient has a history of a hysterectomy for DUB and fibroids 06/05/2008. No further Pap smears are needed since patient has a history of a hysterectomy for benign reasons. No Pap smear results in EPIC.  Physical exam: Breasts Breasts symmetrical. No skin abnormalities bilateral breasts. No nipple retraction bilateral breasts. No nipple discharge bilateral breasts. No lymphadenopathy. No lumps palpated bilateral breasts. Patient complained of tenderness when palpated right outer and lower breast. Patient referred to the [facility] for Diagnostic Mammogram. Appointment scheduled for Friday, November 09, 2012 at 1325.       Pelvic/Bimanual No Pap smear completed today since patient has a history of a hysterectomy for benign reasons. Pap smear not indicated per BCCCP guidelines.

## 2012-11-02 NOTE — Patient Instructions (Signed)
Taught patient how to perform BSE and gave educational materials to take home. Patient did not need a Pap smear today due to history of a hysterectomy for benign reasons. Let patient know that she will not need any further Pap smears since she has a history of a hysterectomy for benign reasons and has no history of abnormal Pap smears. Patient referred to the [facility] for Diagnostic Mammogram. Appointment scheduled for Friday, November 09, 2012 at 1325.  Patient aware of appointment and will be there. Patient verbalized understanding.

## 2012-11-09 ENCOUNTER — Other Ambulatory Visit: Payer: Self-pay | Admitting: Obstetrics and Gynecology

## 2012-11-09 ENCOUNTER — Ambulatory Visit
Admission: RE | Admit: 2012-11-09 | Discharge: 2012-11-09 | Disposition: A | Discharge status: Home / Self Care | Payer: No Typology Code available for payment source | Source: Ambulatory Visit | Attending: Obstetrics and Gynecology | Admitting: Obstetrics and Gynecology

## 2012-11-09 DIAGNOSIS — N644 Mastodynia: Secondary | ICD-10-CM

## 2014-01-08 ENCOUNTER — Ambulatory Visit: Payer: Self-pay | Admitting: Family Medicine

## 2014-01-08 VITALS — BP 122/78 | HR 76 | Temp 98.0°F | Resp 18 | Ht 64.0 in | Wt 258.8 lb

## 2014-01-08 DIAGNOSIS — J019 Acute sinusitis, unspecified: Secondary | ICD-10-CM

## 2014-01-08 DIAGNOSIS — M545 Low back pain, unspecified: Secondary | ICD-10-CM

## 2014-01-08 DIAGNOSIS — J9801 Acute bronchospasm: Secondary | ICD-10-CM

## 2014-01-08 DIAGNOSIS — R809 Proteinuria, unspecified: Secondary | ICD-10-CM

## 2014-01-08 DIAGNOSIS — J069 Acute upper respiratory infection, unspecified: Secondary | ICD-10-CM

## 2014-01-08 LAB — POCT URINALYSIS DIPSTICK
Bilirubin, UA: NEGATIVE
Blood, UA: NEGATIVE
Glucose, UA: NEGATIVE
KETONES UA: NEGATIVE
LEUKOCYTES UA: NEGATIVE
NITRITE UA: NEGATIVE
Spec Grav, UA: >=1.03
UROBILINOGEN UA: 0.2
pH, UA: 6.5

## 2014-01-08 LAB — POCT CBC
GRANULOCYTE PERCENT: 30.8 % — AB (ref 37–80)
HEMATOCRIT: 35.5 % — AB (ref 37.7–47.9)
Hemoglobin: 11.2 g/dL — AB (ref 12.2–16.2)
Lymph, poc: 2.8 (ref 0.6–3.4)
MCH, POC: 27.6 pg (ref 27–31.2)
MCHC: 31.5 g/dL — AB (ref 31.8–35.4)
MCV: 87.5 fL (ref 80–97)
MID (CBC): 0.5 (ref 0–0.9)
MPV: 8.3 fL (ref 0–99.8)
PLATELET COUNT, POC: 254 10*3/uL (ref 142–424)
POC Granulocyte: 1.5 — AB (ref 2–6.9)
POC LYMPH PERCENT: 58.1 %L — AB (ref 10–50)
POC MID %: 11.1 %M (ref 0–12)
RBC: 4.06 M/uL (ref 4.04–5.48)
RDW, POC: 13.8 %
WBC: 4.9 10*3/uL (ref 4.6–10.2)

## 2014-01-08 LAB — GLUCOSE, POCT (MANUAL RESULT ENTRY): POC Glucose: 98 mg/dl (ref 70–99)

## 2014-01-08 MED ORDER — ALBUTEROL SULFATE (2.5 MG/3ML) 0.083% IN NEBU
2.5000 mg | INHALATION_SOLUTION | Freq: Once | RESPIRATORY_TRACT | Status: AC
  Administered 2014-01-08: 2.5 mg via RESPIRATORY_TRACT

## 2014-01-08 MED ORDER — AZITHROMYCIN 250 MG PO TABS: ORAL_TABLET | ORAL | Status: DC

## 2014-01-08 MED ORDER — PREDNISONE 20 MG PO TABS: ORAL_TABLET | ORAL | Status: DC

## 2014-01-08 NOTE — Progress Notes (Addendum)
Subjective:   This chart was scribed for [name], MD by Arlan Organ, [facility] Scribe. This patient was seen in room 8 and the patient's care was started 5:36 PM.    Patient ID: Michele Garcia, female    DOB: 29-Jun-1966, 48 y.o.   MRN: [id number]  01/08/2014  Nasal Congestion, Cough, Fever and Back Pain   HPI  HPI Comments: Michele Garcia is a 48 y.o. Female with a PMHx of HTN, Narcolepsy, and Hyperlipidemia who presents to [facility] complaining of a productive cough consisting of yellow sputum x 1 week. She also reports associated nasal congestion consisting of clear mucous, cough, 1 episode of emesis today, fever, wheezing, chills, mild light-headedness, rhinorrhea, and back pain. She reports having similar symptoms about 1 month ago, but states all symptoms resolved. She has now noted the same set of symptoms about 1 week ago. She has tried Nyquil and Mucinex with mild temporary improvement. At this time she denies any ear pain, sore throat, HA, or fever. She denies currently being a smoker. Denies a history of Asthma.  She admits to intermittent non-radiating back pain onset 1 week. This pain is described as "burning". She associates this pain with job related activities as she does a lot of lifting at work. Pt states lateral rotation of the torso exacerbates her discomfort. Denies any numbness, dysuria, hematuria, bladder tenderness, or tingling in her lower extremities. Denies any bowel or urinary changes.  States she currently has a history of narcolepsy and falls asleep about 20 times a day. However, she states her current job requires her to stand, which keeps her from falling asleep as often.  No other concerns this visit.  Review of Systems  Constitutional: Positive for chills. Negative for fever.  HENT: Positive for congestion, postnasal drip, rhinorrhea and sinus pressure. Negative for ear pain and sore throat.     Respiratory: Positive for cough. Negative for shortness of breath and wheezing.   Gastrointestinal: Positive for vomiting. Negative for nausea, abdominal pain and diarrhea.  Genitourinary: Negative for dysuria, urgency, frequency, hematuria and flank pain.  Musculoskeletal: Positive for back pain.  Skin: Negative for rash.  Neurological: Positive for light-headedness. Negative for weakness and numbness.  Psychiatric/Behavioral: Negative for confusion.    Past Medical History  Diagnosis Date  . Narcolepsy   . Hypertension   . Hyperlipidemia    No Known Allergies Current Outpatient Prescriptions  Medication Sig Dispense Refill  . azithromycin (ZITHROMAX) 250 MG tablet Two tablets daily x 1 day then one tablet daily x 4 days  6 each  0  . predniSONE (DELTASONE) 20 MG tablet Two tablets daily x 5 days then one tablet daily x 5 days  15 tablet  0   Current Facility-Administered Medications  Medication Dose Route Frequency Provider Last Rate Last Dose  . albuterol (PROVENTIL) (2.5 MG/3ML) 0.083% nebulizer solution 2.5 mg  2.5 mg Nebulization Once [name], MD           Objective:    BP 122/78  Pulse 76  Temp(Src) 98 F (36.7 C) (Oral)  Resp 18  Ht 5\' 4"  (1.626 m)  Wt 258 lb 12.8 oz (117.391 kg)  BMI 44.40 kg/m2  SpO2 98%  LMP 12/18/2011  Physical Exam  Constitutional: She is oriented to person, place, and time. She appears well-developed and well-nourished.  HENT:  Head: Normocephalic and atraumatic.  Maxillary sinus tenderness  Eyes: EOM are normal.  Neck: Normal range of motion.  Cardiovascular: Normal rate, regular rhythm and normal heart sounds.  Exam reveals no gallop and no friction rub.   No murmur heard. Pulmonary/Chest: Effort normal. No respiratory distress. She has wheezes. She has no rales.  Scant wheezing intermittently throughout  Musculoskeletal: She exhibits tenderness.       Lumbar back: She exhibits tenderness, pain and spasm. She exhibits  normal range of motion and no bony tenderness.  Full ROM of lumbar spine; straight leg raises negative.  Toe and heel walking intact.  Tenderness to palpation over paraspinal muscles  Neurological: She is alert and oriented to person, place, and time.  Skin: Skin is warm and dry.  Psychiatric: She has a normal mood and affect. Her behavior is normal.  Nursing note and vitals reviewed.  Results for orders placed in visit on 01/08/14  POCT CBC      Result Value Ref Range   WBC 4.9  4.6 - 10.2 K/uL   Lymph, poc 2.8  0.6 - 3.4   POC LYMPH PERCENT 58.1 (*) 10 - 50 %L   MID (cbc) 0.5  0 - 0.9   POC MID % 11.1  0 - 12 %M   POC Granulocyte 1.5 (*) 2 - 6.9   Granulocyte percent 30.8 (*) 37 - 80 %G   RBC 4.06  4.04 - 5.48 M/uL   Hemoglobin 11.2 (*) 12.2 - 16.2 g/dL   HCT, POC 16.1 (*) 09.6 - 47.9 %   MCV 87.5  80 - 97 fL   MCH, POC 27.6  27 - 31.2 pg   MCHC 31.5 (*) 31.8 - 35.4 g/dL   RDW, POC 04.5     Platelet Count, POC 254  142 - 424 K/uL   MPV 8.3  0 - 99.8 fL  GLUCOSE, POCT (MANUAL RESULT ENTRY)      Result Value Ref Range   POC Glucose 98  70 - 99 mg/dl  POCT URINALYSIS DIPSTICK      Result Value Ref Range   Color, UA yellow     Clarity, UA clear     Glucose, UA neg     Bilirubin, UA neg     Ketones, UA neg     Spec Grav, UA >=1.030     Blood, UA neg     pH, UA 6.5     Protein, UA 3+     Urobilinogen, UA 0.2     Nitrite, UA neg     Leukocytes, UA Negative         Assessment & Plan:  Acute upper respiratory infections of unspecified site - Plan: POCT CBC, POCT glucose (manual entry), albuterol (PROVENTIL) (2.5 MG/3ML) 0.083% nebulizer solution 2.5 mg  Bronchospasm - Plan: POCT CBC, POCT glucose (manual entry), albuterol (PROVENTIL) (2.5 MG/3ML) 0.083% nebulizer solution 2.5 mg  Low back pain - Plan: POCT CBC, POCT urinalysis dipstick  Acute sinusitis  Proteinuria   1. Acute sinusitis:  New.  Rx for Zithromax provided; recommend Mucinex DM bid and Afrin PRN. 2.   URI: New.  Etiology to sinusitis. 3. Bronchospasm:  New. S/p Albuterol nebulizer in office; rx for Prednisone provided. 4. Proteinuria:  New. Recommend RTC in upcoming 2-4 weeks after good hydration for repeat u/a; will also warrant Creatinine.  Asymptomatic. 5.  Low back pain/strain: New.  Rx for Prednisone provided; recommend rest, heat to area bid for 15-20 minutes; frequent ambulation.  Recommend avoiding heavy lifting or repetitive bending, twisting, rotating.  If no  improvement in two weeks, call for ortho or PT referral.   Meds ordered this encounter  Medications  . albuterol (PROVENTIL) (2.5 MG/3ML) 0.083% nebulizer solution 2.5 mg    Sig:   . azithromycin (ZITHROMAX) 250 MG tablet    Sig: Two tablets daily x 1 day then one tablet daily x 4 days    Dispense:  6 each    Refill:  0  . predniSONE (DELTASONE) 20 MG tablet    Sig: Two tablets daily x 5 days then one tablet daily x 5 days    Dispense:  15 tablet    Refill:  0    Return in about 1 month (around 02/08/2014) for repeat urine to follow-up on protein in urine.  I personally performed the services described in this documentation, which was scribed in my presence.  The recorded information has been reviewed and is accurate.   Nilda SimmerKristi Billiejo Sorto, M.D.  [facility]  [place] [address] [place], [place]  [id number] [phone] phone [phone] fax

## 2014-01-08 NOTE — Patient Instructions (Signed)
1.  Drink 4-6 bottles of water per day.  Sinusitis Sinusitis is redness, soreness, and swelling (inflammation) of the paranasal sinuses. Paranasal sinuses are air pockets within the bones of your face (beneath the eyes, the middle of the forehead, or above the eyes). In healthy paranasal sinuses, mucus is able to drain out, and air is able to circulate through them by way of your nose. However, when your paranasal sinuses are inflamed, mucus and air can become trapped. This can allow bacteria and other germs to grow and cause infection. Sinusitis can develop quickly and last only a short time (acute) or continue over a long period (chronic). Sinusitis that lasts for more than 12 weeks is considered chronic.  CAUSES  Causes of sinusitis include:  Allergies.  Structural abnormalities, such as displacement of the cartilage that separates your nostrils (deviated septum), which can decrease the air flow through your nose and sinuses and affect sinus drainage.  Functional abnormalities, such as when the small hairs (cilia) that line your sinuses and help remove mucus do not work properly or are not present. SYMPTOMS  Symptoms of acute and chronic sinusitis are the same. The primary symptoms are pain and pressure around the affected sinuses. Other symptoms include:  Upper toothache.  Earache.  Headache.  Bad breath.  Decreased sense of smell and taste.  A cough, which worsens when you are lying flat.  Fatigue.  Fever.  Thick drainage from your nose, which often is green and may contain pus (purulent).  Swelling and warmth over the affected sinuses. DIAGNOSIS  Your caregiver will perform a physical exam. During the exam, your caregiver may:  Look in your nose for signs of abnormal growths in your nostrils (nasal polyps).  Tap over the affected sinus to check for signs of infection.  View the inside of your sinuses (endoscopy) with a special imaging device with a light attached  (endoscope), which is inserted into your sinuses. If your caregiver suspects that you have chronic sinusitis, one or more of the following tests may be recommended:  Allergy tests.  Nasal culture A sample of mucus is taken from your nose and sent to a lab and screened for bacteria.  Nasal cytology A sample of mucus is taken from your nose and examined by your caregiver to determine if your sinusitis is related to an allergy. TREATMENT  Most cases of acute sinusitis are related to a viral infection and will resolve on their own within 10 days. Sometimes medicines are prescribed to help relieve symptoms (pain medicine, decongestants, nasal steroid sprays, or saline sprays).  However, for sinusitis related to a bacterial infection, your caregiver will prescribe antibiotic medicines. These are medicines that will help kill the bacteria causing the infection.  Rarely, sinusitis is caused by a fungal infection. In theses cases, your caregiver will prescribe antifungal medicine. For some cases of chronic sinusitis, surgery is needed. Generally, these are cases in which sinusitis recurs more than 3 times per year, despite other treatments. HOME CARE INSTRUCTIONS   Drink plenty of water. Water helps thin the mucus so your sinuses can drain more easily.  Use a humidifier.  Inhale steam 3 to 4 times a day (for example, sit in the bathroom with the shower running).  Apply a warm, moist washcloth to your face 3 to 4 times a day, or as directed by your caregiver.  Use saline nasal sprays to help moisten and clean your sinuses.  Take over-the-counter or prescription medicines for pain, discomfort, or  fever only as directed by your caregiver. SEEK IMMEDIATE MEDICAL CARE IF:  You have increasing pain or severe headaches.  You have nausea, vomiting, or drowsiness.  You have swelling around your face.  You have vision problems.  You have a stiff neck.  You have difficulty breathing. MAKE SURE  YOU:   Understand these instructions.  Will watch your condition.  Will get help right away if you are not doing well or get worse. Document Released: 10/10/2005 Document Revised: 01/02/2012 Document Reviewed: 10/25/2011 [facility] Patient Information 2014 [place], [place].

## 2014-01-10 ENCOUNTER — Encounter: Payer: Self-pay | Admitting: *Deleted

## 2014-05-29 ENCOUNTER — Ambulatory Visit (INDEPENDENT_AMBULATORY_CARE_PROVIDER_SITE_OTHER): Payer: Self-pay | Admitting: Family Medicine

## 2014-05-29 ENCOUNTER — Ambulatory Visit (INDEPENDENT_AMBULATORY_CARE_PROVIDER_SITE_OTHER): Payer: Self-pay

## 2014-05-29 VITALS — BP 126/84 | HR 86 | Temp 98.2°F | Resp 18 | Ht 65.0 in | Wt 255.0 lb

## 2014-05-29 DIAGNOSIS — M940 Chondrocostal junction syndrome [Tietze]: Secondary | ICD-10-CM

## 2014-05-29 DIAGNOSIS — R809 Proteinuria, unspecified: Secondary | ICD-10-CM

## 2014-05-29 DIAGNOSIS — R079 Chest pain, unspecified: Secondary | ICD-10-CM

## 2014-05-29 LAB — POCT URINALYSIS DIPSTICK
BILIRUBIN UA: NEGATIVE
Blood, UA: NEGATIVE
Glucose, UA: NEGATIVE
Ketones, UA: NEGATIVE
LEUKOCYTES UA: NEGATIVE
NITRITE UA: NEGATIVE
PH UA: 6
PROTEIN UA: 100
Spec Grav, UA: >=1.03
UROBILINOGEN UA: 0.2

## 2014-05-29 IMAGING — CR DG RIBS W/ CHEST 3+V*R*
3 series · 3 of 3 positions shown · non-contrast
Comparison: [DATE]

CLINICAL DATA: Chest pain

EXAM:
RIGHT RIBS AND CHEST - 3+ VIEW

[PA]
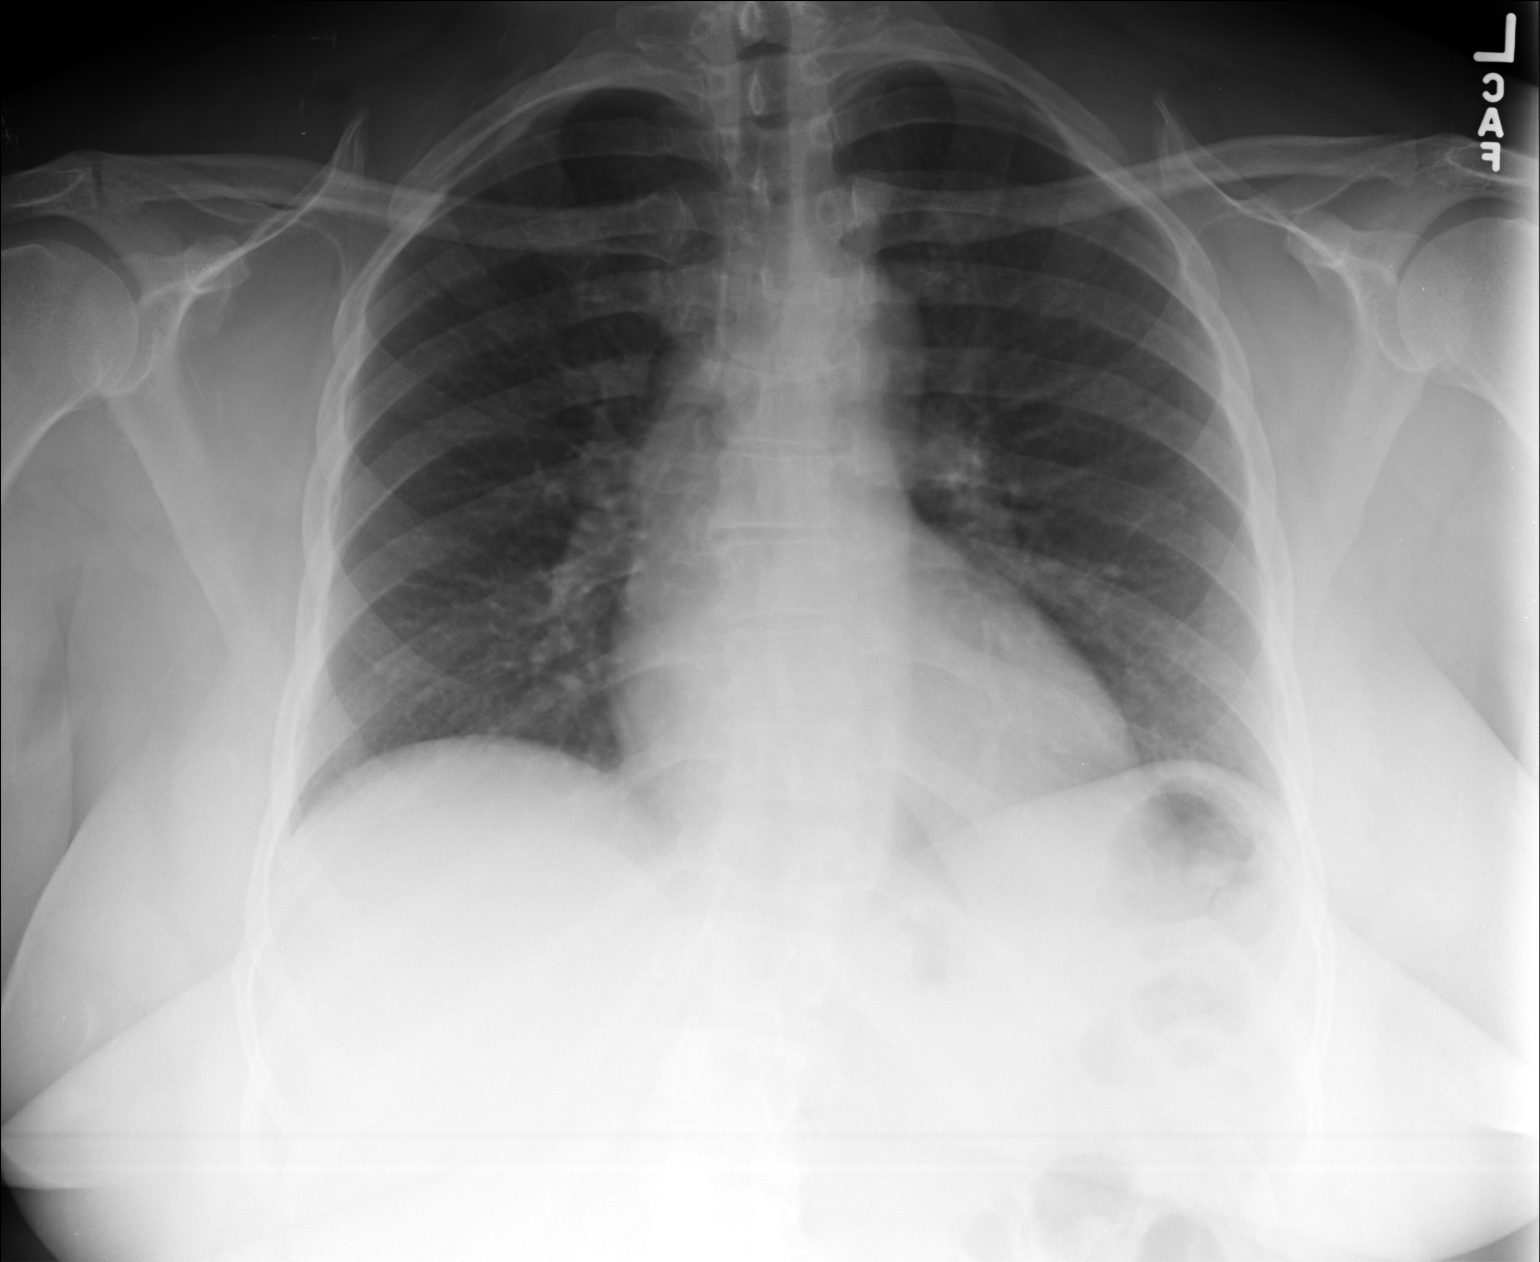

[AP]
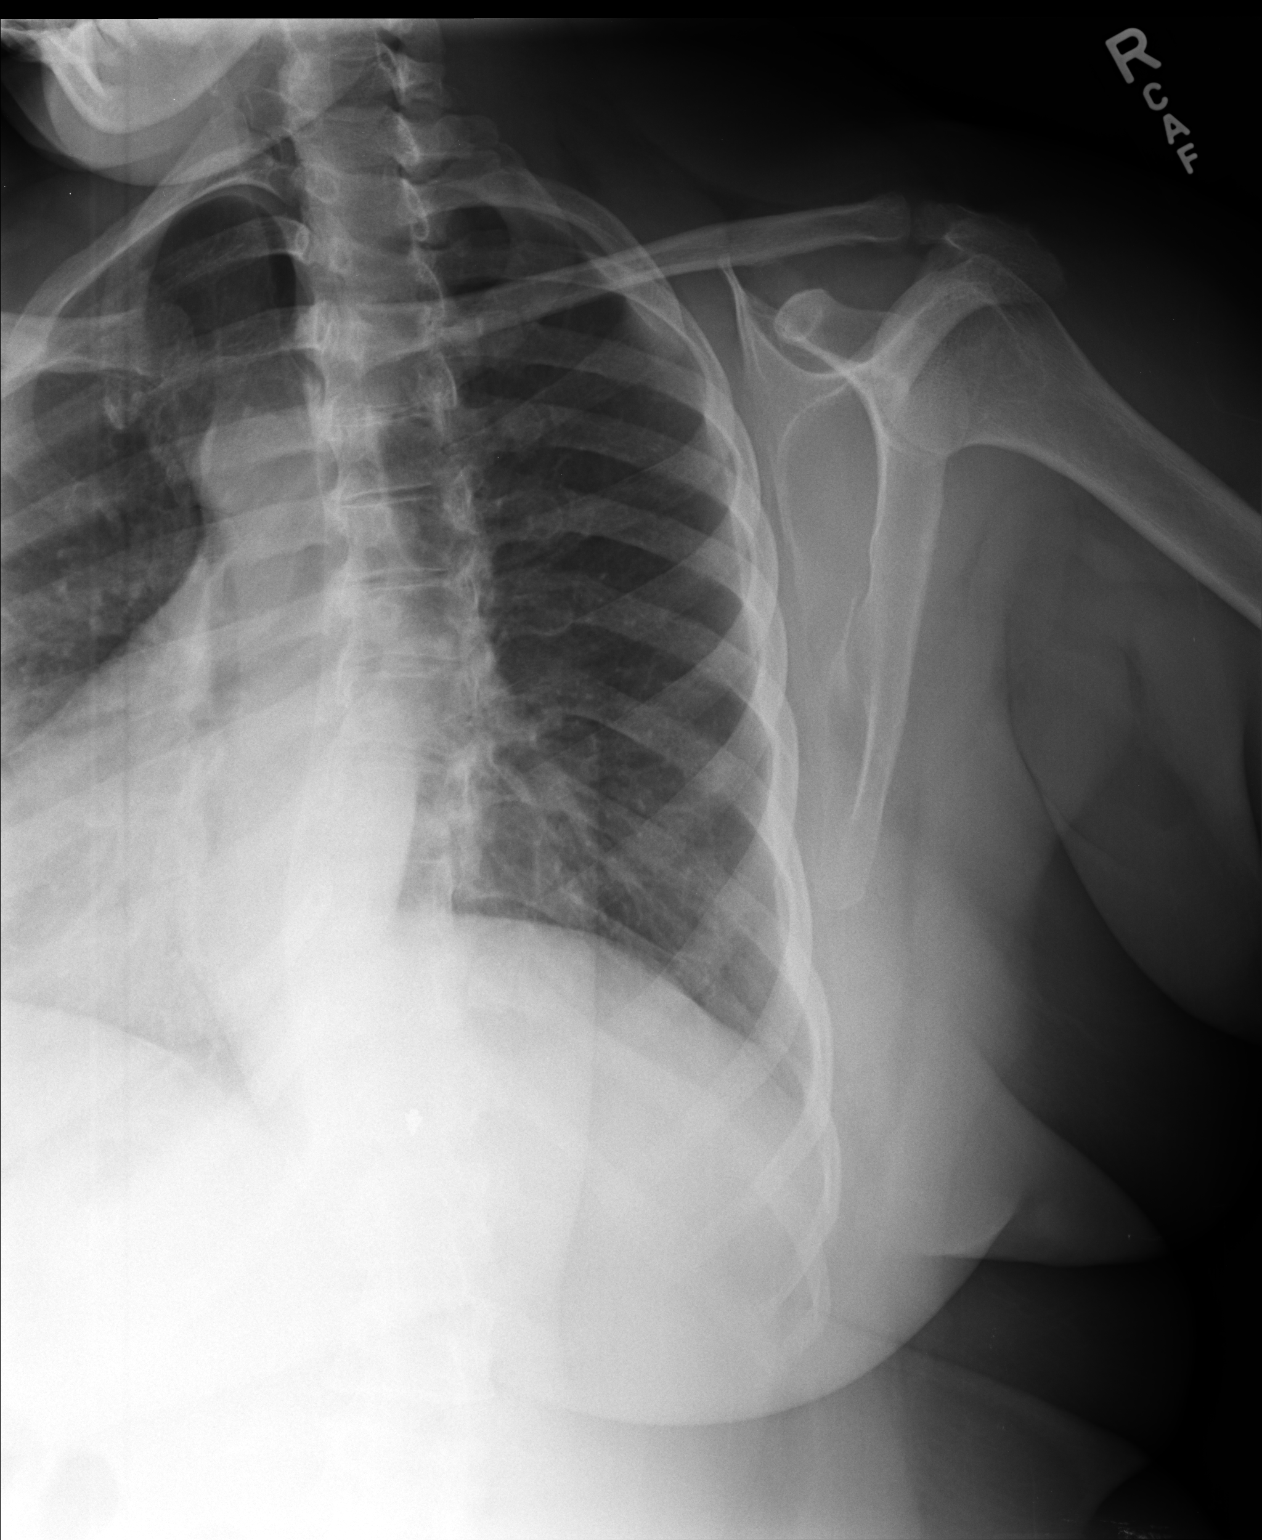

[lao]
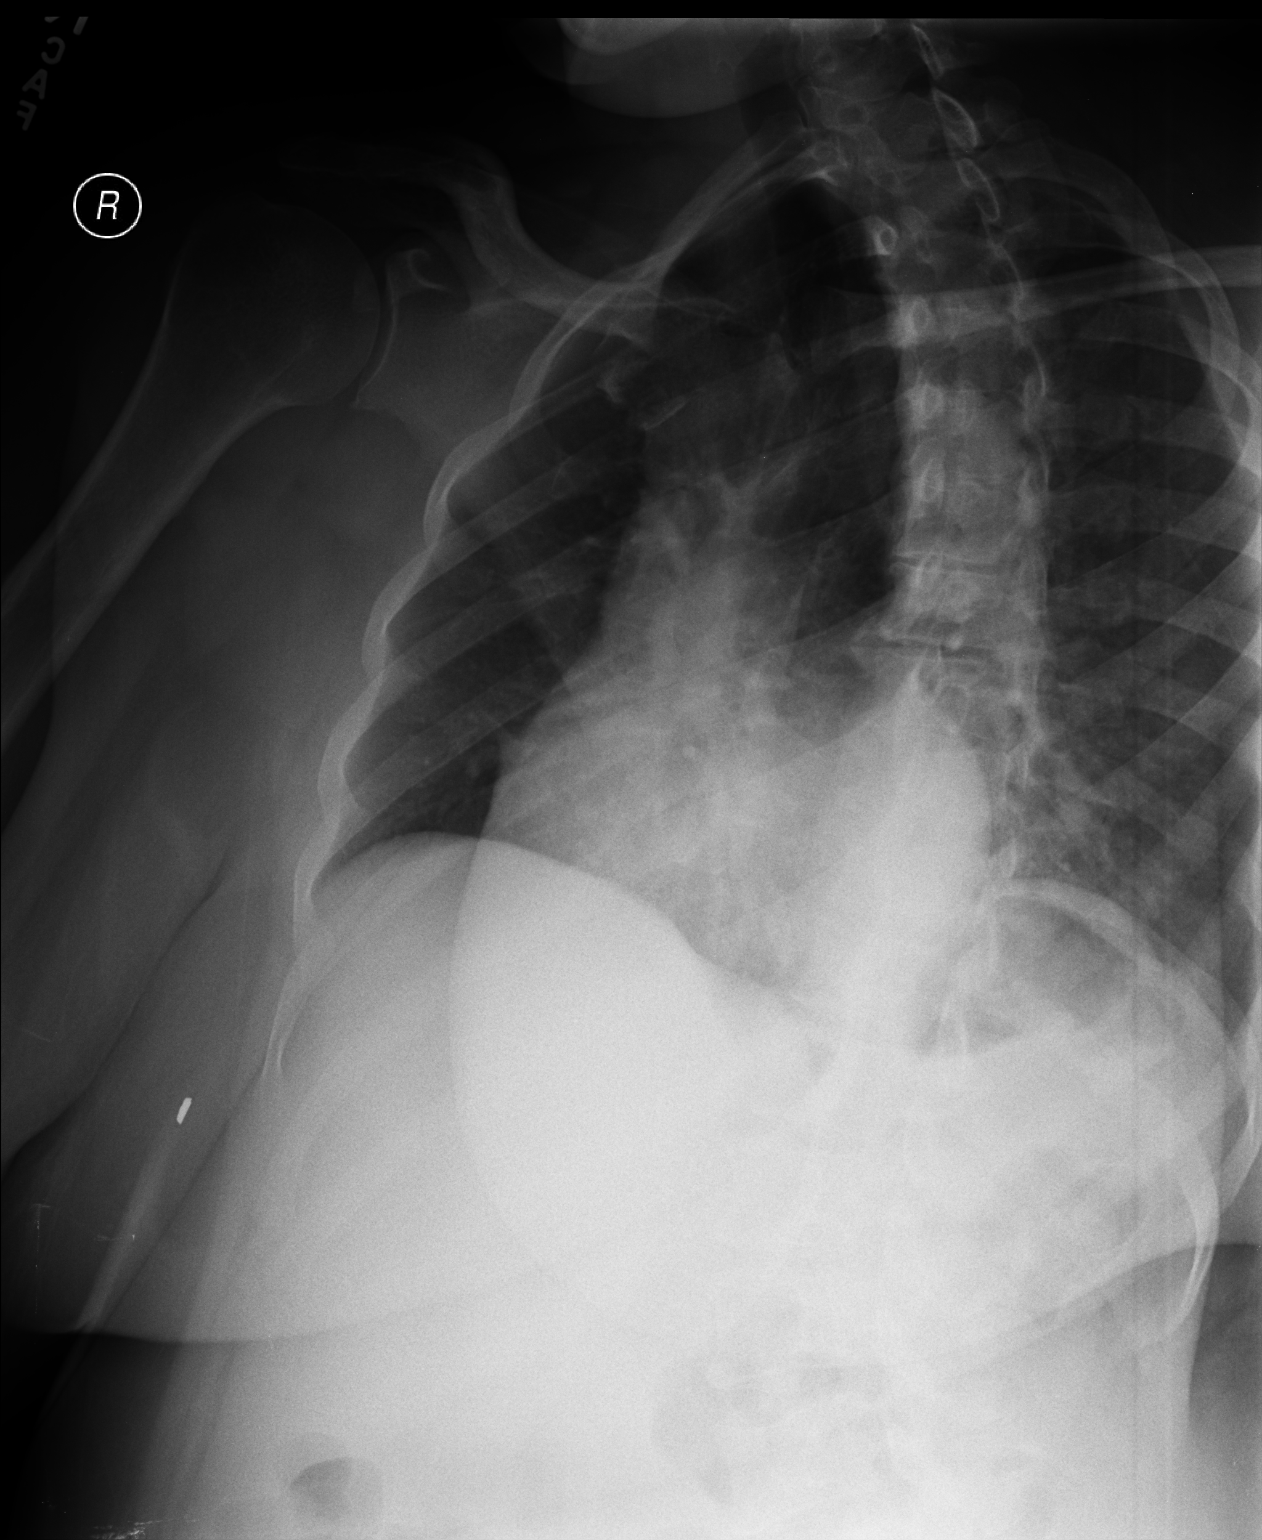

[3 of 3 positions shown; findings below may reference images not displayed]

FINDINGS: No fracture or other bone lesions are seen involving the ribs. There
is no evidence of pneumothorax or pleural effusion. Both lungs are
clear. Heart size and mediastinal contours are within normal limits.
IMPRESSION: Negative.

## 2014-05-29 MED ORDER — MELOXICAM 15 MG PO TABS: 15.0000 mg | ORAL_TABLET | Freq: Every day | ORAL | Status: DC

## 2014-05-29 MED ORDER — CYCLOBENZAPRINE HCL 5 MG PO TABS: 5.0000 mg | ORAL_TABLET | Freq: Every day | ORAL | Status: DC

## 2014-05-29 NOTE — Patient Instructions (Signed)
Costochondritis Costochondritis, sometimes called Tietze syndrome, is a swelling and irritation (inflammation) of the tissue (cartilage) that connects your ribs with your breastbone (sternum). It causes pain in the chest and rib area. Costochondritis usually goes away on its own over time. It can take up to 6 weeks or longer to get better, especially if you are unable to limit your activities. CAUSES  Some cases of costochondritis have no known cause. Possible causes include:  Injury (trauma).  Exercise or activity such as lifting.  Severe coughing. SIGNS AND SYMPTOMS  Pain and tenderness in the chest and rib area.  Pain that gets worse when coughing or taking deep breaths.  Pain that gets worse with specific movements. DIAGNOSIS  Your health care provider will do a physical exam and ask about your symptoms. Chest X-rays or other tests may be done to rule out other problems. TREATMENT  Costochondritis usually goes away on its own over time. Your health care provider may prescribe medicine to help relieve pain. HOME CARE INSTRUCTIONS   Avoid exhausting physical activity. Try not to strain your ribs during normal activity. This would include any activities using chest, abdominal, and side muscles, especially if heavy weights are used.  Apply ice to the affected area for the first 2 days after the pain begins.  Put ice in a plastic bag.  Place a towel between your skin and the bag.  Leave the ice on for 20 minutes, 2-3 times a day.  Only take over-the-counter or prescription medicines as directed by your health care provider. SEEK MEDICAL CARE IF:  You have redness or swelling at the rib joints. These are signs of infection.  Your pain does not go away despite rest or medicine. SEEK IMMEDIATE MEDICAL CARE IF:   Your pain increases or you are very uncomfortable.  You have shortness of breath or difficulty breathing.  You cough up blood.  You have worse chest pains,  sweating, or vomiting.  You have a fever or persistent symptoms for more than 2-3 days.  You have a fever and your symptoms suddenly get worse. MAKE SURE YOU:   Understand these instructions.  Will watch your condition.  Will get help right away if you are not doing well or get worse. Document Released: 07/20/2005 Document Revised: 07/31/2013 Document Reviewed: 05/14/2013 [employer] Patient Information 2015 [employer]. This information is not intended to replace advice given to you by your health care provider. Make sure you discuss any questions you have with your health care provider.  

## 2014-05-29 NOTE — Progress Notes (Addendum)
Subjective:   This chart was scribed for Michele Chick, MD by Arlan Organ, [facility] Scribe. This patient was seen in room Room/bed info not found7 and the patient's care was started 7:52 PM.    Patient ID: Michele Garcia, female    DOB: 09-03-1966, 48 y.o.   MRN: [id number]  05/29/2014  Chest Pain   HPI  HPI Comments: Michele Garcia is a 48 y.o. female who [facility] to [facility] complaining of intermittent, moderate chest pain that radiates to the back that has been ongoing for some time; duration intermittently for two years. Reports episodes approximately twice a month. Pt states symptoms persisted last night and have been constant since onset yesterday evening. CP is exacerbated with movement and deep breathing. No alleviating factors at this time. She also reports SOB and bilateral leg swelling (R greater than L) at baseline she associated with her weight and standing for long periods of time at work. She admits to recent increase in belching. She denies any wheezing, fever, chills, abdominal pain, nausea or vomiting. States she was seen in ED recently and was told symptoms may be associated to inflammation in chest wall. She reports a history of a blood clot located in a superficial verin 2 years ago. She was treated with antiinflammatory and denies being started on a blood thinner. However, pt has researched natural blood thinners and has started drinking 100% grape juice every day. Pt states she is constantly standing on her feet approximately 10 hours daily while at work.   PSHx consisting of a C-section and a hysterectomy.    Mother is 21 with a history of breast cancer diagnosed in her 31s. Father, sister and brother are living without any medical problems. No other concerns this visit.  Of note, at last visit, pt was noted to have a moderate amount of protein in urine; pt has been increasing water intake daily.  Review of  Systems  Constitutional: Negative for fever and chills.  Respiratory: Positive for shortness of breath.   Cardiovascular: Positive for chest pain and leg swelling.  Gastrointestinal: Negative for nausea, vomiting, abdominal pain and diarrhea.  Skin: Negative for rash.    Past Medical History  Diagnosis Date  . Narcolepsy   . Hypertension   . Hyperlipidemia    Past Surgical History  Procedure Laterality Date  . Abdominal hysterectomy    . Dermoid tumor    . Cesarean section      x 2   No Known Allergies Current Outpatient Prescriptions  Medication Sig Dispense Refill  . cyclobenzaprine (FLEXERIL) 5 MG tablet Take 1 tablet (5 mg total) by mouth at bedtime.  30 tablet  1  . meloxicam (MOBIC) 15 MG tablet Take 1 tablet (15 mg total) by mouth daily.  30 tablet  1   No current facility-administered medications for this visit.   History   Social History  . Marital Status: Married    Spouse Name: N/A    Number of Children: N/A  . Years of Education: N/A   Occupational History  . Not on file.   Social History Main Topics  . Smoking status: Never Smoker   . Smokeless tobacco: Not on file  . Alcohol Use: No  . Drug Use: No  . Sexual Activity: Yes    Birth Control/ Protection: Surgical   Other Topics Concern  . Not on file   Social History Narrative  . No narrative on  file   Family History  Problem Relation Age of Onset  . Cancer Mother [id number]    breast  . Cancer Maternal Grandmother     breast  . Diabetes Maternal Grandmother   . Diabetes Maternal Grandfather   . Heart disease Maternal Grandfather   . Hypertension Maternal Grandfather        Objective:    BP 126/84  Pulse 86  Temp(Src) 98.2 F (36.8 C) (Oral)  Resp 18  Ht 5\' 5"  (1.651 m)  Wt 255 lb (115.667 kg)  BMI 42.43 kg/m2  SpO2 97%  LMP 12/18/2011 Physical Exam  Nursing note and vitals reviewed. Constitutional: She is oriented to person, place, and time. She appears well-developed and  well-nourished.  HENT:  Head: Normocephalic.  Eyes: EOM are normal.  Neck: Normal range of motion.  Cardiovascular:  Pulses:      Dorsalis pedis pulses are 2+ on the right side, and 2+ on the left side.  Pulmonary/Chest: Effort normal.  Tenderness to palpation over R costochondral junction Distal sternum tenderness to palpation  Tenderness to palpation under R breast along distal ribs  Abdominal: She exhibits no distension.  Musculoskeletal: Normal range of motion. She exhibits edema and tenderness.  R leg 1 plus edema L leg with trace edema FROM of lumbar spine with pain reproduced  Neurological: She is alert and oriented to person, place, and time.  Psychiatric: She has a normal mood and affect.   Results for orders placed in visit on 05/29/14  POCT URINALYSIS DIPSTICK      Result Value Ref Range   Color, UA yellow     Clarity, UA cloudy     Glucose, UA neg     Bilirubin, UA neg     Ketones, UA neg     Spec Grav, UA >=1.030     Blood, UA neg     pH, UA 6.0     Protein, UA 100     Urobilinogen, UA 0.2     Nitrite, UA neg     Leukocytes, UA Negative     [facility] reading (PRIMARY) by  Dr. Katrinka BlazingSmith.  R RIBS: NAD.  EKG: NSR; no acute ST changes.      Assessment & Plan:   1. Chest pain, unspecified chest pain type   2. Protein in urine   3. Costochondritis    1 .Chest pain:  New.  Non-cardiac in origin; consistent with costochondritis.  Onset two years ago and now intermittent. 2.  Costochondritis:  New. Rx for Meloxicam and Flexeril provided; recommend rest, stretching.  Recommend avoiding heavy lifting for the next two weeks.   3. Proteinuria; improved from last visit but persistent; recommend continued monitoring.  Urine still very concentrated; recommend increased water intake.  Meds ordered this encounter  Medications  . meloxicam (MOBIC) 15 MG tablet    Sig: Take 1 tablet (15 mg total) by mouth daily.    Dispense:  30 tablet    Refill:  1  . cyclobenzaprine  (FLEXERIL) 5 MG tablet    Sig: Take 1 tablet (5 mg total) by mouth at bedtime.    Dispense:  30 tablet    Refill:  1    No Follow-up on file.    I personally performed the services described in this documentation, which was scribed in my presence. The recorded information has been reviewed and is accurate.   Nilda SimmerKristi Smith, M.D.  [facility]  [place] [address] [place], [place]  [address] [phone] phone [phone] fax

## 2015-06-04 ENCOUNTER — Ambulatory Visit (INDEPENDENT_AMBULATORY_CARE_PROVIDER_SITE_OTHER): Payer: Self-pay | Admitting: Family Medicine

## 2015-06-04 ENCOUNTER — Ambulatory Visit (INDEPENDENT_AMBULATORY_CARE_PROVIDER_SITE_OTHER): Payer: Self-pay

## 2015-06-04 VITALS — BP 144/86 | HR 81 | Temp 98.2°F | Resp 15 | Ht 65.0 in | Wt 266.0 lb

## 2015-06-04 DIAGNOSIS — I10 Essential (primary) hypertension: Secondary | ICD-10-CM

## 2015-06-04 DIAGNOSIS — R0789 Other chest pain: Secondary | ICD-10-CM | POA: Insufficient documentation

## 2015-06-04 LAB — COMPREHENSIVE METABOLIC PANEL
ALBUMIN: 4.3 g/dL (ref 3.6–5.1)
ALK PHOS: 116 U/L — AB (ref 33–115)
ALT: 15 U/L (ref 6–29)
AST: 16 U/L (ref 10–35)
BILIRUBIN TOTAL: 0.6 mg/dL (ref 0.2–1.2)
BUN: 9 mg/dL (ref 7–25)
CALCIUM: 9.2 mg/dL (ref 8.6–10.2)
CO2: 30 mmol/L (ref 20–31)
Chloride: 102 mmol/L (ref 98–110)
Creat: 0.9 mg/dL (ref 0.50–1.10)
GLUCOSE: 90 mg/dL (ref 65–99)
Potassium: 4.1 mmol/L (ref 3.5–5.3)
Sodium: 140 mmol/L (ref 135–146)
TOTAL PROTEIN: 7.7 g/dL (ref 6.1–8.1)

## 2015-06-04 LAB — POCT CBC
GRANULOCYTE PERCENT: 64.1 % (ref 37–80)
HEMATOCRIT: 41 % (ref 37.7–47.9)
Hemoglobin: 13.3 g/dL (ref 12.2–16.2)
LYMPH, POC: 3.2 (ref 0.6–3.4)
MCH: 27 pg (ref 27–31.2)
MCHC: 32.4 g/dL (ref 31.8–35.4)
MCV: 83.4 fL (ref 80–97)
MID (CBC): 0.3 (ref 0–0.9)
MPV: 6.9 fL (ref 0–99.8)
PLATELET COUNT, POC: 286 10*3/uL (ref 142–424)
POC GRANULOCYTE: 6.2 (ref 2–6.9)
POC LYMPH %: 32.9 % (ref 10–50)
POC MID %: 3 %M (ref 0–12)
RBC: 4.91 M/uL (ref 4.04–5.48)
RDW, POC: 13.3 %
WBC: 9.7 10*3/uL (ref 4.6–10.2)

## 2015-06-04 LAB — POCT GLYCOSYLATED HEMOGLOBIN (HGB A1C): HEMOGLOBIN A1C: 6.2

## 2015-06-04 IMAGING — CR DG CHEST 2V
2 series · 2 of 2 positions shown · non-contrast
Comparison: Chest x-ray of [DATE]

CLINICAL DATA: Chest and neck pain, history of hypertension,
hyperlipidemia, and morbid obesity.

EXAM:
CHEST  2 VIEW

[PA]
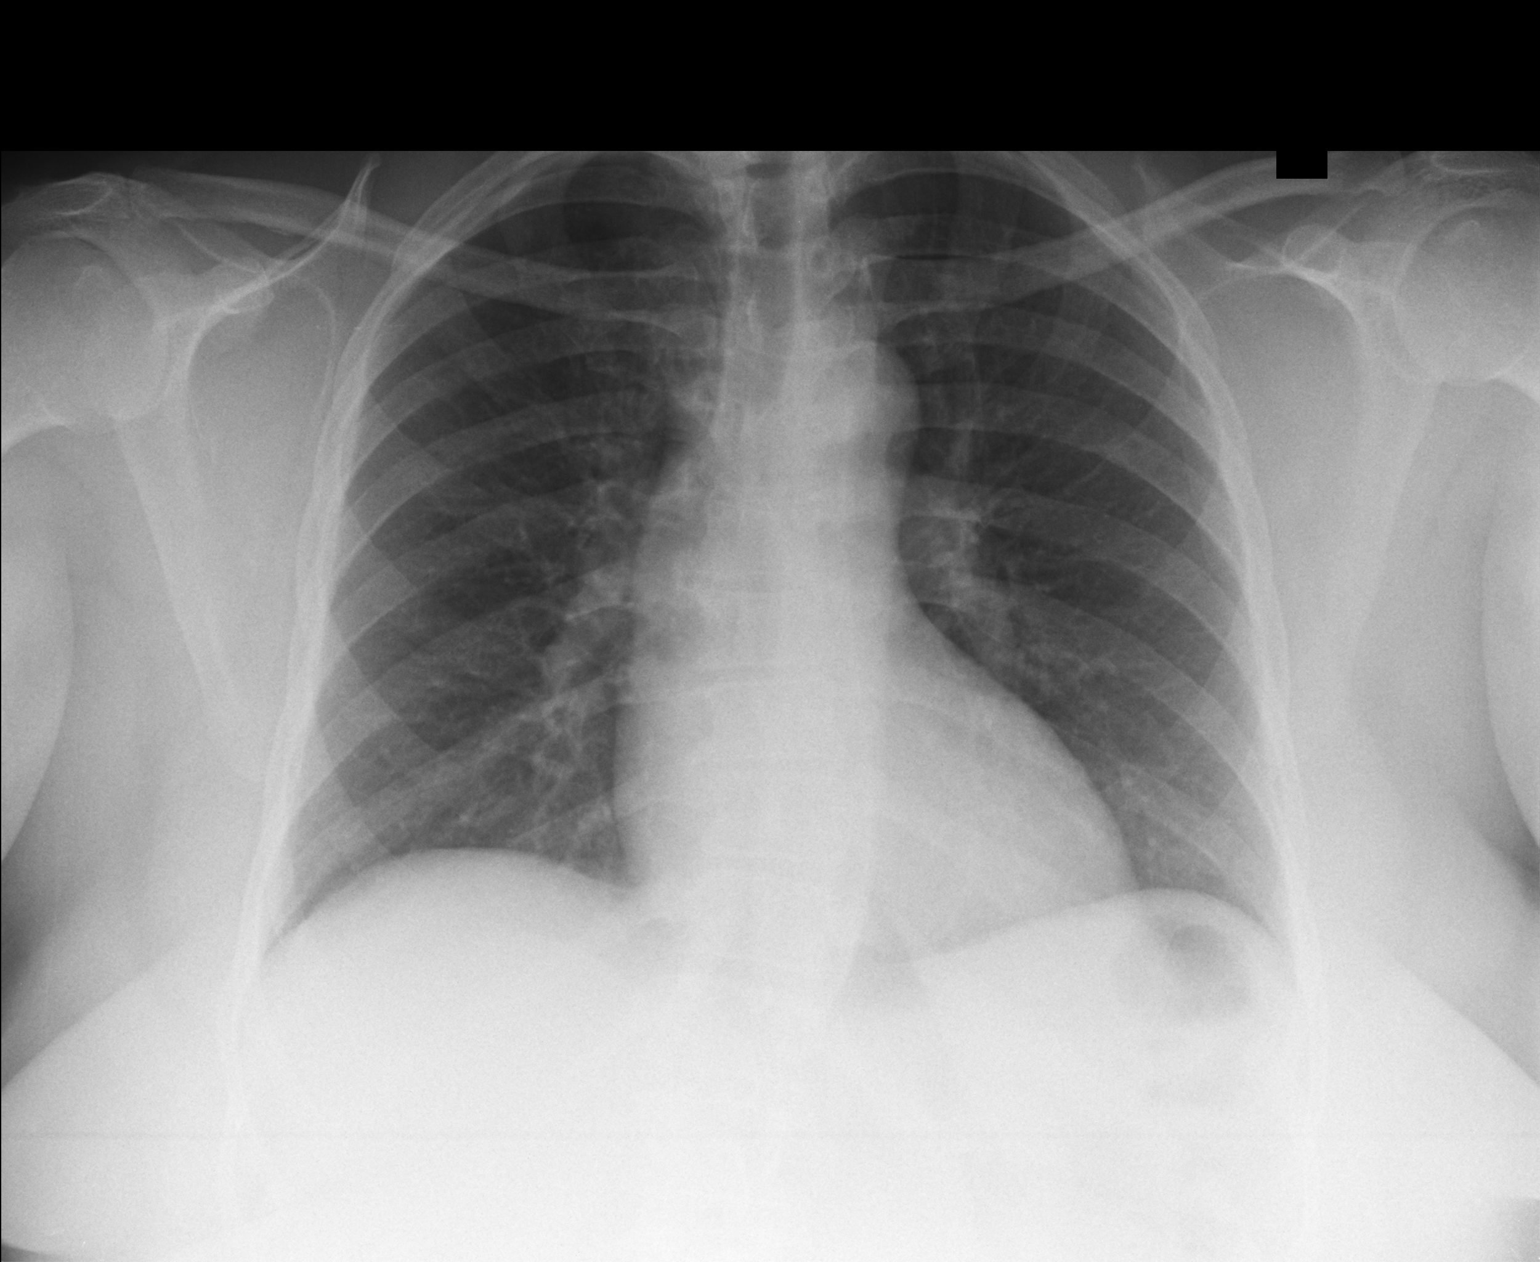

[lateral]
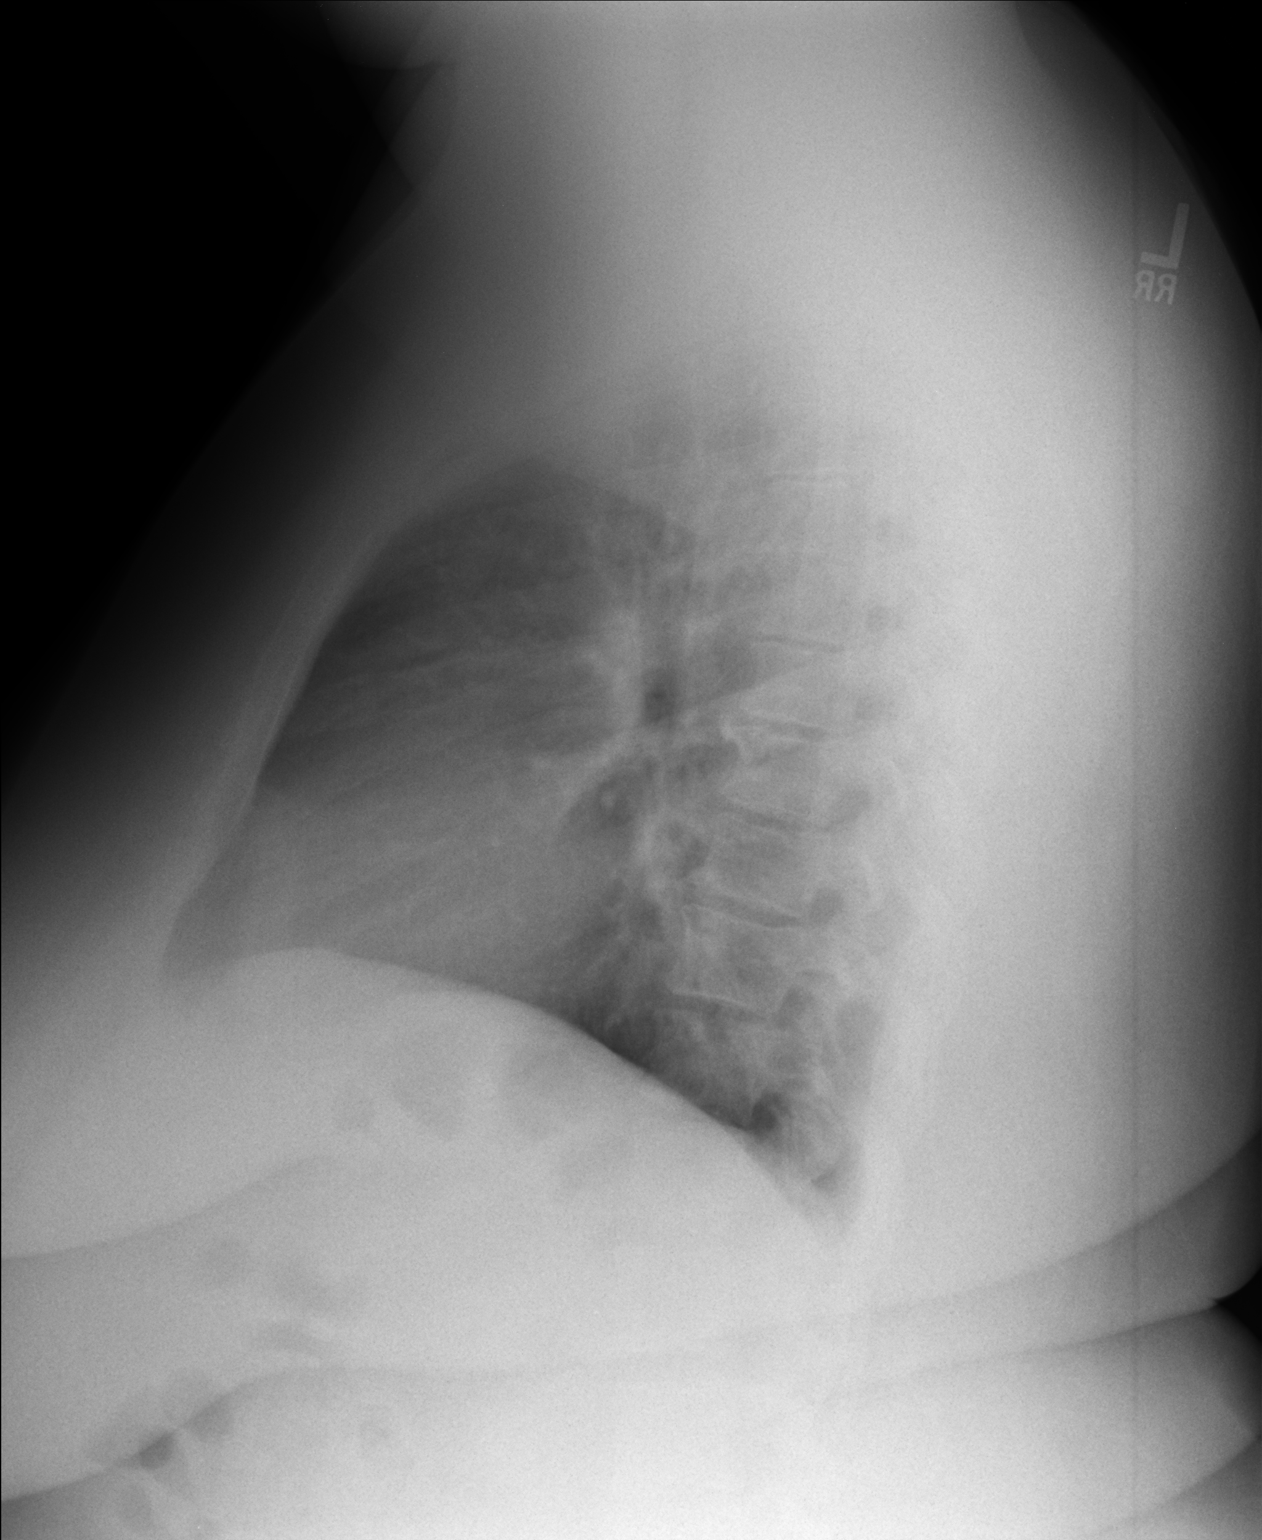

[2 of 2 positions shown; findings below may reference images not displayed]

FINDINGS: The lungs are adequately inflated and clear. The heart is normal in
size. The pulmonary vascularity is normal. There is no pleural
effusion or pneumothorax. The bony thorax is unremarkable.
IMPRESSION: There is no active cardiopulmonary disease.

## 2015-06-04 MED ORDER — MELOXICAM 7.5 MG PO TABS: 7.5000 mg | ORAL_TABLET | Freq: Every day | ORAL | Status: DC

## 2015-06-04 MED ORDER — HYDROCHLOROTHIAZIDE 12.5 MG PO TABS: 12.5000 mg | ORAL_TABLET | Freq: Every day | ORAL | Status: DC

## 2015-06-04 NOTE — Progress Notes (Signed)
MRN: [id number] DOB: 1966-02-01  Subjective:   Michele Garcia is a 49 y.o. female presenting for chief complaint of Chest Pain and Neck Pain  Reports 1 week history of 3 episodes of chest pain. Chest pain is sharp in nature, located over mid sternum to left side of chest, worse with deep breathing. Patient also has associated shortness of breath with these episodes, neck pain. Denies fever, heart racing, diaphoresis, jaw pain, n/v, abdominal pain, pnd. Of note, patient has had slight cough, nasal congestion in the past 3 days. Also patient has a history of narcolepsy, this was worked up 20+ years ago, has not been on medication for this. Reports daytime sleepiness, snoring. She denies history of diabetes, heart disease. Denies smoking cigarettes, alcohol use. Denies any other aggravating or relieving factors, no other questions or concerns.  Zarina currently has no medications in their medication list. Also has No Known Allergies.  Michele Garcia  has a past medical history of Narcolepsy; Hypertension; and Hyperlipidemia. Also  has past surgical history that includes Abdominal hysterectomy; dermoid tumor; and Cesarean section.  Objective:   Vitals: BP 144/86 mmHg  Pulse 81  Temp(Src) 98.2 F (36.8 C) (Oral)  Resp 15  Ht 5\' 5"  (1.651 m)  Wt 266 lb (120.657 kg)  BMI 44.26 kg/m2  SpO2 99%  LMP 12/18/2011  BP Readings from Last 3 Encounters:  06/04/15 144/86  05/29/14 126/84  01/08/14 122/78   Physical Exam  Constitutional: She is oriented to person, place, and time. She appears well-developed and well-nourished.  HENT:  TM's intact bilaterally, no effusions or erythema. Nares patent, nasal turbinates pink and moist, nasal passages patent. No sinus tenderness. Oropharynx clear, mucous membranes moist, dentition in good repair.  Eyes: Conjunctivae are normal. Pupils are equal, round, and reactive to light. No scleral icterus.  Cardiovascular: Normal rate, regular rhythm and  intact distal pulses.  Exam reveals no gallop and no friction rub.   No murmur heard. Pulmonary/Chest: No respiratory distress. She has no wheezes. She has no rales. She exhibits tenderness (over left side).  Abdominal: Soft. Bowel sounds are normal. She exhibits no distension and no mass. There is no tenderness.  Neurological: She is alert and oriented to person, place, and time.  Skin: Skin is warm and dry. No rash noted. She is not diaphoretic. No erythema. No pallor.  Psychiatric: She has a normal mood and affect.   Results for orders placed or performed in visit on 06/04/15 (from the past 24 hour(s))  POCT CBC     Status: None   Collection Time: 06/04/15  1:49 PM  Result Value Ref Range   WBC 9.7 4.6 - 10.2 K/uL   Lymph, poc 3.2 0.6 - 3.4   POC LYMPH PERCENT 32.9 10 - 50 %L   MID (cbc) 0.3 0 - 0.9   POC MID % 3.0 0 - 12 %M   POC Granulocyte 6.2 2 - 6.9   Granulocyte percent 64.1 37 - 80 %G   RBC 4.91 4.04 - 5.48 M/uL   Hemoglobin 13.3 12.2 - 16.2 g/dL   HCT, POC 40.9 81.1 - 47.9 %   MCV 83.4 80 - 97 fL   MCH, POC 27.0 27 - 31.2 pg   MCHC 32.4 31.8 - 35.4 g/dL   RDW, POC 91.4 %   Platelet Count, POC 286 142 - 424 K/uL   MPV 6.9 0 - 99.8 fL  POCT glycosylated hemoglobin (Hb A1C)     Status: None  Collection Time: 06/04/15  1:49 PM  Result Value Ref Range   Hemoglobin A1C 6.2    [facility] reading (PRIMARY) by  Dr. Clelia Croft and PA-Rad Gramling. CXR - normal.  ECG interpretation - has inverted T-waves in V3 which is new by comparison to previous ECG from 2015.  Assessment and Plan :   1. Atypical chest pain - Unclear etiology, will manage for costochondritis with meloxicam, recheck in 1 week - Referral to cardiology pending - EKG 12-Lead - POCT CBC - DG Chest 2 View; Future - Comprehensive metabolic panel - POCT glycosylated hemoglobin (Hb A1C) - meloxicam (MOBIC) 7.5 MG tablet; Take 1-2 tablets (7.5-15 mg total) by mouth daily.  Dispense: 30 tablet; Refill: 1  2. Morbid obesity -  recommended weight loss, dietary modifications, refused to rx a weight loss pill - POCT glycosylated hemoglobin (Hb A1C)  3. Essential hypertension - Will start HCT daily, recheck in ~3 months - Ambulatory referral to Cardiology - hydrochlorothiazide (HYDRODIURIL) 12.5 MG tablet; Take 1 tablet (12.5 mg total) by mouth daily.  Dispense: 90 tablet; Refill: 3   Wallis Bamberg, PA-C [facility] [phone] 06/04/2015 12:18 PM

## 2015-06-04 NOTE — Patient Instructions (Signed)

## 2015-06-05 ENCOUNTER — Ambulatory Visit (INDEPENDENT_AMBULATORY_CARE_PROVIDER_SITE_OTHER): Payer: Self-pay | Admitting: Cardiology

## 2015-06-05 ENCOUNTER — Encounter: Payer: Self-pay | Admitting: Cardiology

## 2015-06-05 VITALS — BP 142/100 | HR 108 | Ht 65.0 in | Wt 265.6 lb

## 2015-06-05 DIAGNOSIS — R0789 Other chest pain: Secondary | ICD-10-CM

## 2015-06-05 NOTE — Progress Notes (Signed)
Cardiology Office Note   Date:  06/06/2015   ID:  Michele Garcia, DOB December 22, 1965, MRN [id number]  PCP:  No PCP Per Patient  Cardiologist:   Rollene Rotunda, MD   Chief Complaint  Patient presents with  . Chest Pain      History of Present Illness: Michele Garcia is a 49 y.o. female who presents for evaluation of chest pain. She has no past cardiac history. She reports she last week after pulling a box at work had a sharp chest discomfort. She went about her regular business and had one other episode of a sharp pain a couple of days later with deep breathing. She had a couple of episodes at rest of her heart beating fast when she woke up at night. She does not report any other resting shortness of breath, PND or orthopnea. She's not had any other palpitations, presyncope or syncope. She doesn't exercise but she is active at work and cannot bring on cardiovascular symptoms. She's never had any other prior cardiac diagnosis or testing.  Past Medical History  Diagnosis Date  . Narcolepsy   . Hypertension   . Hyperlipidemia     Past Surgical History  Procedure Laterality Date  . Abdominal hysterectomy    . Dermoid tumor    . Cesarean section      x 2     Current Outpatient Prescriptions  Medication Sig Dispense Refill  . aspirin EC 81 MG tablet Take 81 mg by mouth daily.    . hydrochlorothiazide (HYDRODIURIL) 12.5 MG tablet Take 1 tablet (12.5 mg total) by mouth daily. 90 tablet 3  . meloxicam (MOBIC) 7.5 MG tablet Take 1-2 tablets (7.5-15 mg total) by mouth daily. 30 tablet 1   No current facility-administered medications for this visit.    Allergies:   Review of patient's allergies indicates no known allergies.    Social History:  The patient  reports that she has never smoked. She does not have any smokeless tobacco history on file. She reports that she does not drink alcohol or use illicit drugs.   Family History:  The patient's family history includes  Cancer in her maternal grandmother; Cancer (age of onset: 59) in her mother; Diabetes in her maternal grandfather and maternal grandmother; Hypertension in her maternal grandfather; Stroke in her maternal grandfather.    ROS:  Please see the history of present illness.   Otherwise, review of systems are positive for none.   All other systems are reviewed and negative.    PHYSICAL EXAM: VS:  BP 142/100 mmHg  Pulse 108  Ht 5\' 5"  (1.651 m)  Wt 265 lb 9.6 oz (120.475 kg)  BMI 44.20 kg/m2  LMP 12/18/2011 , BMI Body mass index is 44.2 kg/(m^2). GENERAL:  Well appearing HEENT:  Pupils equal round and reactive, fundi not visualized, oral mucosa unremarkable NECK:  No jugular venous distention, waveform within normal limits, carotid upstroke brisk and symmetric, no bruits, no thyromegaly LYMPHATICS:  No cervical, inguinal adenopathy LUNGS:  Clear to auscultation bilaterally BACK:  No CVA tenderness CHEST:  Unremarkable HEART:  PMI not displaced or sustained,S1 and S2 within normal limits, no S3, no S4, no clicks, no rubs, no murmurs ABD:  Flat, positive bowel sounds normal in frequency in pitch, no bruits, no rebound, no guarding, no midline pulsatile mass, no hepatomegaly, no splenomegaly EXT:  2 plus pulses throughout, no edema, no cyanosis no clubbing SKIN:  No rashes no nodules NEURO:  Cranial nerves II  through XII grossly intact, motor grossly intact throughout [facility]:  Cognitively intact, oriented to person place and time    EKG:  EKG is note ordered today. The ekg ordered 06/04/15 demonstrates normal sinus rhythm, rate 72, axis within normal limits, intervals within normal limits, nonspecific ST-T wave changes.   Recent Labs: 06/04/2015: ALT 15; BUN 9; Creat 0.90; Hemoglobin 13.3; Potassium 4.1; Sodium 140    Lipid Panel No results found for: CHOL, TRIG, HDL, CHOLHDL, VLDL, LDLCALC, LDLDIRECT    Wt Readings from Last 3 Encounters:  06/05/15 265 lb 9.6 oz (120.475 kg)  06/04/15  266 lb (120.657 kg)  05/29/14 255 lb (115.667 kg)      Other studies Reviewed: Additional studies/ records that were reviewed today include: ED records. Review of the above records demonstrates:  Please see elsewhere in the note.     ASSESSMENT AND PLAN:  CHEST PAIN:  The patient has atypical chest pain. However, she does have some nonspecific EKG changes. The pretest probability of obstructive coronary disease is low. However, she does need screening.  The patient had an excellent exercise tolerance.  There was no chest pain.  There was an appropriate level of dyspnea.  There were no arrhythmias, a normal heart rate response and normal BP response.  There were no ischemic ST T wave changes and a normal heart rate recovery.  PALPITATIONS:  I'm not clear what the etiology of this is. However, without ongoing symptoms I don't think further monitoring will be indicated.    Current medicines are reviewed at length with the patient today.  The patient does not have concerns regarding medicines.  The following changes have been made:  no change  Labs/ tests ordered today include:    Orders Placed This Encounter  Procedures  . Exercise Tolerance Test     Disposition:   FU with me as needed.      Signed, Rollene Rotunda, MD  06/06/2015 3:29 PM    [facility]

## 2015-06-05 NOTE — Patient Instructions (Signed)
Your physician recommends that you schedule a follow-up appointment As needed  Your physician has requested that you have an exercise tolerance test. For further information please visit [email or web address] Please also follow instruction sheet, as given.

## 2015-06-10 ENCOUNTER — Telehealth: Payer: Self-pay | Admitting: Urgent Care

## 2015-06-10 MED ORDER — LISINOPRIL 10 MG PO TABS: 10.0000 mg | ORAL_TABLET | Freq: Every day | ORAL | Status: DC

## 2015-06-10 NOTE — Telephone Encounter (Signed)
Was unable to speak with patient. I would like her to start lisinopril. Please let her know. We'll use  qd. Also report that her labs including electrolytes, liver enzymes, blood sugar and kidney function level were unremarkable. Thank you!

## 2015-06-10 NOTE — Telephone Encounter (Signed)
-----   Message from Gerrianne Scale sent at 06/08/2015 12:23 PM EDT ----- Spoke with pt. Gave pt results she states that every since she started taking the HCTZ she has been having muscle cramps but when she stop taking it Sat. And Sun. She didn't have any more cramps she would like new RX

## 2015-06-11 NOTE — Telephone Encounter (Signed)
LMOM of the information

## 2015-06-24 NOTE — Progress Notes (Signed)
Patient ID: Michele Garcia, female   DOB: [birth year], 49 y.o.   MRN: [id number] Reviewed documentation, xray and EKG. I agree w/ assessment and plan. Norberto Sorenson, MD MPH

## 2015-06-26 ENCOUNTER — Telehealth (HOSPITAL_COMMUNITY): Payer: Self-pay | Admitting: Radiology

## 2015-06-26 NOTE — Telephone Encounter (Signed)
Encounter complete. 

## 2015-06-30 ENCOUNTER — Telehealth (HOSPITAL_COMMUNITY): Payer: Self-pay

## 2015-06-30 NOTE — Telephone Encounter (Signed)
Encounter complete. 

## 2015-07-01 ENCOUNTER — Inpatient Hospital Stay (HOSPITAL_COMMUNITY): Admission: RE | Admit: 2015-07-01 | Payer: Self-pay | Source: Ambulatory Visit

## 2016-06-29 ENCOUNTER — Ambulatory Visit (INDEPENDENT_AMBULATORY_CARE_PROVIDER_SITE_OTHER): Payer: Self-pay | Admitting: Physician Assistant

## 2016-06-29 VITALS — BP 136/84 | HR 82 | Temp 98.3°F | Resp 18 | Ht 65.0 in | Wt 261.0 lb

## 2016-06-29 DIAGNOSIS — G47419 Narcolepsy without cataplexy: Secondary | ICD-10-CM | POA: Insufficient documentation

## 2016-06-29 DIAGNOSIS — R0789 Other chest pain: Secondary | ICD-10-CM

## 2016-06-29 MED ORDER — MELOXICAM 15 MG PO TABS: 15.0000 mg | ORAL_TABLET | Freq: Every day | ORAL | 1 refills | Status: DC

## 2016-06-29 MED ORDER — CYCLOBENZAPRINE HCL 10 MG PO TABS: 10.0000 mg | ORAL_TABLET | Freq: Three times a day (TID) | ORAL | 0 refills | Status: DC | PRN

## 2016-06-29 NOTE — Patient Instructions (Signed)
     IF you received an x-ray today, you will receive an invoice from [facility]. Please contact [facility] at [phone] with questions or concerns regarding your invoice.   IF you received labwork today, you will receive an invoice from [facility]. Please contact [facility] at [phone] with questions or concerns regarding your invoice.   Our billing staff will not be able to assist you with questions regarding bills from these companies.  You will be contacted with the lab results as soon as they are available. The fastest way to get your results is to activate your My Chart account. Instructions are located on the last page of this paperwork. If you have not heard from us regarding the results in 2 weeks, please contact this office.      

## 2016-06-29 NOTE — Progress Notes (Signed)
Patient ID: Ryhanna Dunsmore, female    DOB: 01/02/1966, 50 y.o.   MRN: [id number]  PCP: No PCP Per Patient  Subjective:   Chief Complaint  Patient presents with  . Flank Pain    LEFT SIDE  . Headache    HPI Presents for evaluation of chest pain.  LEFT chest pain. NO PAIN IN THE FLANK AREA. Began 3 weeks ago. Initially felt like indigestion, now feels like a soreness, "I just want to rub it." Rubbing on it makes it feel better. Initially she also had a stiff neck. Sleep position impacts it. Has not taken anything other than aspirin, which she takes daily.  Works a strenuous job. Heavy lifting does aggravate the pain. Lifting the LEFT arm over her head increases the pain and she feels a "catch." "A pulled muscle feel."  Did not work this morning due to the pain and a headache. The headache felt like a sinus headache, improved with rubbing on it, pressure, like her head would explode. Had a headache like this during pregnancy and associated with elevated blood pressure. She no longer takes anti-hypertensive medication and has eliminated pork from her diet. 142/104 at home this morning. Doesn't check it regularly. At her dentists about a month ago for a filling, her BP was elevated, but not so high that the procedure (a filling) was rescheduled.  Headache resolved after taking some apple cider vinegar. Results were immediate.  Last week had an episode where the LEFT side of her body was weaker than usual. The LEFT eyeball keeps twitching. Feels like a spasm. Isn't sure if it's the eyelid or eyeball. Constantly sleepy. Dozes off frequently all day due to untreated narcolepsy. Doesn't drive due to narcolepsy. Standing up at work helps prevent her from falling asleep, but it's progressing.  Was seen at cardiology a year ago for a different type of chest pain. That was sharp. Didn't feel very good about the visit. The fire alarm went off and the visit essentially occurred  in the hallway. She was not aware that she had been scheduled for a stress test, and as such, she did not show. Continues to have the pain and palpitations. Is interested in seeing another provider for evaluation.  Review of Systems As above.     Patient Active Problem List   Diagnosis Date Noted  . 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 Other chest pain 06/04/2015  . Morbid obesity (HCC) 06/04/2015  . Breast pain, right 11/02/2012     Prior to Admission medications   Medication Sig Start Date End Date Taking? Authorizing Provider  aspirin EC 81 MG tablet Take 81 mg by mouth daily.   Yes Historical Provider, MD  hydrochlorothiazide (HYDRODIURIL) 12.5 MG tablet Take 1 tablet (12.5 mg total) by mouth daily. Patient not taking: Reported on 06/29/2016 06/04/15   Wallis Bamberg, PA-C  lisinopril (PRINIVIL,ZESTRIL) 10 MG tablet Take 1 tablet (10 mg total) by mouth daily. Patient not taking: Reported on 06/29/2016 06/10/15   Wallis Bamberg, PA-C  meloxicam (MOBIC) 7.5 MG tablet Take 1-2 tablets (7.5-15 mg total) by mouth daily. Patient not taking: Reported on 06/29/2016 06/04/15   Wallis Bamberg, PA-C     No Known Allergies     Objective:  Physical Exam  Constitutional: She is oriented to person, place, and time. She appears well-developed and well-nourished. She is active and cooperative. No distress.  BP 136/84 (BP Location: Right Arm, Patient Position: Sitting, Cuff Size: Large)   Pulse 82   Temp  98.3 F (36.8 C) (Oral)   Resp 18   Ht 5\' 5"  (1.651 m)   Wt 261 lb (118.4 kg)   LMP 12/18/2011   SpO2 97%   BMI 43.43 kg/m   HENT:  Head: Normocephalic and atraumatic.  Right Ear: Hearing normal.  Left Ear: Hearing normal.  Eyes: Conjunctivae are normal. No scleral icterus.  Neck: Normal range of motion. Neck supple. No thyromegaly present.  Cardiovascular: Normal rate, regular rhythm and normal heart sounds.   Pulses:      Radial pulses are 2+ on the right side, and 2+ on the left side.  Pulmonary/Chest:  Effort normal and breath sounds normal. She exhibits tenderness.  Lymphadenopathy:       Head (right side): No tonsillar, no preauricular, no posterior auricular and no occipital adenopathy present.       Head (left side): No tonsillar, no preauricular, no posterior auricular and no occipital adenopathy present.    She has no cervical adenopathy.       Right: No supraclavicular adenopathy present.       Left: No supraclavicular adenopathy present.  Neurological: She is alert and oriented to person, place, and time. No sensory deficit.  Skin: Skin is warm, dry and intact. No rash noted. No cyanosis or erythema. Nails show no clubbing.  Psychiatric: She has a normal mood and affect. Her speech is normal and behavior is normal.           Assessment & Plan:   1. Left-sided chest wall pain Musculoskeletal. Anti-inflammatory and muscle relaxer. Rest. - meloxicam (MOBIC) 15 MG tablet; Take 1 tablet (15 mg total) by mouth daily.  Dispense: 30 tablet; Refill: 1 - cyclobenzaprine (FLEXERIL) 10 MG tablet; Take 1 tablet (10 mg total) by mouth 3 (three) times daily as needed for muscle spasms.  Dispense: 30 tablet; Refill: 0  Recommend re-evaluation with cardiology for the other CP, for which stress testing was indicated due to her risk factors for cardiac disease, though previous cardiology notes indicate that the pain itself was unlikely to be cardiac in nature.  She will continue to follow-up with neurology for narcolepsy.  Fernande Brashelle S. Jesika Men, PA-C Physician Assistant-Certified [facility]

## 2016-07-05 ENCOUNTER — Emergency Department (HOSPITAL_COMMUNITY): Payer: Self-pay

## 2016-07-05 ENCOUNTER — Emergency Department (HOSPITAL_COMMUNITY)
Admission: EM | Admit: 2016-07-05 | Discharge: 2016-07-05 | Disposition: A | Discharge status: Home / Self Care | Payer: Self-pay | Attending: Emergency Medicine | Admitting: Emergency Medicine

## 2016-07-05 ENCOUNTER — Encounter (HOSPITAL_COMMUNITY): Payer: Self-pay | Admitting: Emergency Medicine

## 2016-07-05 DIAGNOSIS — R079 Chest pain, unspecified: Secondary | ICD-10-CM

## 2016-07-05 DIAGNOSIS — Z7982 Long term (current) use of aspirin: Secondary | ICD-10-CM | POA: Insufficient documentation

## 2016-07-05 DIAGNOSIS — R072 Precordial pain: Secondary | ICD-10-CM | POA: Insufficient documentation

## 2016-07-05 DIAGNOSIS — I10 Essential (primary) hypertension: Secondary | ICD-10-CM | POA: Insufficient documentation

## 2016-07-05 DIAGNOSIS — Z79899 Other long term (current) drug therapy: Secondary | ICD-10-CM | POA: Insufficient documentation

## 2016-07-05 LAB — BASIC METABOLIC PANEL
Anion gap: 8 (ref 5–15)
BUN: 12 mg/dL (ref 6–20)
CHLORIDE: 102 mmol/L (ref 101–111)
CO2: 26 mmol/L (ref 22–32)
CREATININE: 0.85 mg/dL (ref 0.44–1.00)
Calcium: 9.3 mg/dL (ref 8.9–10.3)
GFR calc Af Amer: >60 mL/min (ref >60)
GFR calc non Af Amer: >60 mL/min (ref >60)
Glucose, Bld: 103 mg/dL — ABNORMAL HIGH (ref 65–99)
Potassium: 3.7 mmol/L (ref 3.5–5.1)
SODIUM: 136 mmol/L (ref 135–145)

## 2016-07-05 LAB — CBC
HCT: 37.9 % (ref 36.0–46.0)
Hemoglobin: 12.1 g/dL (ref 12.0–15.0)
MCH: 27.7 pg (ref 26.0–34.0)
MCHC: 31.9 g/dL (ref 30.0–36.0)
MCV: 86.7 fL (ref 78.0–100.0)
PLATELETS: 274 10*3/uL (ref 150–400)
RBC: 4.37 MIL/uL (ref 3.87–5.11)
RDW: 12.5 % (ref 11.5–15.5)
WBC: 10.7 10*3/uL — ABNORMAL HIGH (ref 4.0–10.5)

## 2016-07-05 LAB — I-STAT TROPONIN, ED: Troponin i, poc: 0 ng/mL (ref 0.00–0.08)

## 2016-07-05 LAB — D-DIMER, QUANTITATIVE: D-Dimer, Quant: 0.37 ug/mL-FEU (ref 0.00–0.50)

## 2016-07-05 IMAGING — DX DG CHEST 2V
2 series · 2 of 2 positions shown · non-contrast
Comparison: [DATE]

CLINICAL DATA: Chest pain, recurrent for 4 days but worsened
tonight an accompanied by dyspnea.

EXAM:
CHEST  2 VIEW

[chest pa]
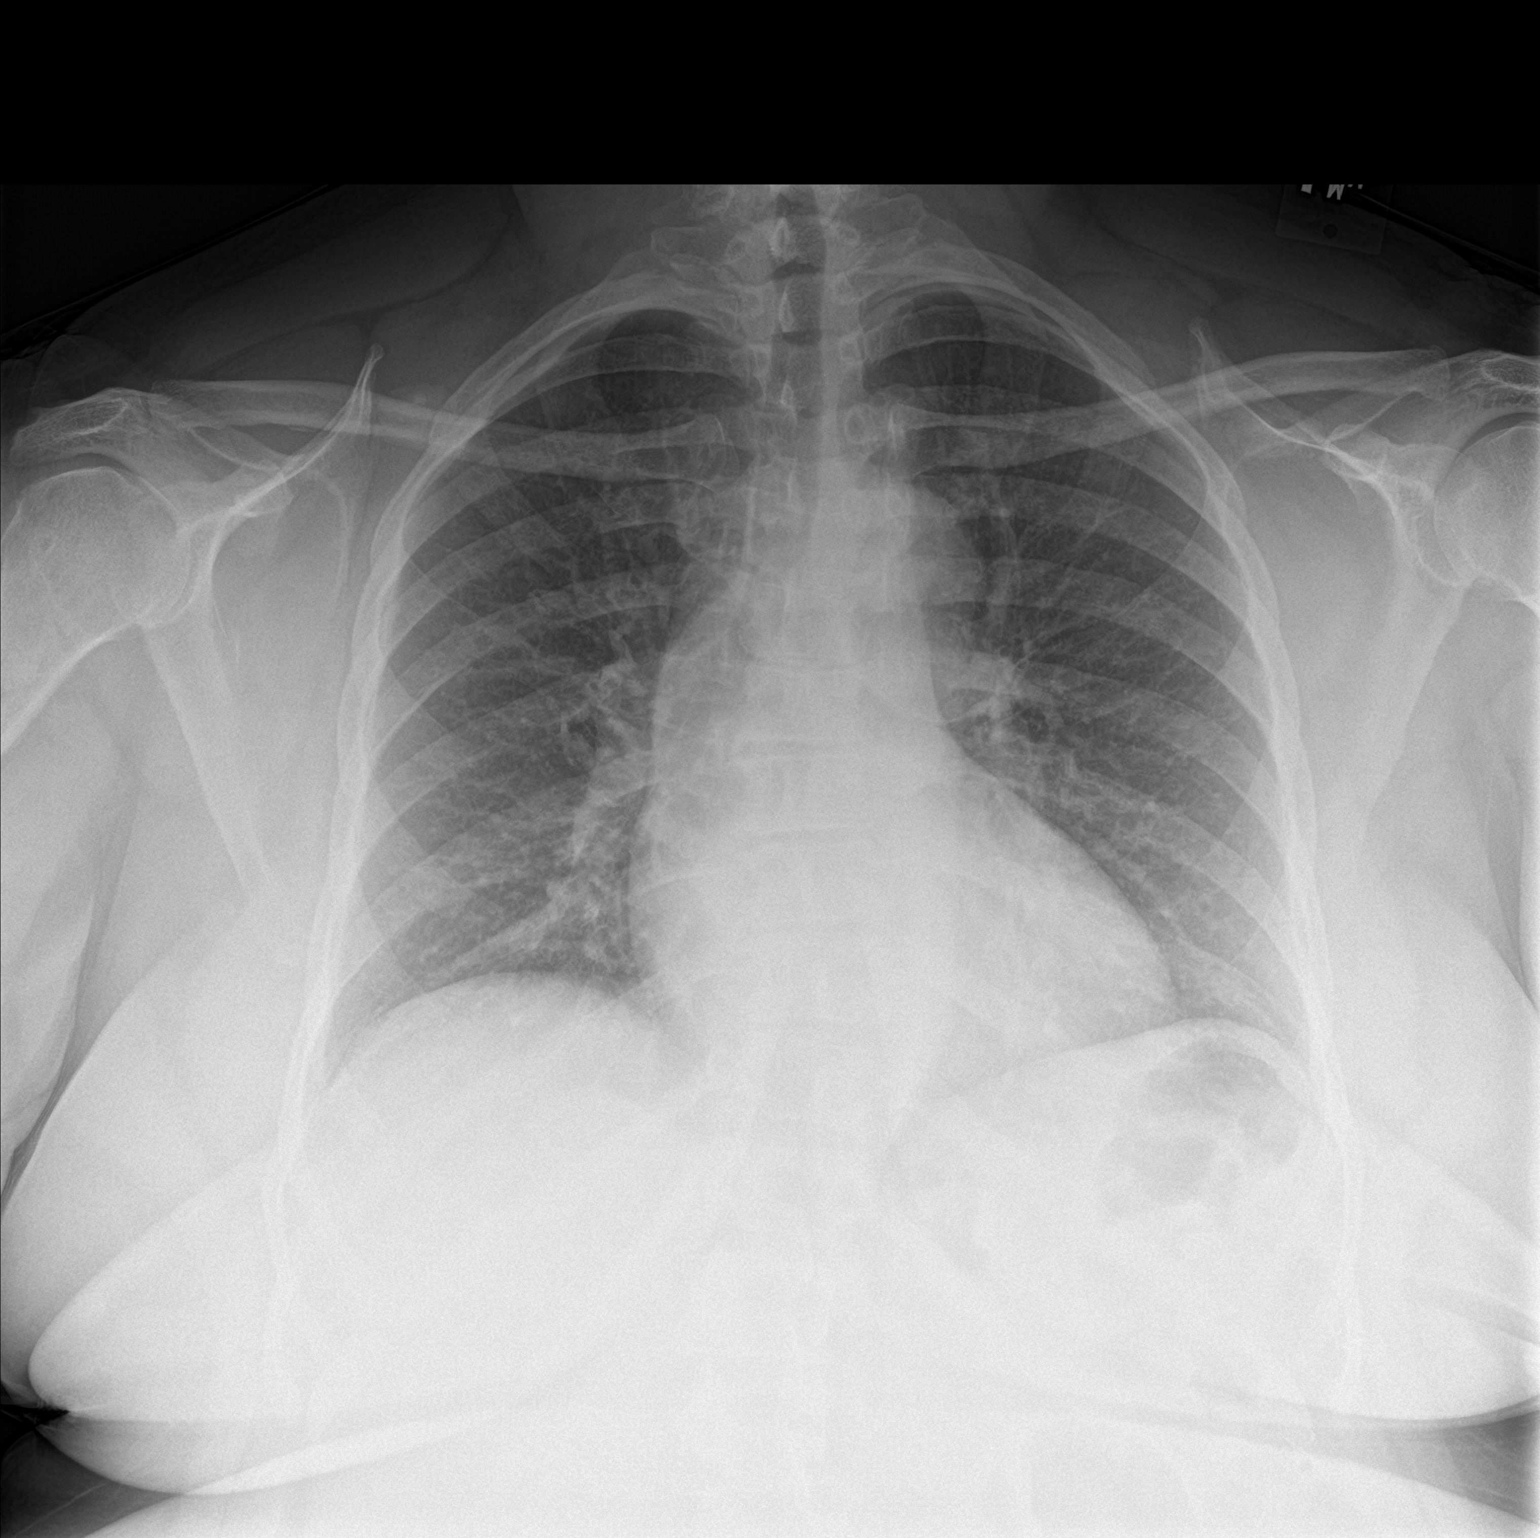

[chest lat]
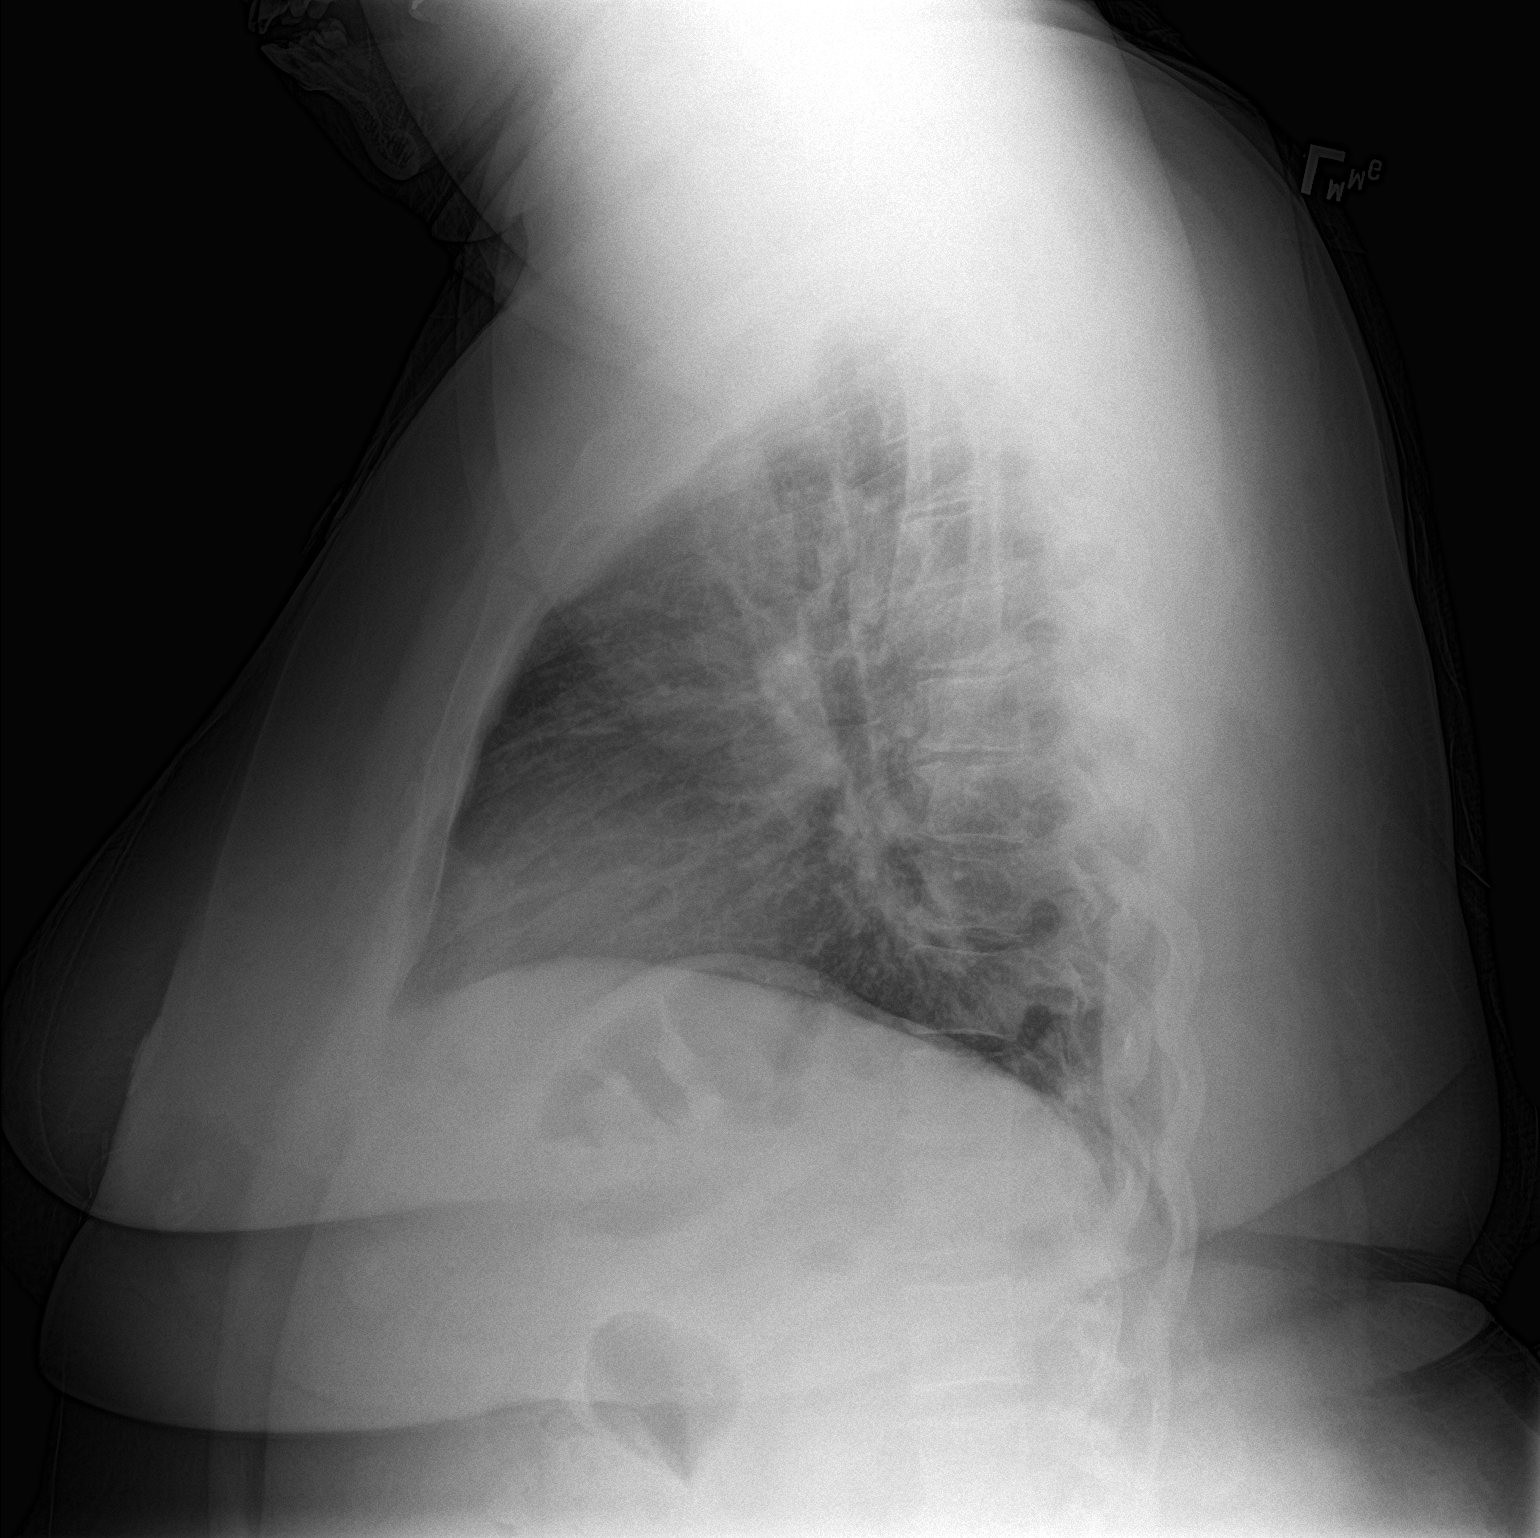

[2 of 2 positions shown; findings below may reference images not displayed]

FINDINGS: The heart size and mediastinal contours are within normal limits.
Both lungs are clear. The visualized skeletal structures are
unremarkable.
IMPRESSION: No active cardiopulmonary disease.

## 2016-07-05 MED ORDER — NAPROXEN 500 MG PO TABS: 500.0000 mg | ORAL_TABLET | Freq: Two times a day (BID) | ORAL | 0 refills | Status: DC

## 2016-07-05 MED ORDER — KETOROLAC TROMETHAMINE 30 MG/ML IJ SOLN
30.0000 mg | Freq: Once | INTRAMUSCULAR | Status: AC
  Administered 2016-07-05: 30 mg via INTRAMUSCULAR
  Filled 2016-07-05: qty 1

## 2016-07-05 NOTE — ED Provider Notes (Signed)
[facility]EMERGENCY DEPT Provider Note   CSN: [id number] Arrival date & time: 07/05/16  0032  By signing my name below, I, [name], attest that this documentation has been prepared under the direction and in the presence of Shon Baton, MD. Electronically Signed: [name], ED Scribe. 07/05/16. 3:56 AM.  History   Chief Complaint Chief Complaint  Patient presents with  . Chest Pain   The history is provided by the patient and medical records. No language interpreter was used.   HPI Comments: Michele Garcia is a 50 y.o. female with a PMHx of obesity, HTN, and HLD, who presents to the Emergency Department complaining of intermittent episodes of squeezing, mid-sternal chest pain x ~2 weeks. No pain while in the ED, and states that her last episode of pain woke her up from her sleep prompting her to come into the ED. Pt notes that each episode of pain lasts for approximately ~1 minute. Associated symptoms include sensation of palpitations, SOB, and left arm numbness secondary to these episodes of pain and all resolve with resolution of her pain.  She reports muscle relaxants to help some. Pt was seen at [facility] for same on 06/29/16 where she was rx'd 10mg  Flexeril and 15mg  Meloxicam for her pain. Patient reports that she was supposed to have a stress test last year but did not.  Patient does do heavy lifting at work. No Hx of PE/DVT, recent long travel, surgery, fracture, prolonged immobilization, hormone use. No hx of smoking. Pt has FHx of MI. Denies diaphoresis, abdominal pain, nausea, vomiting, or any other associated symptoms.   Past Medical History:  Diagnosis Date  . Hyperlipidemia   . Hypertension   . Narcolepsy    Patient Active Problem List   Diagnosis Date Noted  . 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 Other chest pain 06/04/2015  . Morbid obesity (HCC) 06/04/2015  . Breast pain, right 11/02/2012   Past Surgical History:  Procedure Laterality Date  . ABDOMINAL  HYSTERECTOMY    . CESAREAN SECTION     x 2  . dermoid tumor     OB History    Gravida Para Term Preterm AB Living   4 2 2   2 2    SAB TAB Ectopic Multiple Live Births                 Home Medications    Prior to Admission medications   Medication Sig Start Date End Date Taking? Authorizing Provider  aspirin EC 81 MG tablet Take 81 mg by mouth daily.   Yes Historical Provider, MD  cyclobenzaprine (FLEXERIL) 10 MG tablet Take 1 tablet (10 mg total) by mouth 3 (three) times daily as needed for muscle spasms. 06/29/16  Yes Chelle Jeffery, PA-C  hydrochlorothiazide (HYDRODIURIL) 12.5 MG tablet Take 1 tablet (12.5 mg total) by mouth daily. Patient not taking: Reported on 06/29/2016 06/04/15   Wallis Bamberg, PA-C  lisinopril (PRINIVIL,ZESTRIL) 10 MG tablet Take 1 tablet (10 mg total) by mouth daily. Patient not taking: Reported on 06/29/2016 06/10/15   Wallis Bamberg, PA-C  naproxen (NAPROSYN) 500 MG tablet Take 1 tablet (500 mg total) by mouth 2 (two) times daily. 07/05/16   Shon Baton, MD   Family History Family History  Problem Relation Age of Onset  . Cancer Mother 32    breast  . Cancer Maternal Grandmother     breast  . Diabetes Maternal Grandmother   . Diabetes Maternal Grandfather   . Stroke Maternal Grandfather   .  Hypertension Maternal Grandfather    Social History Social History  Substance Use Topics  . Smoking status: Never Smoker  . Smokeless tobacco: Never Used  . Alcohol use No   Allergies   Review of patient's allergies indicates no known allergies.  Review of Systems Review of Systems  Constitutional: Negative for diaphoresis.  Respiratory: Positive for shortness of breath.   Cardiovascular: Positive for chest pain and palpitations. Negative for leg swelling.  Gastrointestinal: Negative for abdominal pain, nausea and vomiting.  Neurological: Positive for numbness.  All other systems reviewed and are negative.  Physical Exam Updated Vital Signs BP 133/88    Pulse 74   Temp 97.2 F (36.2 C) (Oral)   Resp 20   LMP 12/18/2011   SpO2 96%   Physical Exam  Constitutional: She is oriented to person, place, and time. She appears well-developed and well-nourished.  Overweight  HENT:  Head: Normocephalic and atraumatic.  Cardiovascular: Normal rate, regular rhythm and normal heart sounds.   Pulmonary/Chest: Effort normal and breath sounds normal. No respiratory distress. She has no wheezes. She exhibits tenderness.  Abdominal: Soft. Bowel sounds are normal. There is no tenderness. There is no guarding.  Musculoskeletal: She exhibits edema.  Trace symmetric bilateral lower extremity edema  Neurological: She is alert and oriented to person, place, and time.  Skin: Skin is warm and dry.  Psychiatric: She has a normal mood and affect.  Nursing note and vitals reviewed.  ED Treatments / Results  DIAGNOSTIC STUDIES: Oxygen Saturation is 98% on RA, normal by my interpretation.   COORDINATION OF CARE: 3:56 AM-Discussed next steps with pt. Pt verbalized understanding and is agreeable with the plan.   Labs (all labs ordered are listed, but only abnormal results are displayed) Labs Reviewed  BASIC METABOLIC PANEL - Abnormal; Notable for the following:       Result Value   Glucose, Bld 103 (*)    All other components within normal limits  CBC - Abnormal; Notable for the following:    WBC 10.7 (*)    All other components within normal limits  D-DIMER, QUANTITATIVE (NOT AT [facility])  Rosezena Sensor, ED   EKG  EKG Interpretation  Date/Time:  Tuesday July 05 2016 00:36:37 EDT Ventricular Rate:  74 PR Interval:  136 QRS Duration: 78 QT Interval:  382 QTC Calculation: 424 R Axis:   30 Text Interpretation:  Sinus rhythm with sinus arrhythmia with occasional Premature ventricular complexes Otherwise normal ECG Confirmed by Wilkie Aye  MD, Toni Amend ([id number]) on 07/05/2016 3:10:37 AM Also confirmed by Wilkie Aye  MD, COURTNEY ([id number])  on 07/05/2016 4:12:24  AM      Radiology Dg Chest 2 View  Result Date: 07/05/2016 CLINICAL DATA:  Chest pain, recurrent for 4 days but worsened tonight an accompanied by dyspnea. EXAM: CHEST  2 VIEW COMPARISON:  06/04/2015 FINDINGS: The heart size and mediastinal contours are within normal limits. Both lungs are clear. The visualized skeletal structures are unremarkable. IMPRESSION: No active cardiopulmonary disease. Electronically Signed   By: Ellery Plunk M.D.   On: 07/05/2016 01:20   Procedures Procedures (including critical care time)  Medications Ordered in ED Medications  ketorolac (TORADOL) 30 MG/ML injection 30 mg (30 mg Intramuscular Given 07/05/16 0437)    Initial Impression / Assessment and Plan / ED Course  I have reviewed the triage vital signs and the nursing notes.  Pertinent labs & imaging results that were available during my care of the patient were reviewed by me and considered in  my medical decision making (see chart for details).  Clinical Course   This is a 50 year old female who presents with chest pain. Seen and evaluated for the same urgent care last week and diagnosed with muscle pain.Reports recurrent of symptoms.Currently without pain.  Pain is reproducible on exam.  He is nontoxic. Afebrile. No evidence of ischemia on EKG.D-dimer sent and negative. Chest x-ray unremarkable. Troponin negative.Doubt ACS as story is very atypical; however, would encourage patient to follow back up with cardiology for stress testing given risk factors.Will change meloxicam to naproxen twice a day.  After history, exam, and medical workup I feel the patient has been appropriately medically screened and is safe for discharge home. Pertinent diagnoses were discussed with the patient. Patient was given return precautions.   Final Clinical Impressions(s) / ED Diagnoses   Final diagnoses:  Nonspecific chest pain    New Prescriptions New Prescriptions   NAPROXEN (NAPROSYN) 500 MG TABLET    Take 1  tablet (500 mg total) by mouth 2 (two) times daily.    I personally performed the services described in this documentation, which was scribed in my presence. The recorded information has been reviewed and is accurate.     Shon Batonourtney F Horton, MD 07/05/16 [phone]

## 2016-07-05 NOTE — ED Notes (Signed)
Dr. Horton at bedside. 

## 2016-07-05 NOTE — ED Triage Notes (Signed)
Patient arrives with c/o intermittent chest pain ongoing x3 weeks, described as squeezing. States seen at urgent care for same, attributed to muscle skeletal pain and RXed with muscle relaxer and antiinflammatory, which only put her to sleep. Reports pian is substernal, described as squeezing. Denies pain at this time. Reports she came in tonight because it woke her from her sleep, which is new. Patient denies pain at this time. Hx narcolepsy.

## 2016-12-26 ENCOUNTER — Other Ambulatory Visit: Payer: Self-pay | Admitting: Family Medicine

## 2016-12-26 ENCOUNTER — Ambulatory Visit
Admission: RE | Admit: 2016-12-26 | Discharge: 2016-12-26 | Disposition: A | Discharge status: Home / Self Care | Payer: BLUE CROSS/BLUE SHIELD | Source: Ambulatory Visit | Attending: Family Medicine | Admitting: Family Medicine

## 2016-12-26 DIAGNOSIS — R1084 Generalized abdominal pain: Secondary | ICD-10-CM

## 2016-12-26 IMAGING — CR DG ABDOMEN 1V
1 series · 1 of 1 positions shown · non-contrast
Comparison: CT Abdomen and Pelvis [DATE] and earlier.

CLINICAL DATA: 50-year-old female with abdominal pain for 6 months.
Initial encounter. Pelvic abscess in [XC] treated with trans gluteal
drain.

EXAM:
ABDOMEN - 1 VIEW

[t abdomen supine]
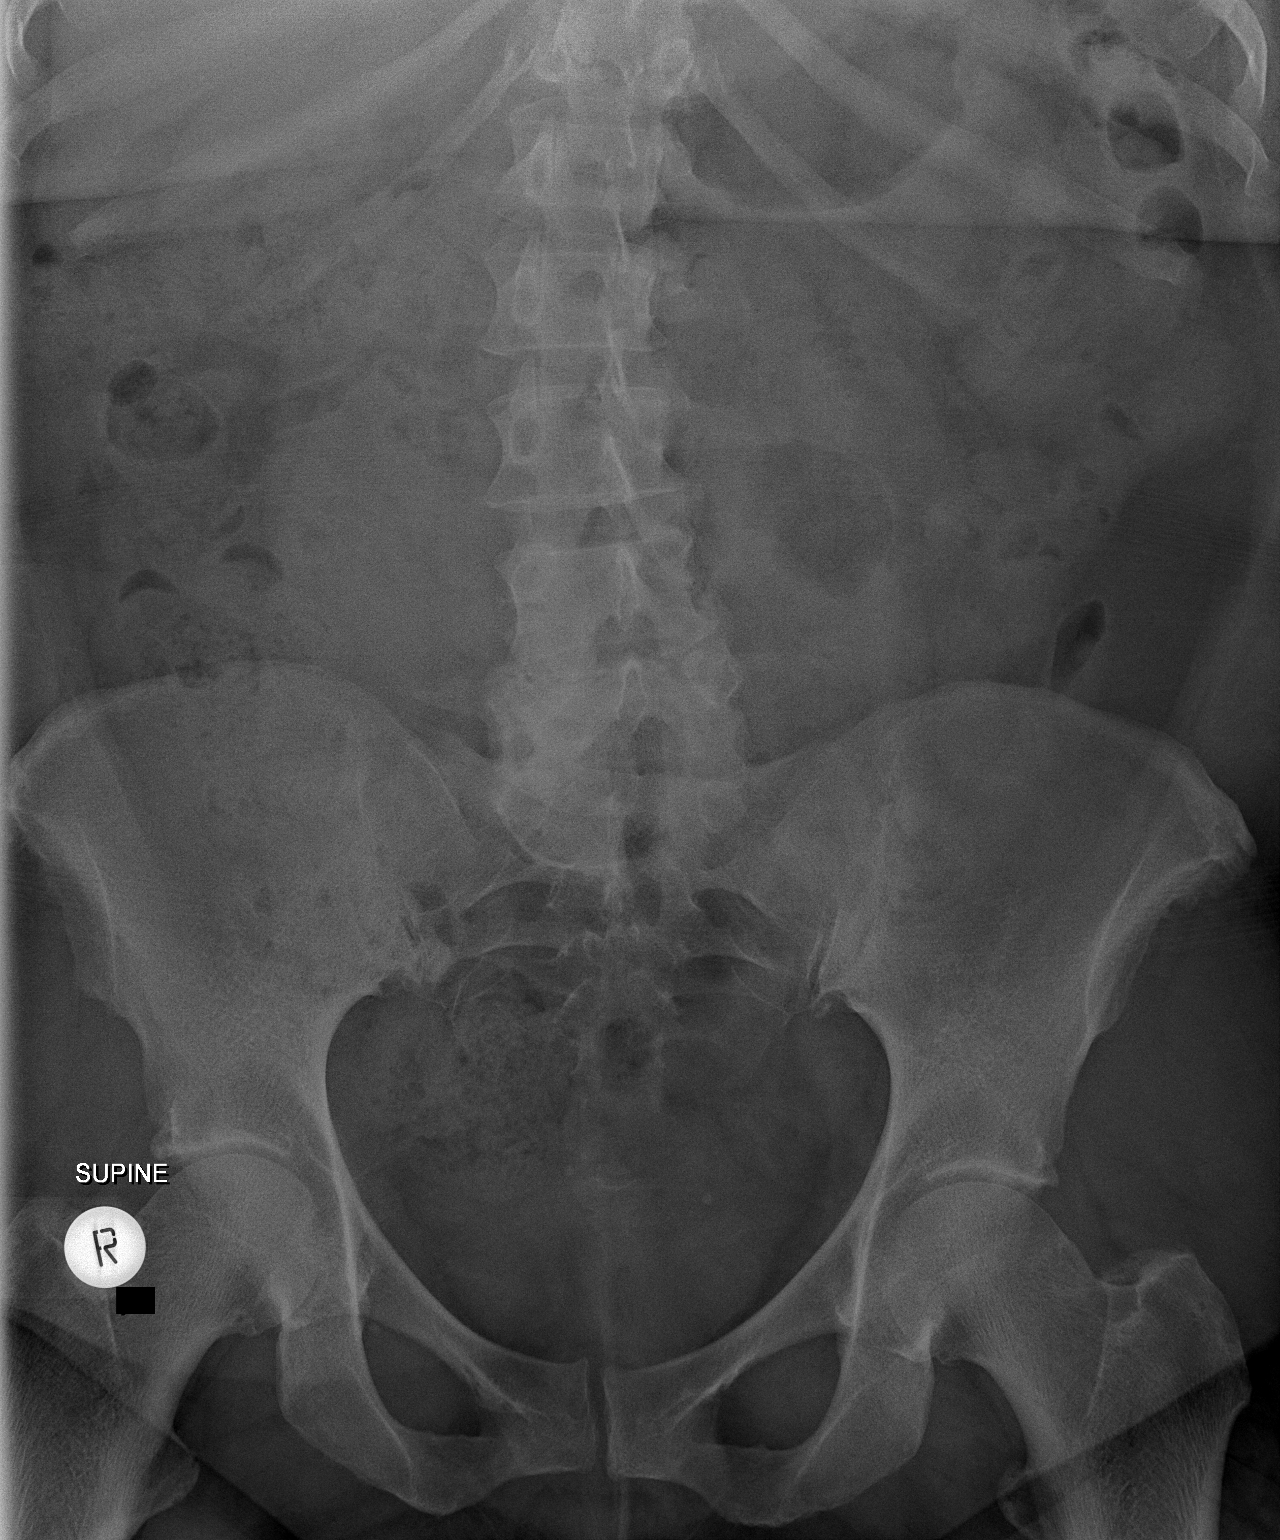

[1 of 1 positions shown; findings below may reference images not displayed]

FINDINGS: Supine view of the abdomen. Mild dextroconvex lumbar scoliosis has
not significantly changed. Chronic right side L4-L5 endplate and
facet degeneration. No acute osseous abnormality identified. Non
obstructed bowel gas pattern. Abdominal and pelvic visceral contours
are within normal limits. Occasional tiny pelvic phleboliths.
IMPRESSION: Negative.

## 2017-07-23 ENCOUNTER — Emergency Department (HOSPITAL_COMMUNITY)
Admission: EM | Admit: 2017-07-23 | Discharge: 2017-07-23 | Disposition: A | Discharge status: Home / Self Care | Payer: BLUE CROSS/BLUE SHIELD | Attending: Emergency Medicine | Admitting: Emergency Medicine

## 2017-07-23 ENCOUNTER — Emergency Department (HOSPITAL_COMMUNITY): Payer: BLUE CROSS/BLUE SHIELD

## 2017-07-23 ENCOUNTER — Encounter (HOSPITAL_COMMUNITY): Payer: Self-pay | Admitting: Emergency Medicine

## 2017-07-23 DIAGNOSIS — I10 Essential (primary) hypertension: Secondary | ICD-10-CM | POA: Insufficient documentation

## 2017-07-23 DIAGNOSIS — R0600 Dyspnea, unspecified: Secondary | ICD-10-CM | POA: Insufficient documentation

## 2017-07-23 DIAGNOSIS — Z791 Long term (current) use of non-steroidal anti-inflammatories (NSAID): Secondary | ICD-10-CM | POA: Insufficient documentation

## 2017-07-23 DIAGNOSIS — R0602 Shortness of breath: Secondary | ICD-10-CM | POA: Insufficient documentation

## 2017-07-23 LAB — BASIC METABOLIC PANEL
ANION GAP: 8 (ref 5–15)
BUN: 13 mg/dL (ref 6–20)
CALCIUM: 9.6 mg/dL (ref 8.9–10.3)
CO2: 27 mmol/L (ref 22–32)
CREATININE: 1.07 mg/dL — AB (ref 0.44–1.00)
Chloride: 101 mmol/L (ref 101–111)
GFR calc Af Amer: >60 mL/min (ref >60)
GFR calc non Af Amer: 60 mL/min — ABNORMAL LOW (ref >60)
GLUCOSE: 112 mg/dL — AB (ref 65–99)
Potassium: 3.8 mmol/L (ref 3.5–5.1)
Sodium: 136 mmol/L (ref 135–145)

## 2017-07-23 LAB — CBC
HCT: 40.8 % (ref 36.0–46.0)
Hemoglobin: 13.1 g/dL (ref 12.0–15.0)
MCH: 27.4 pg (ref 26.0–34.0)
MCHC: 32.1 g/dL (ref 30.0–36.0)
MCV: 85.4 fL (ref 78.0–100.0)
PLATELETS: 280 10*3/uL (ref 150–400)
RBC: 4.78 MIL/uL (ref 3.87–5.11)
RDW: 12.5 % (ref 11.5–15.5)
WBC: 8.2 10*3/uL (ref 4.0–10.5)

## 2017-07-23 LAB — URINALYSIS, ROUTINE W REFLEX MICROSCOPIC
Bilirubin Urine: NEGATIVE
Glucose, UA: NEGATIVE mg/dL
Hgb urine dipstick: NEGATIVE
KETONES UR: NEGATIVE mg/dL
Leukocytes, UA: NEGATIVE
NITRITE: NEGATIVE
PH: 7 (ref 5.0–8.0)
PROTEIN: 100 mg/dL — AB
Specific Gravity, Urine: 1.025 (ref 1.005–1.030)

## 2017-07-23 IMAGING — DX DG CHEST 2V
2 series · 2 of 2 positions shown · non-contrast
Comparison: [DATE] radiographs

CLINICAL DATA: Shortness of breath for 4 days.

EXAM:
CHEST  2 VIEW

[chest lat]
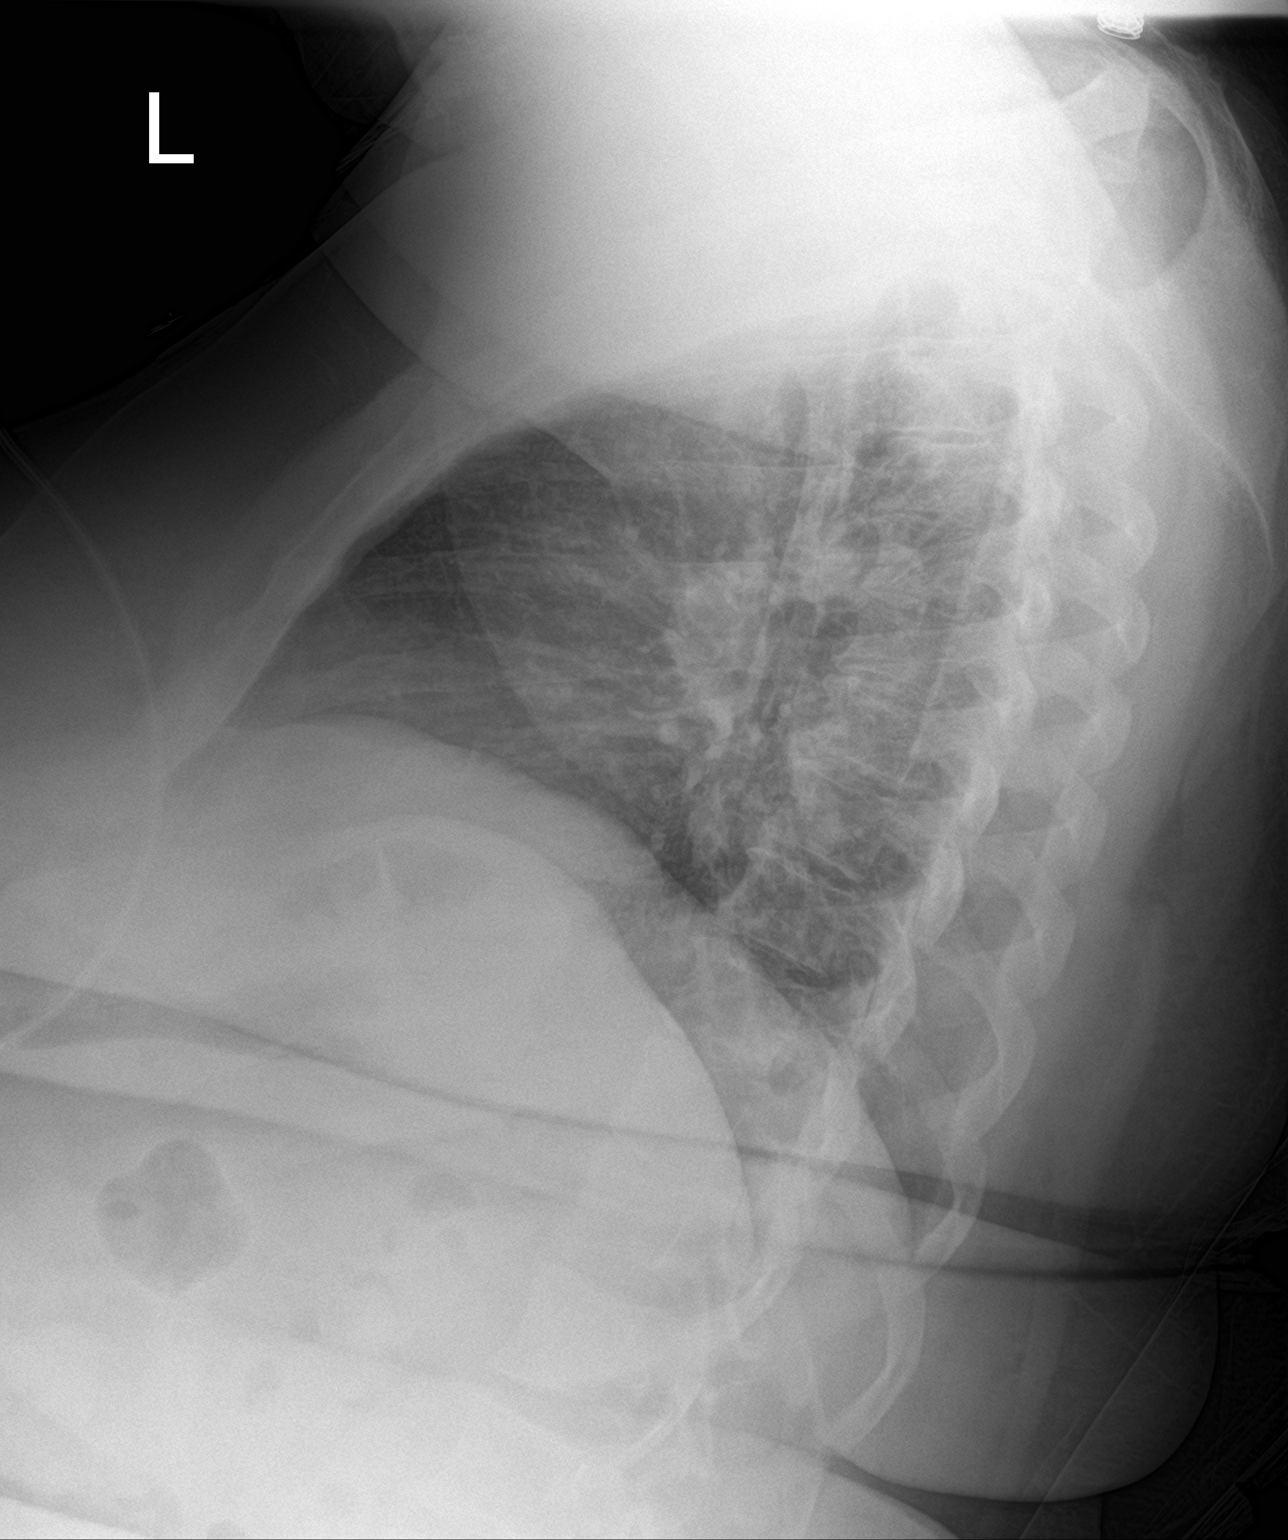

[chest ap]
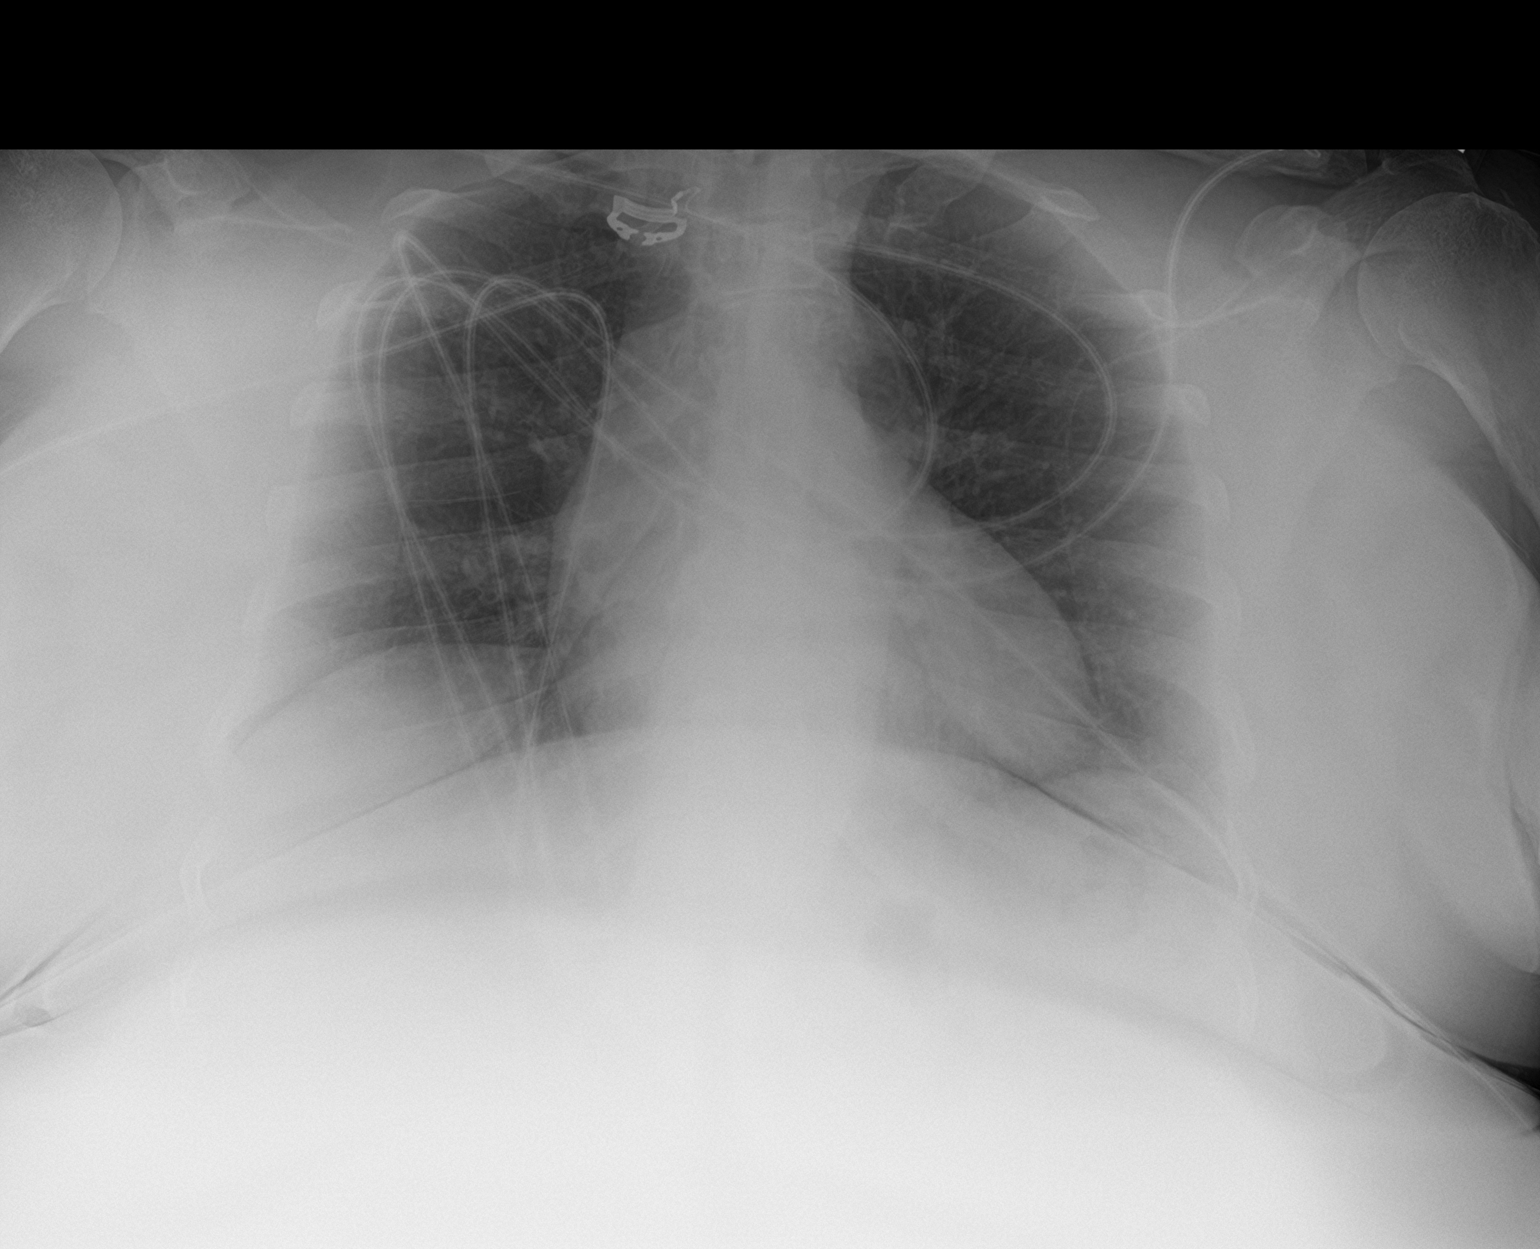

[2 of 2 positions shown; findings below may reference images not displayed]

FINDINGS: Cardiomegaly noted.

There is no evidence of focal airspace disease, pulmonary edema,
suspicious pulmonary nodule/mass, pleural effusion, or pneumothorax.
No acute bony abnormalities are identified.
IMPRESSION: Cardiomegaly without evidence of acute cardiopulmonary disease.

## 2017-07-23 MED ORDER — HYDROCHLOROTHIAZIDE 12.5 MG PO TABS: 12.5000 mg | ORAL_TABLET | Freq: Every day | ORAL | 0 refills | Status: DC

## 2017-07-23 MED ORDER — LISINOPRIL 10 MG PO TABS: 10.0000 mg | ORAL_TABLET | Freq: Every day | ORAL | 0 refills | Status: DC

## 2017-07-23 NOTE — ED Provider Notes (Signed)
[facility] Provider Note   CSN: [id number] Arrival date & time: 07/23/17  1029     History   Chief Complaint Chief Complaint  Patient presents with  . Shortness of Breath  . Hypertension  . Dizziness    HPI Michele Garcia is a 51 y.o. female.  HPI Patient is a 51 year old female that presents complaining of some elevated blood pressure.  She states it gives her headache and makes her feel dizzy.  She is unable to describe dizzy any further.  This is not described as lightheadedness.  She reports some occasional shortness of breath but no shortness of breath at this time.  She was seen in the walk-in clinic and sent to the ER for further evaluation.  Reports no chest pain or shortness of breath this time.  No weakness of her arms or legs.  No headache.  Patient reports she's been eating and drinking normally. Past Medical History:  Diagnosis Date  . Hyperlipidemia   . Hypertension   . Narcolepsy     Patient Active Problem List   Diagnosis Date Noted  . 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 Other chest pain 06/04/2015  . Morbid obesity (HCC) 06/04/2015  . Breast pain, right 11/02/2012    Past Surgical History:  Procedure Laterality Date  . ABDOMINAL HYSTERECTOMY    . CESAREAN SECTION     x 2  . dermoid tumor      OB History    Gravida Para Term Preterm AB Living   SAB TAB Ectopic Multiple Live Births                   Home Medications    Prior to Admission medications   Medication Sig Start Date End Date Taking? Authorizing Provider  aspirin EC 81 MG tablet Take 81 mg by mouth daily as needed for mild pain.    Yes [provider]  naproxen sodium (ANAPROX) 220 MG tablet Take 440 mg by mouth daily as needed (for pain).   Yes [provider]  Psyllium (METAMUCIL FIBER PO) Take 1 tablet by mouth daily as needed (for bowl movement).   Yes [provider]  cyclobenzaprine (FLEXERIL) 10 MG tablet Take 1 tablet (10 mg  total) by mouth 3 (three) times daily as needed for muscle spasms. 06/29/16   Porfirio Oar, PA-C  hydrochlorothiazide (HYDRODIURIL) 12.5 MG tablet Take 1 tablet (12.5 mg total) by mouth daily. Patient not taking: Reported on 06/29/2016 06/04/15   Wallis Bamberg, PA-C  lisinopril (PRINIVIL,ZESTRIL) 10 MG tablet Take 1 tablet (10 mg total) by mouth daily. Patient not taking: Reported on 06/29/2016 06/10/15   Wallis Bamberg, PA-C  naproxen (NAPROSYN) 500 MG tablet Take 1 tablet (500 mg total) by mouth 2 (two) times daily. 07/05/16   Horton, Mayer Masker, MD    Family History Family History  Problem Relation Age of Onset  . Cancer Mother 66       breast  . Cancer Maternal Grandmother        breast  . Diabetes Maternal Grandmother   . Diabetes Maternal Grandfather   . Stroke Maternal Grandfather   . Hypertension Maternal Grandfather     Social History Social History  Substance Use Topics  . Smoking status: Never Smoker  . Smokeless tobacco: Never Used  . Alcohol use No     Allergies   Patient has no known allergies.   Review of Systems Review of Systems  All other systems reviewed and are negative.    Physical Exam Updated Vital Signs BP 118/64   Pulse 78   Temp 98.7 F (37.1 C)   Resp (!) 22   Ht  (1.651 m)   Wt 122 kg (269 lb)   LMP 12/18/2011   SpO2 99%   BMI 44.76 kg/m   Physical Exam  Constitutional: She is oriented to person, place, and time. She appears well-developed and well-nourished.  HENT:  Head: Normocephalic and atraumatic.  Eyes: Pupils are equal, round, and reactive to light. EOM are normal.  Neck: Normal range of motion.  Cardiovascular: Normal rate and regular rhythm.   Pulmonary/Chest: Effort normal.  Abdominal: Soft. She exhibits no distension.  Musculoskeletal: Normal range of motion.  Neurological: She is alert and oriented to person, place, and time.  5/5 strength in major muscle groups of  bilateral upper and lower extremities. Speech normal.  No facial asymetry.   Psychiatric: She has a normal mood and affect.  Nursing note and vitals reviewed.    ED Treatments / Results  Labs (all labs ordered are listed, but only abnormal results are displayed) Labs Reviewed  BASIC METABOLIC PANEL - Abnormal; Notable for the following:       Result Value   Glucose, Bld 112 (*)    Creatinine, Ser 1.07 (*)    GFR calc non Af Amer 60 (*)    All other components within normal limits  URINALYSIS, ROUTINE W REFLEX MICROSCOPIC - Abnormal; Notable for the following:    APPearance HAZY (*)    Protein, ur 100 (*)    Bacteria, UA RARE (*)    Squamous Epithelial / LPF 0-5 (*)    All other components within normal limits  CBC  CBG MONITORING, ED    EKG  EKG Interpretation None       Radiology Dg Chest 2 View  Result Date: 07/23/2017 CLINICAL DATA:  Shortness of breath for 4 days. EXAM: CHEST  2 VIEW COMPARISON:  07/05/2016 radiographs FINDINGS: Cardiomegaly noted. There is no evidence of focal airspace disease, pulmonary edema, suspicious pulmonary nodule/mass, pleural effusion, or pneumothorax. No acute bony abnormalities are identified. IMPRESSION: Cardiomegaly without evidence of acute cardiopulmonary disease. Electronically Signed   By: Harmon Pier M.D.   On: 07/23/2017 15:17    Procedures Procedures (including critical care time)  Medications Ordered in ED Medications - No data to display   Initial Impression / Assessment and Plan / ED Course  I have reviewed the triage vital signs and the nursing notes.  Pertinent labs & imaging results that were available during my care of the patient were reviewed by me and considered in my medical decision making (see chart for details).     Overall well-appearing.  Discharge home in good condition.  We'll initiate on a blood pressure medicine.  Primary care follow-up.  Final Clinical Impressions(s) / ED Diagnoses   Final diagnoses:  Hypertension, unspecified type  Dyspnea,  unspecified type    New Prescriptions New Prescriptions   No medications on file     Azalia Bilis, MD 07/26/17 [phone]

## 2017-07-23 NOTE — ED Triage Notes (Signed)
Pt. Stated, My BP has been up and Im feeling dizzy.  Im also having some SOB. I was sent here from the [facility] [facility]

## 2017-07-23 NOTE — Discharge Instructions (Signed)
Keep your self hydrated with water  Follow up with your doctor for recheck of your blood pressure  Exercise daily  Eat a well balanced diet

## 2017-08-14 ENCOUNTER — Other Ambulatory Visit: Payer: Self-pay | Admitting: Family Medicine

## 2017-08-14 DIAGNOSIS — Z1231 Encounter for screening mammogram for malignant neoplasm of breast: Secondary | ICD-10-CM

## 2017-09-01 ENCOUNTER — Ambulatory Visit
Admission: RE | Admit: 2017-09-01 | Discharge: 2017-09-01 | Disposition: A | Discharge status: Home / Self Care | Payer: BLUE CROSS/BLUE SHIELD | Source: Ambulatory Visit | Attending: Family Medicine | Admitting: Family Medicine

## 2017-09-01 DIAGNOSIS — Z1231 Encounter for screening mammogram for malignant neoplasm of breast: Secondary | ICD-10-CM

## 2018-11-26 ENCOUNTER — Emergency Department (HOSPITAL_BASED_OUTPATIENT_CLINIC_OR_DEPARTMENT_OTHER): Payer: PRIVATE HEALTH INSURANCE

## 2018-11-26 ENCOUNTER — Encounter (HOSPITAL_COMMUNITY): Payer: Self-pay | Admitting: *Deleted

## 2018-11-26 ENCOUNTER — Emergency Department (HOSPITAL_COMMUNITY): Payer: PRIVATE HEALTH INSURANCE

## 2018-11-26 ENCOUNTER — Emergency Department (HOSPITAL_COMMUNITY)
Admission: EM | Admit: 2018-11-26 | Discharge: 2018-11-26 | Disposition: A | Discharge status: Home / Self Care | Payer: PRIVATE HEALTH INSURANCE | Attending: Emergency Medicine | Admitting: Emergency Medicine

## 2018-11-26 DIAGNOSIS — E785 Hyperlipidemia, unspecified: Secondary | ICD-10-CM | POA: Diagnosis not present

## 2018-11-26 DIAGNOSIS — Z79899 Other long term (current) drug therapy: Secondary | ICD-10-CM | POA: Insufficient documentation

## 2018-11-26 DIAGNOSIS — R2242 Localized swelling, mass and lump, left lower limb: Secondary | ICD-10-CM | POA: Insufficient documentation

## 2018-11-26 DIAGNOSIS — M7989 Other specified soft tissue disorders: Secondary | ICD-10-CM

## 2018-11-26 DIAGNOSIS — I10 Essential (primary) hypertension: Secondary | ICD-10-CM | POA: Insufficient documentation

## 2018-11-26 DIAGNOSIS — M25562 Pain in left knee: Secondary | ICD-10-CM | POA: Diagnosis not present

## 2018-11-26 DIAGNOSIS — M79609 Pain in unspecified limb: Secondary | ICD-10-CM

## 2018-11-26 IMAGING — DX DG KNEE COMPLETE 4+V*L*
4 series · 4 of 4 positions shown · non-contrast
Comparison: None.

CLINICAL DATA: Pain around patellar region.  No known injury.

EXAM:
LEFT KNEE - COMPLETE 4+ VIEW

[w knee ap left]
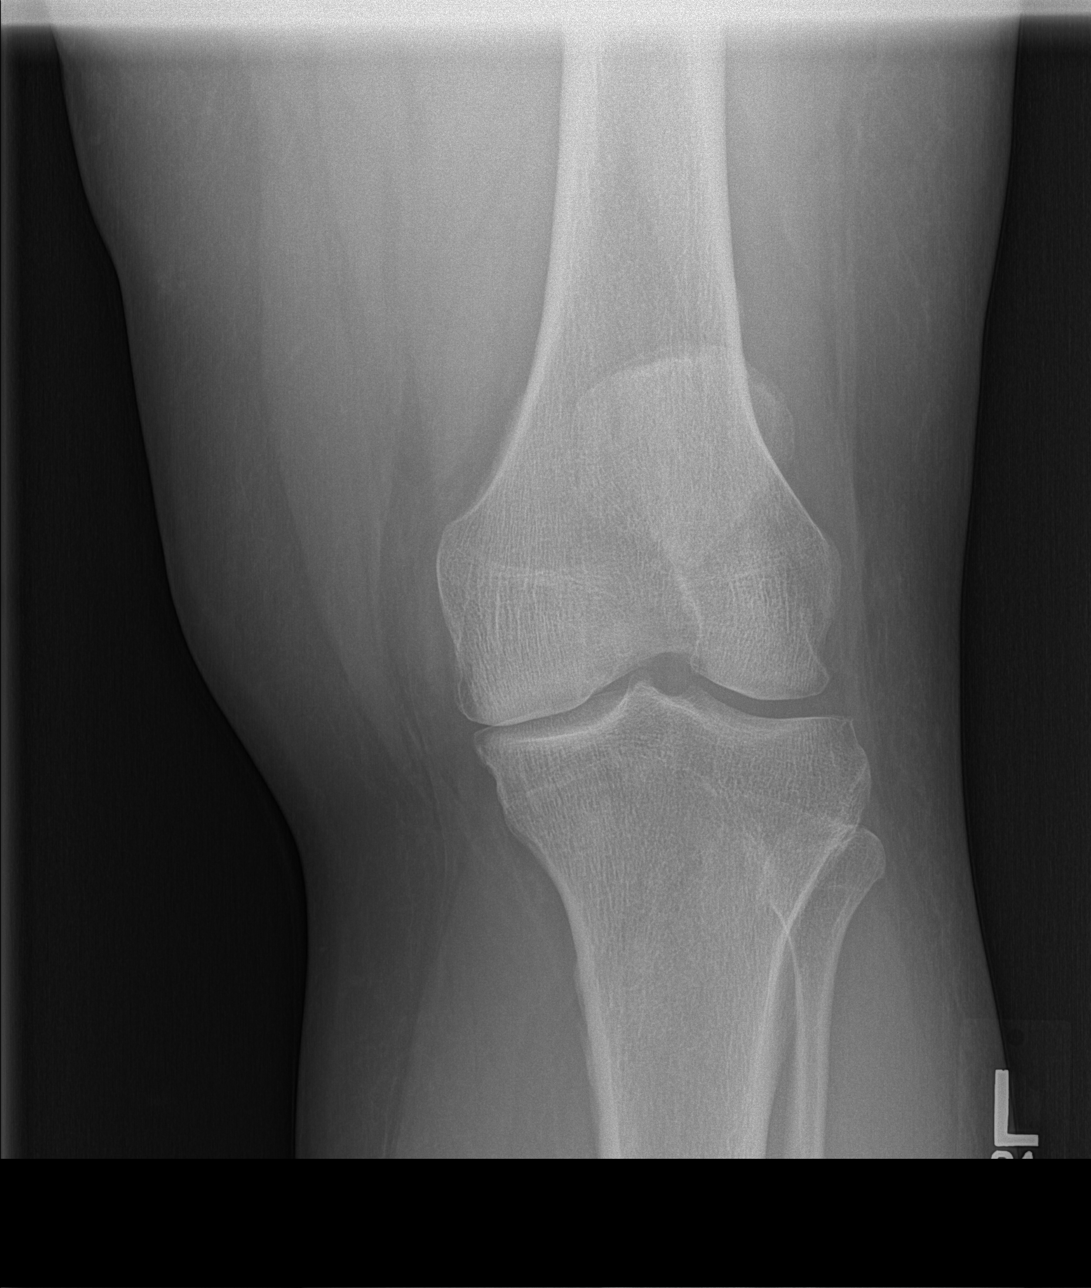

[w knee obl left (1 of 2)]
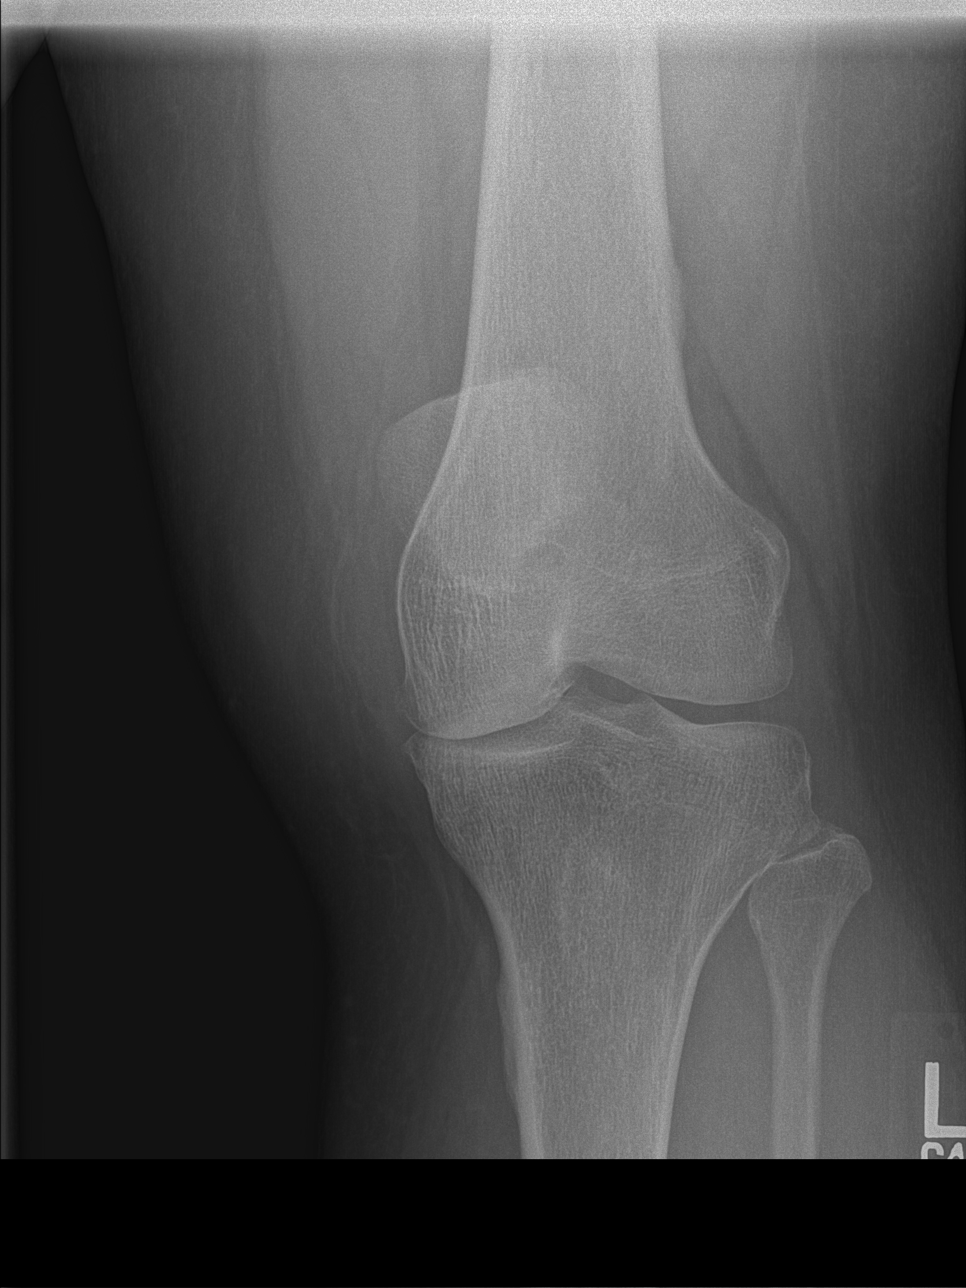

[w knee obl left (2 of 2)]
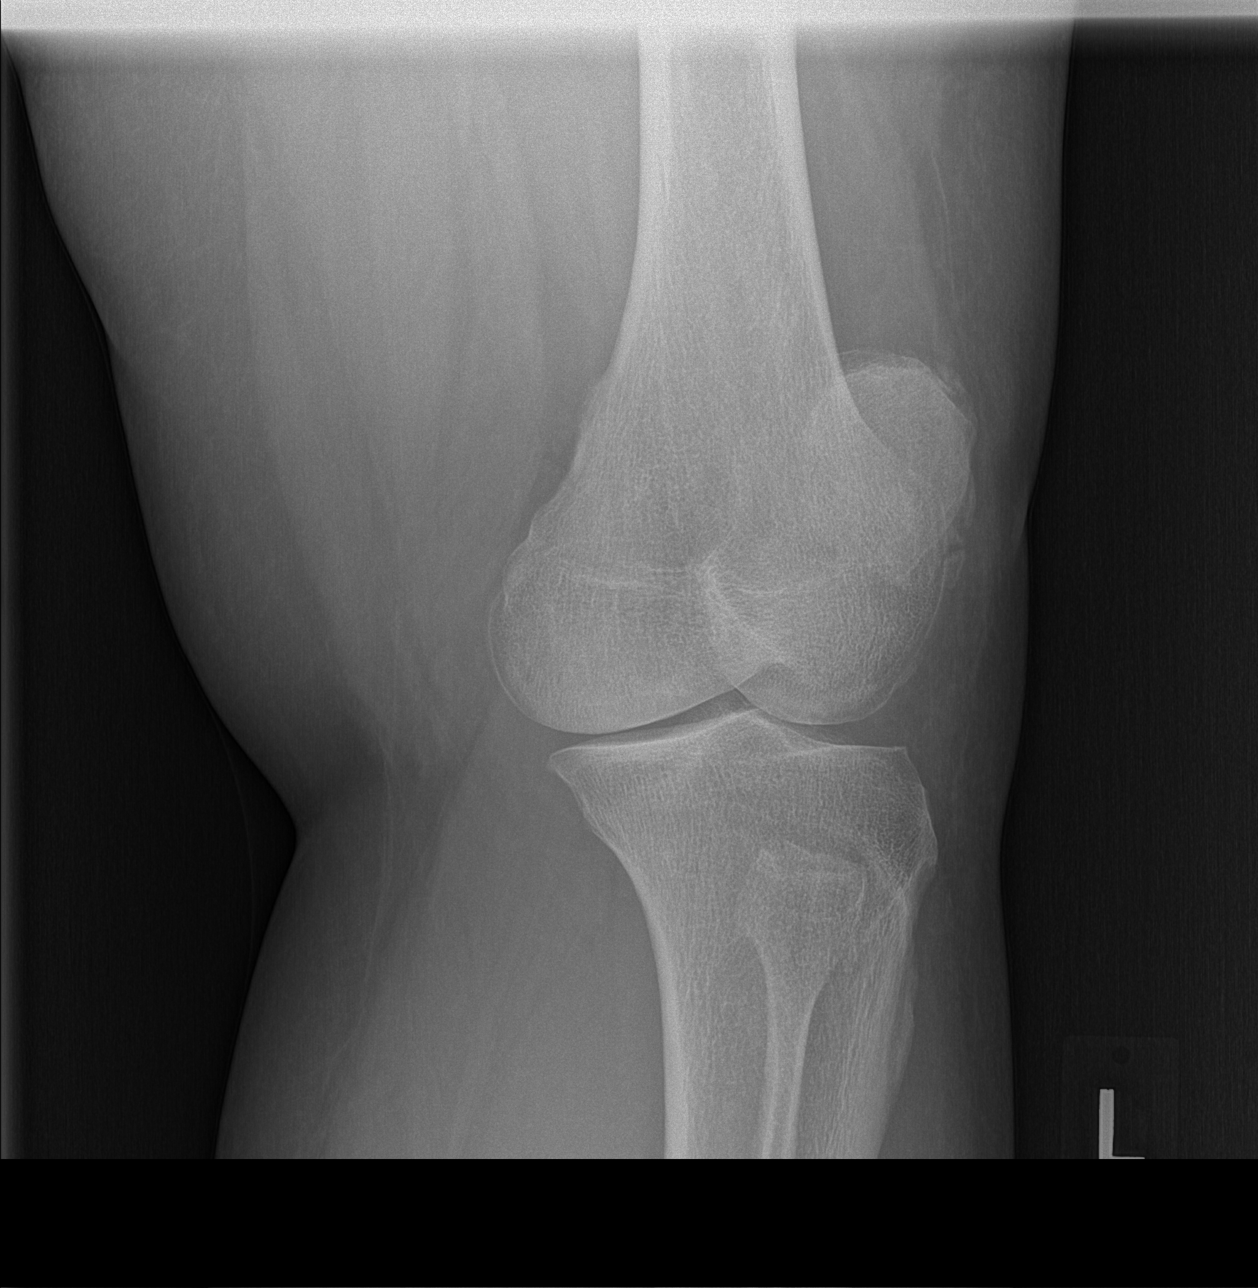

[w knee lat left]
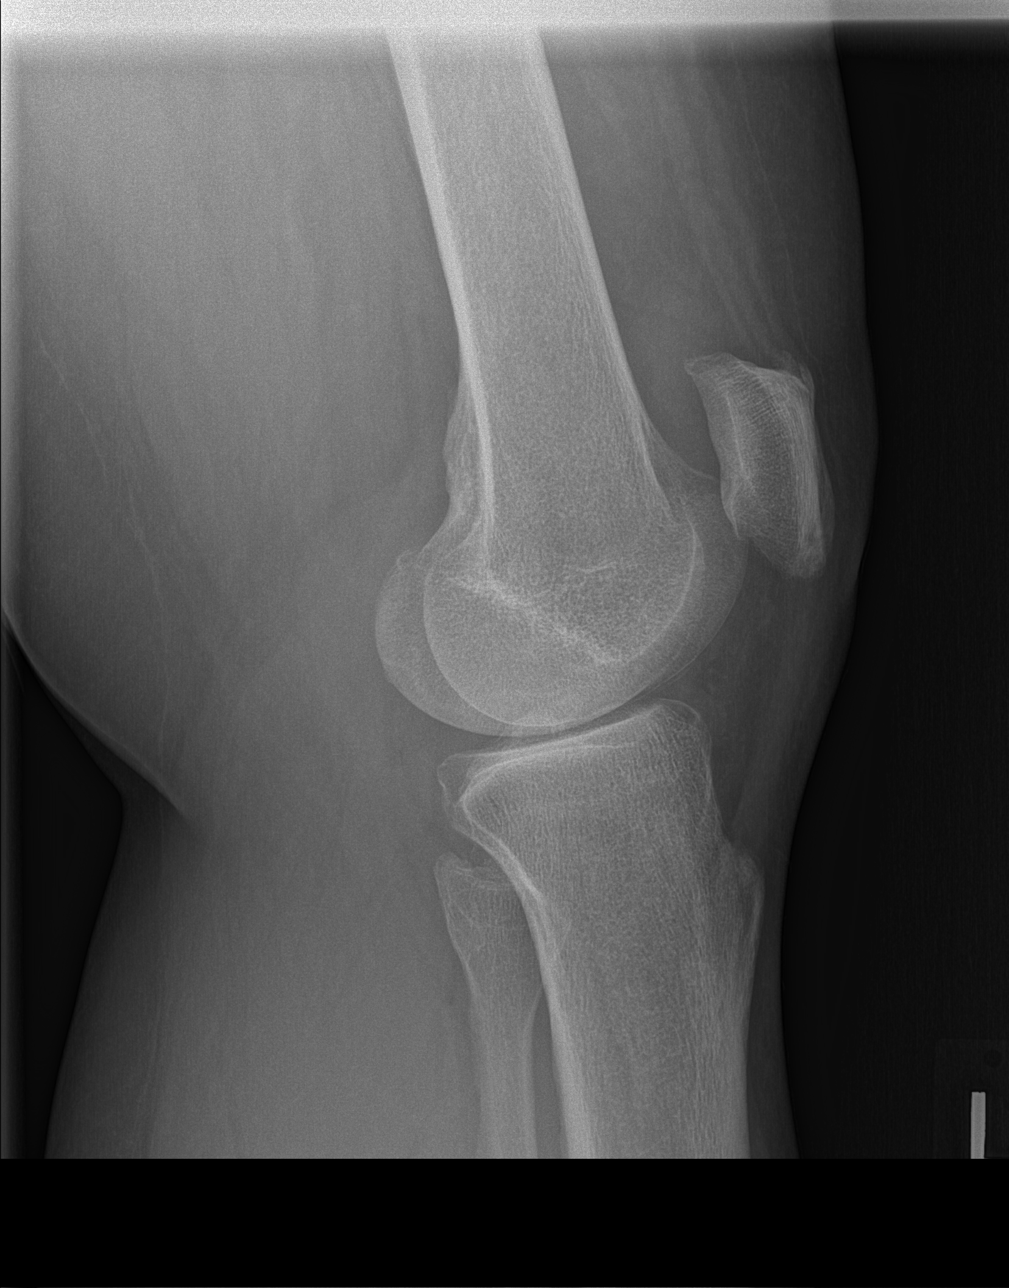

[4 of 4 positions shown; findings below may reference images not displayed]

FINDINGS: No joint effusion. There is medial compartment narrowing identified.
Mild tricompartment marginal spur formation identified. No fracture
or dislocation.
IMPRESSION: 1. Mild tricompartment osteoarthritis.
2. No acute findings.

## 2018-11-26 NOTE — ED Triage Notes (Signed)
Pt in c/o left knee pain that started about a month ago, denies specific injury, pain has gotten progressively worse

## 2018-11-26 NOTE — ED Notes (Signed)
Patient verbalizes understanding of discharge instructions. Opportunity for questioning and answers were provided. Armband removed by staff, pt discharged from ED ambulatory with ace wrap on knee.

## 2018-11-26 NOTE — Discharge Instructions (Addendum)
Please read attached information. If you experience any new or worsening signs or symptoms please return to the emergency room for evaluation. Please follow-up with your primary care provider or specialist as discussed.  °

## 2018-11-26 NOTE — ED Provider Notes (Signed)
[facility] EMERGENCY DEPARTMENT Provider Note   CSN: [id number] Arrival date & time: 11/26/18  0932     History   Chief Complaint Chief Complaint  Patient presents with  . Knee Pain    HPI Michele Garcia is a 53 y.o. female.  HPI   53 year old female presents today with complaints of swelling to her left lower extremity.  Patient notes greater than 1 month history of knee pain.  She notes this is worse after walking on concrete floors at work.  She has noticed about the same time she has had swelling to the left calf with tightness.  She notes this feels similar to previous history of superficial DVT on the right.  She denies any chest pain or shortness of breath.  She denies any estrogen use, malignancy, recent surgeries or prolonged immobilization, she is a non-smoker.  Past Medical History:  Diagnosis Date  . Hyperlipidemia   . Hypertension   . Narcolepsy     Patient Active Problem List   Diagnosis Date Noted  . 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 Other chest pain 06/04/2015  . Morbid obesity (HCC) 06/04/2015  . Breast pain, right 11/02/2012    Past Surgical History:  Procedure Laterality Date  . ABDOMINAL HYSTERECTOMY    . CESAREAN SECTION     x 2  . dermoid tumor       OB History    Gravida  4   Para  2   Term  2   Preterm      AB  2   Living  2     SAB      TAB      Ectopic      Multiple      Live Births               Home Medications    Prior to Admission medications   Medication Sig Start Date End Date Taking? Authorizing Provider  aspirin EC 81 MG tablet Take 81 mg by mouth daily as needed for mild pain.     [provider]  hydrochlorothiazide (HYDRODIURIL) 12.5 MG tablet Take 1 tablet (12.5 mg total) by mouth daily. 07/23/17   Azalia Bilis, MD  lisinopril (PRINIVIL,ZESTRIL) 10 MG tablet Take 1 tablet (10 mg total) by mouth daily. 07/23/17   Azalia Bilis, MD  naproxen sodium (ANAPROX) 220 MG tablet  Take 440 mg by mouth daily as needed (for pain).    [provider]  Psyllium (METAMUCIL FIBER PO) Take 1 tablet by mouth daily as needed (for bowl movement).    [provider]    Family History Family History  Problem Relation Age of Onset  . Cancer Mother 13       breast  . Breast cancer Mother   . Cancer Maternal Grandmother        breast  . Diabetes Maternal Grandmother   . Breast cancer Maternal Grandmother   . Diabetes Maternal Grandfather   . Stroke Maternal Grandfather   . Hypertension Maternal Grandfather     Social History Social History   Tobacco Use  . Smoking status: Never Smoker  . Smokeless tobacco: Never Used  Substance Use Topics  . Alcohol use: No  . Drug use: No     Allergies   Patient has no known allergies.   Review of Systems Review of Systems  All other systems reviewed and are negative.   Physical Exam Updated Vital Signs BP (!) 156/106 (BP  Location: Left Arm)   Pulse 83   Temp 98.2 F (36.8 C) (Oral)   Resp 16   LMP 12/18/2011   SpO2 97%   Physical Exam Vitals signs and nursing note reviewed.  Constitutional:      Appearance: She is well-developed.  HENT:     Head: Normocephalic and atraumatic.  Eyes:     General: No scleral icterus.       Right eye: No discharge.        Left eye: No discharge.     Conjunctiva/sclera: Conjunctivae normal.     Pupils: Pupils are equal, round, and reactive to light.  Neck:     Musculoskeletal: Normal range of motion.     Vascular: No JVD.     Trachea: No tracheal deviation.  Pulmonary:     Effort: Pulmonary effort is normal.     Breath sounds: No stridor.  Musculoskeletal:     Comments: Left lower extremity with minor edema, knee nontender pain with flexion greater than 90 degrees of flexion noted, no significant laxity, no redness or warmth-ankle nontender full active range of motion no pain at the calf with dorsiflexion  Neurological:     Mental Status: She is alert  and oriented to person, place, and time.     Coordination: Coordination normal.  Psychiatric:        Behavior: Behavior normal.        Thought Content: Thought content normal.        Judgment: Judgment normal.      ED Treatments / Results  Labs (all labs ordered are listed, but only abnormal results are displayed) Labs Reviewed - No data to display  EKG None  Radiology Dg Knee Complete 4 Views Left  Result Date: 11/26/2018 CLINICAL DATA:  Pain around patellar region.  No known injury. EXAM: LEFT KNEE - COMPLETE 4+ VIEW COMPARISON:  None. FINDINGS: No joint effusion. There is medial compartment narrowing identified. Mild tricompartment marginal spur formation identified. No fracture or dislocation. IMPRESSION: 1. Mild tricompartment osteoarthritis. 2. No acute findings. Electronically Signed   By: Signa Kell M.D.   On: 11/26/2018 10:48   Vas [place] Lower Extremity Venous (dvt) (only [facility])  Result Date: 11/26/2018  Lower Venous Study Indications: Knee pain.  Comparison Study: prior negative study from 12/18/11 is available for comparison Performing Technologist: Sherren Kerns RVS  Examination Guidelines: A complete evaluation includes B-mode imaging, spectral Doppler, color Doppler, and power Doppler as needed of all accessible portions of each vessel. Bilateral testing is considered an integral part of a complete examination. Limited examinations for reoccurring indications may be performed as noted.  Right Venous Findings: +---+---------------+---------+-----------+----------+-------+    CompressibilityPhasicitySpontaneityPropertiesSummary +---+---------------+---------+-----------+----------+-------+ CFVFull           Yes      Yes                          +---+---------------+---------+-----------+----------+-------+  Left Venous Findings: +---------+---------------+---------+-----------+----------+-------+          CompressibilityPhasicitySpontaneityPropertiesSummary  +---------+---------------+---------+-----------+----------+-------+ CFV      Full           Yes      Yes                          +---------+---------------+---------+-----------+----------+-------+ SFJ      Full                                                 +---------+---------------+---------+-----------+----------+-------+  FV Prox  Full                                                 +---------+---------------+---------+-----------+----------+-------+ FV Mid   Full                                                 +---------+---------------+---------+-----------+----------+-------+ FV DistalFull                                                 +---------+---------------+---------+-----------+----------+-------+ PFV      Full                                                 +---------+---------------+---------+-----------+----------+-------+ POP      Full           Yes      Yes                          +---------+---------------+---------+-----------+----------+-------+ PTV      Full                                                 +---------+---------------+---------+-----------+----------+-------+ PERO     Full                                                 +---------+---------------+---------+-----------+----------+-------+ GSV      Full                                                 +---------+---------------+---------+-----------+----------+-------+    Summary: Right: No evidence of common femoral vein obstruction. Left: There is no evidence of deep vein thrombosis in the lower extremity.There is no evidence of superficial venous thrombosis. A cystic structure is found in the popliteal fossa.  *See table(s) above for measurements and observations.    Preliminary     Procedures Procedures (including critical care time)  Medications Ordered in ED Medications - No data to display   Initial Impression / Assessment and Plan / ED Course   I have reviewed the triage vital signs and the nursing notes.  Pertinent labs & imaging results that were available during my care of the patient were reviewed by me and considered in my medical decision making (see chart for details).     Labs:   Imaging:  Consults:  Therapeutics:  Discharge Meds:   Assessment/Plan: 53 year old female presents today with left-sided knee pain.  Patient likely having arthritic pain.  She has some minor edema in the left  leg and no signs of DVT.  Patient will follow-up as an outpatient with orthopedic specialist return immediately she develops any new or worsening signs or symptoms.  She verbalized understanding and agreement to this plan had no further questions or concerns   Final Clinical Impressions(s) / ED Diagnoses   Final diagnoses:  Acute pain of left knee  Leg swelling    ED Discharge Orders    None       Eyvonne MechanicHedges, Felicitas Sine, PA-C 11/26/18 1300    Margarita Grizzleay, Danielle, MD 11/30/18 1537

## 2018-11-26 NOTE — Progress Notes (Addendum)
VASCULAR LAB PRELIMINARY  PRELIMINARY  PRELIMINARY  PRELIMINARY  Left lower extremity venous duplex completed.    Preliminary report:  See CV Proc  Attempted 2X to call results to Eyvonne Mechanic, PA-C  No answer  Sherren Kerns, RVT 11/26/2018, 12:25 PM

## 2018-11-26 NOTE — ED Notes (Signed)
Patient transported to Ultrasound 

## 2019-11-25 ENCOUNTER — Other Ambulatory Visit: Payer: Self-pay

## 2019-11-25 ENCOUNTER — Emergency Department (HOSPITAL_COMMUNITY): Payer: Self-pay

## 2019-11-25 ENCOUNTER — Emergency Department (HOSPITAL_COMMUNITY)
Admission: EM | Admit: 2019-11-25 | Discharge: 2019-11-26 | Disposition: A | Discharge status: Home / Self Care | Payer: Self-pay | Attending: Emergency Medicine | Admitting: Emergency Medicine

## 2019-11-25 ENCOUNTER — Encounter (HOSPITAL_COMMUNITY): Payer: Self-pay

## 2019-11-25 DIAGNOSIS — J1289 Other viral pneumonia: Secondary | ICD-10-CM | POA: Insufficient documentation

## 2019-11-25 DIAGNOSIS — U071 COVID-19: Secondary | ICD-10-CM | POA: Insufficient documentation

## 2019-11-25 DIAGNOSIS — Z7982 Long term (current) use of aspirin: Secondary | ICD-10-CM | POA: Insufficient documentation

## 2019-11-25 DIAGNOSIS — Z79899 Other long term (current) drug therapy: Secondary | ICD-10-CM | POA: Insufficient documentation

## 2019-11-25 DIAGNOSIS — I10 Essential (primary) hypertension: Secondary | ICD-10-CM | POA: Insufficient documentation

## 2019-11-25 DIAGNOSIS — R1031 Right lower quadrant pain: Secondary | ICD-10-CM | POA: Insufficient documentation

## 2019-11-25 DIAGNOSIS — J1282 Pneumonia due to coronavirus disease 2019: Secondary | ICD-10-CM

## 2019-11-25 LAB — COMPREHENSIVE METABOLIC PANEL
ALT: 24 U/L (ref 0–44)
AST: 24 U/L (ref 15–41)
Albumin: 3.6 g/dL (ref 3.5–5.0)
Alkaline Phosphatase: 93 U/L (ref 38–126)
Anion gap: 11 (ref 5–15)
BUN: 10 mg/dL (ref 6–20)
CO2: 24 mmol/L (ref 22–32)
Calcium: 8.8 mg/dL — ABNORMAL LOW (ref 8.9–10.3)
Chloride: 102 mmol/L (ref 98–111)
Creatinine, Ser: 0.99 mg/dL (ref 0.44–1.00)
GFR calc Af Amer: >60 mL/min (ref >60)
GFR calc non Af Amer: >60 mL/min (ref >60)
Glucose, Bld: 139 mg/dL — ABNORMAL HIGH (ref 70–99)
Potassium: 4.2 mmol/L (ref 3.5–5.1)
Sodium: 137 mmol/L (ref 135–145)
Total Bilirubin: 0.7 mg/dL (ref 0.3–1.2)
Total Protein: 6.9 g/dL (ref 6.5–8.1)

## 2019-11-25 LAB — URINALYSIS, ROUTINE W REFLEX MICROSCOPIC
Bilirubin Urine: NEGATIVE
Glucose, UA: NEGATIVE mg/dL
Hgb urine dipstick: NEGATIVE
Ketones, ur: NEGATIVE mg/dL
Leukocytes,Ua: NEGATIVE
Nitrite: NEGATIVE
Protein, ur: 100 mg/dL — AB
Specific Gravity, Urine: 1.01 (ref 1.005–1.030)
pH: 7 (ref 5.0–8.0)

## 2019-11-25 LAB — CBC
HCT: 41.9 % (ref 36.0–46.0)
Hemoglobin: 13.1 g/dL (ref 12.0–15.0)
MCH: 27.8 pg (ref 26.0–34.0)
MCHC: 31.3 g/dL (ref 30.0–36.0)
MCV: 88.8 fL (ref 80.0–100.0)
Platelets: 201 10*3/uL (ref 150–400)
RBC: 4.72 MIL/uL (ref 3.87–5.11)
RDW: 12.6 % (ref 11.5–15.5)
WBC: 5 10*3/uL (ref 4.0–10.5)
nRBC: 0 % (ref 0.0–0.2)

## 2019-11-25 LAB — POC SARS CORONAVIRUS 2 AG -  ED: SARS Coronavirus 2 Ag: POSITIVE — AB

## 2019-11-25 LAB — LIPASE, BLOOD: Lipase: 24 U/L (ref 11–51)

## 2019-11-25 IMAGING — DX DG CHEST 1V PORT
1 series · 1 of 1 positions shown · non-contrast
Comparison: [DATE]

CLINICAL DATA: Fever and chills.

EXAM:
PORTABLE CHEST 1 VIEW

[chest ap]
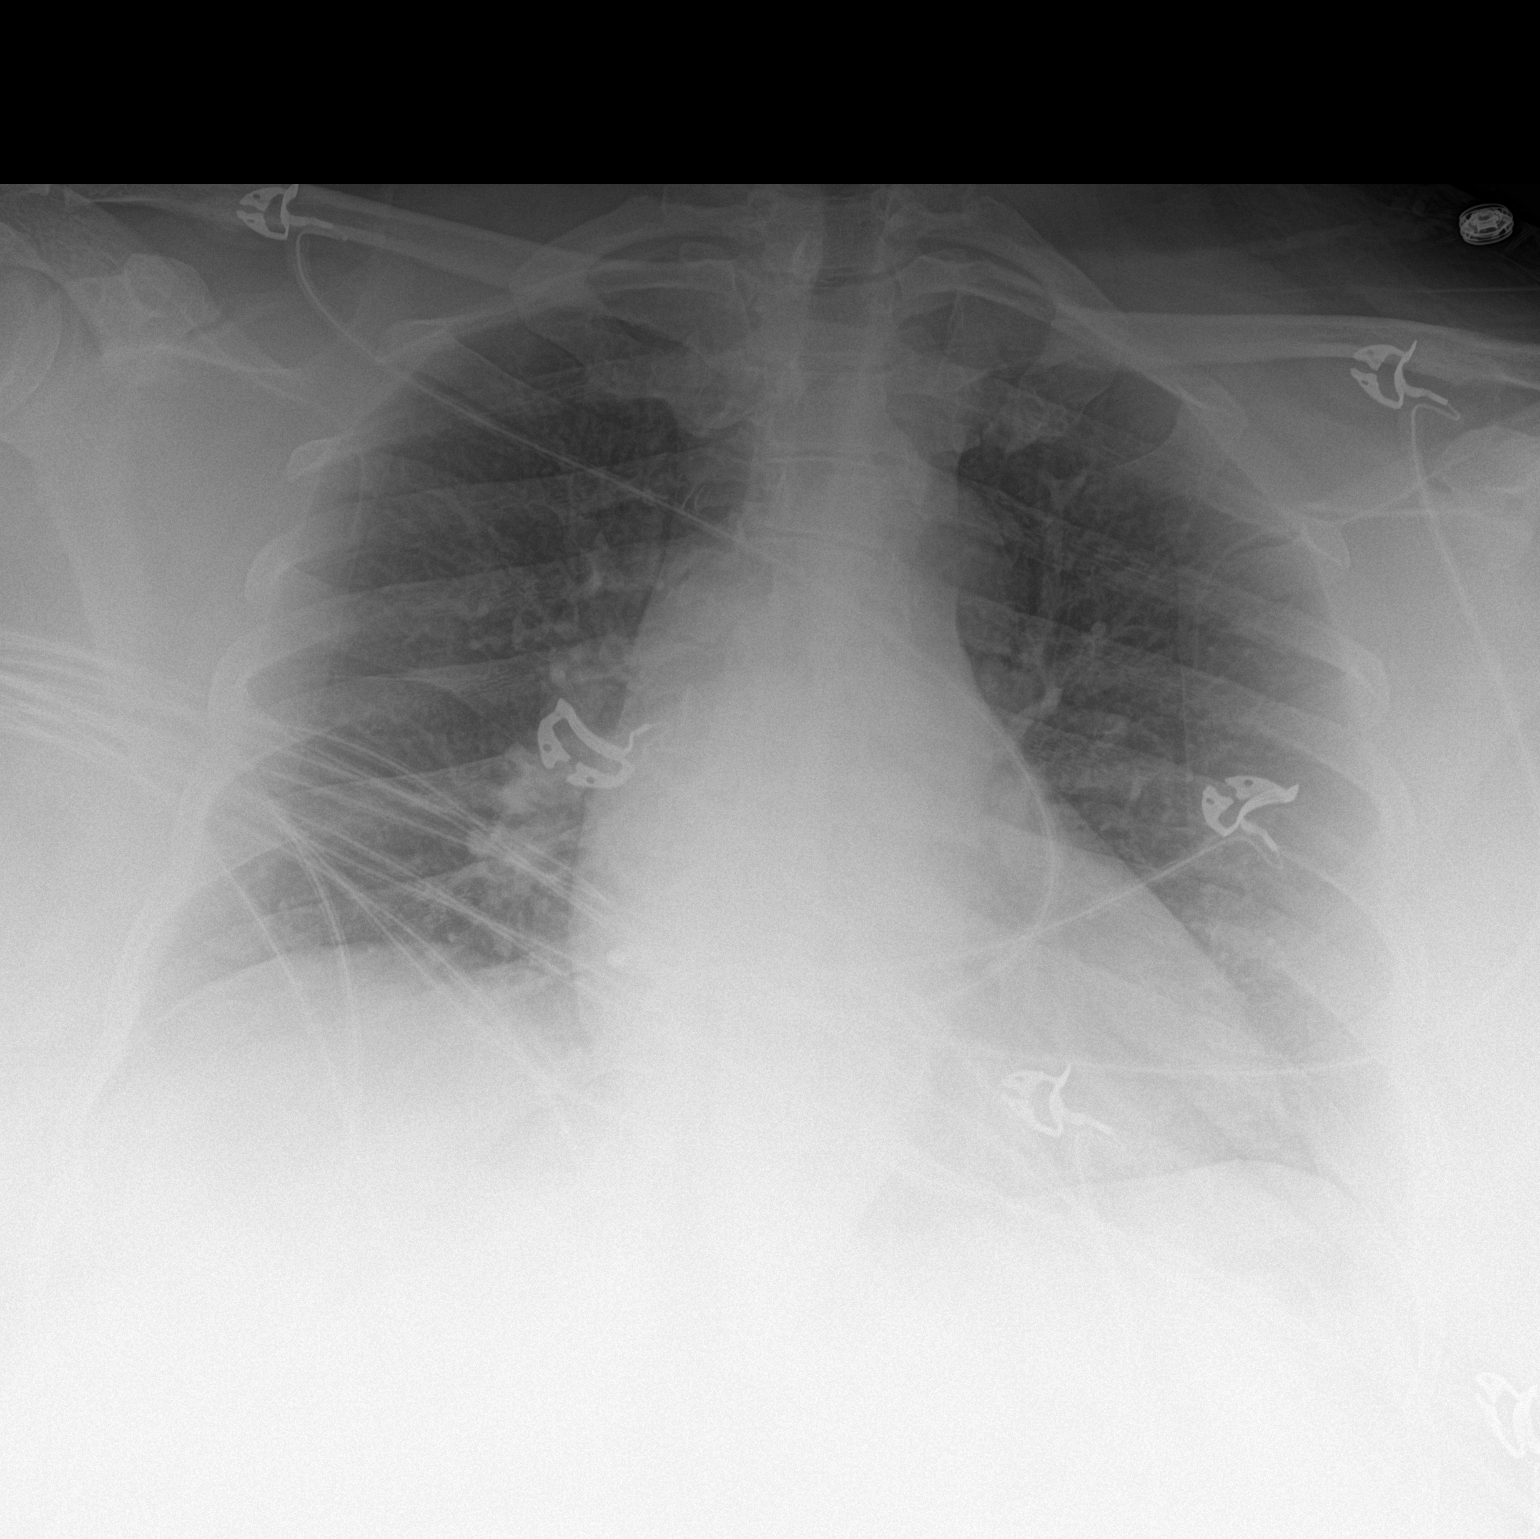

[1 of 1 positions shown; findings below may reference images not displayed]

FINDINGS: The heart size and mediastinal contours are within normal limits.
Both lungs are clear. The visualized skeletal structures are
unremarkable.
IMPRESSION: No active disease.

## 2019-11-25 IMAGING — CT CT ABD-PELV W/ CM
2 of 5 series · 17 of 46 positions shown, 19 images · IV contrast (APPLIED)
Comparison: None.

CLINICAL DATA: Fever and right-sided pain for 1 day, history of
[OQ] positivity

EXAM:
CT ABDOMEN AND PELVIS WITH CONTRAST
TECHNIQUE: Multidetector CT imaging of the abdomen and pelvis was performed
using the standard protocol following bolus administration of
intravenous contrast.
CONTRAST:  100mL OMNIPAQUE IOHEXOL 300 MG/ML  SOLN

[Series 3: abd/ pelvis 5.0 i30f 5 · axial · 0.98mm/px · z∈[+818,+1288]mm · 14 of 106 slices shown, 16 images]
[im 6/106  soft-tissue]
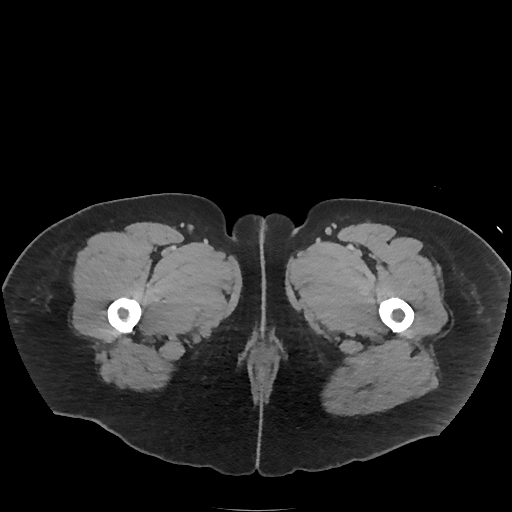
[im 6/106  bone]
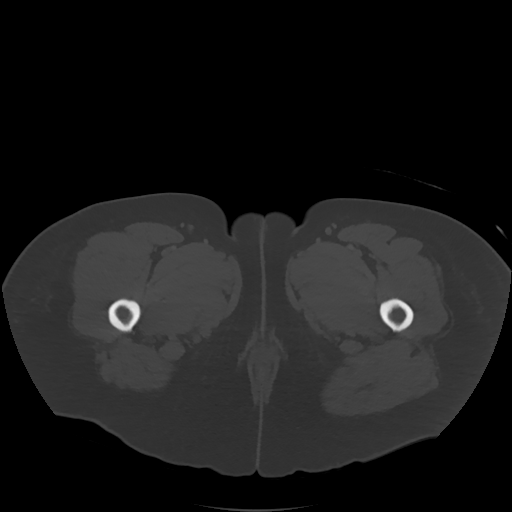
[im 12/106  soft-tissue]
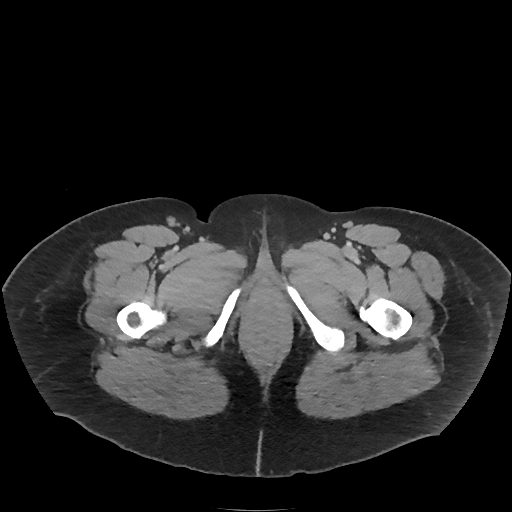
[im 23/106  soft-tissue]
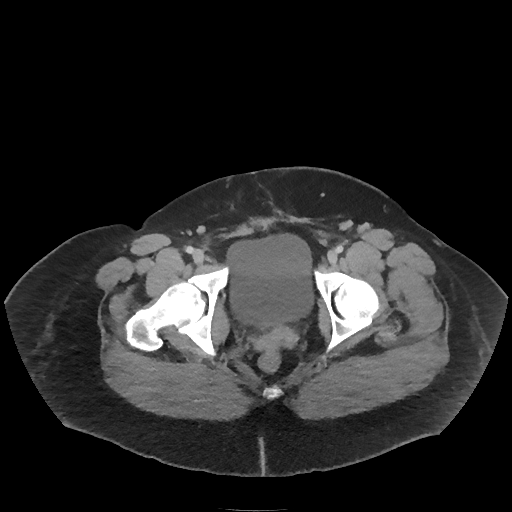
[im 28/106  soft-tissue]
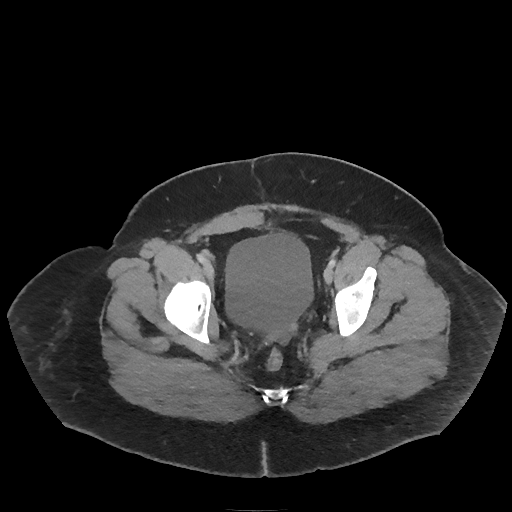
[im 34/106  soft-tissue]
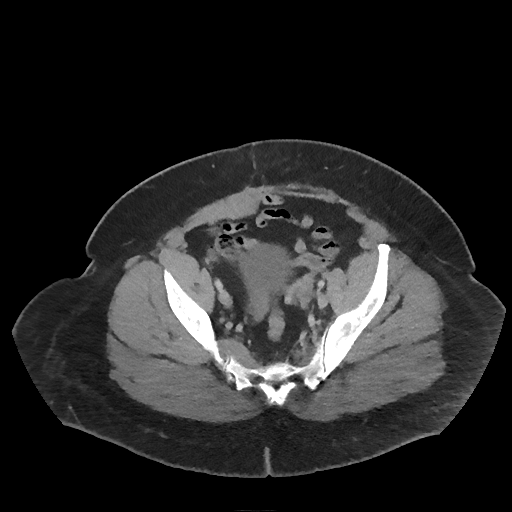
[im 45/106  soft-tissue]
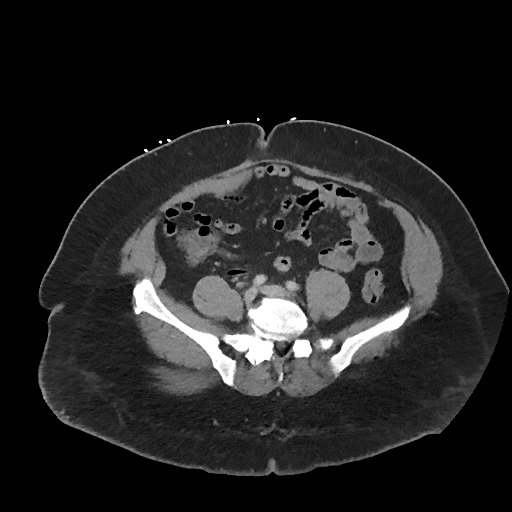
[im 50/106  soft-tissue]
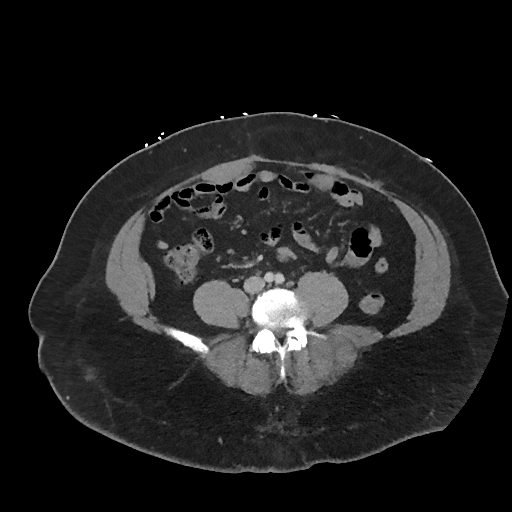
[im 56/106  soft-tissue]
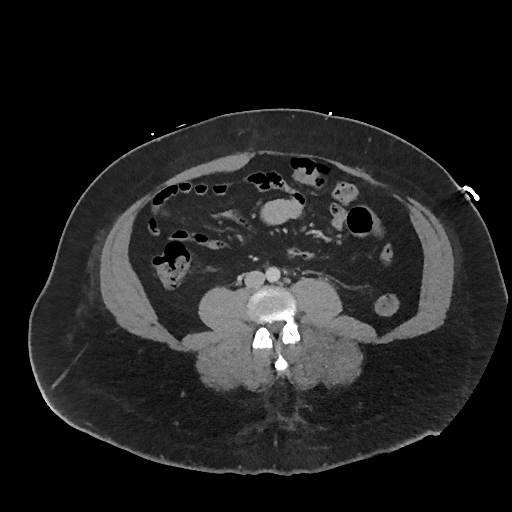
[im 61/106  soft-tissue]
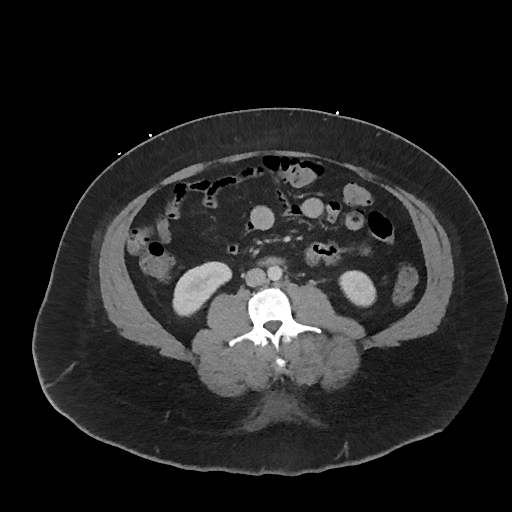
[im 61/106  bone]
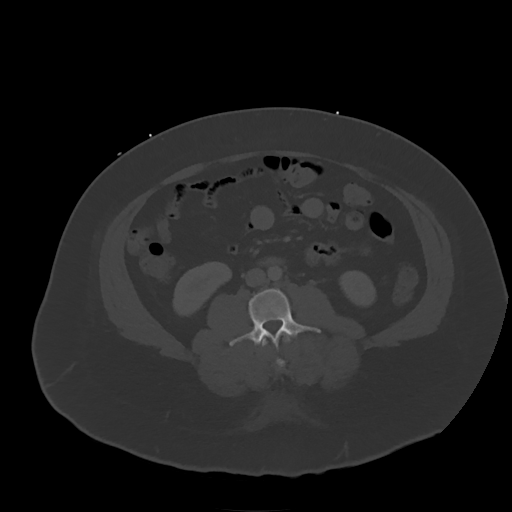
[im 72/106  soft-tissue]
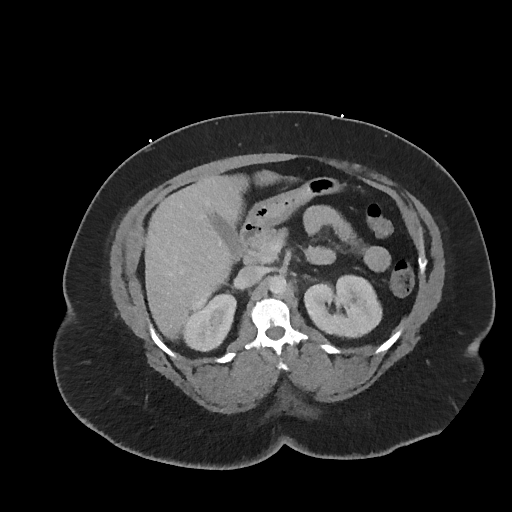
[im 78/106  soft-tissue]
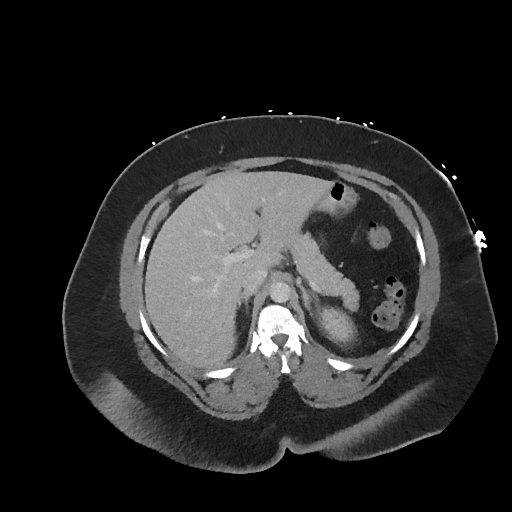
[im 83/106  soft-tissue]
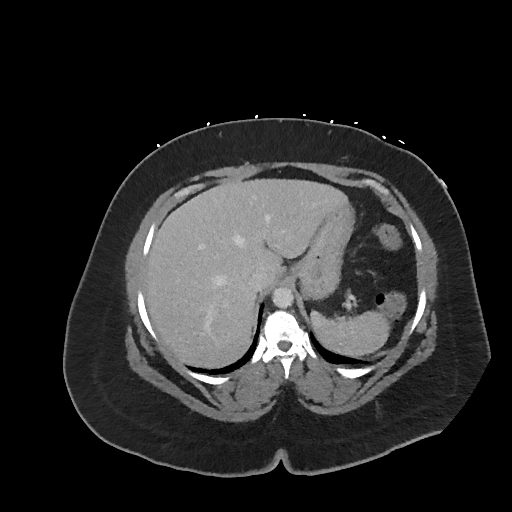
[im 94/106  soft-tissue]
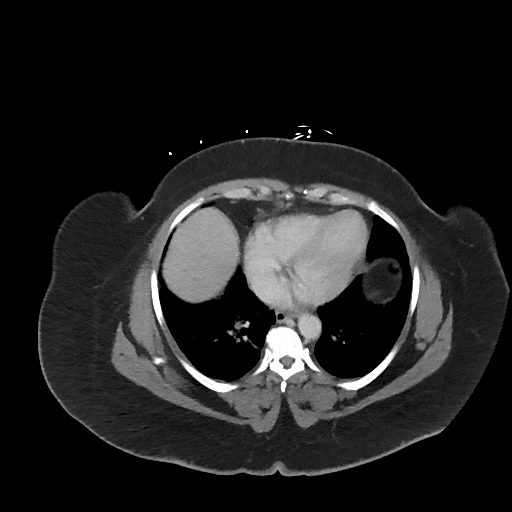
[im 100/106  soft-tissue]
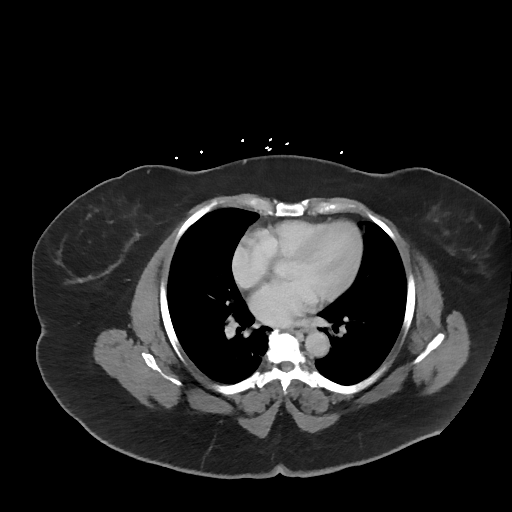

[Series 6: coronal soft tissue · coronal · 1.03mm/px · 3 of 141 slices shown]
[im 47/141  soft-tissue]
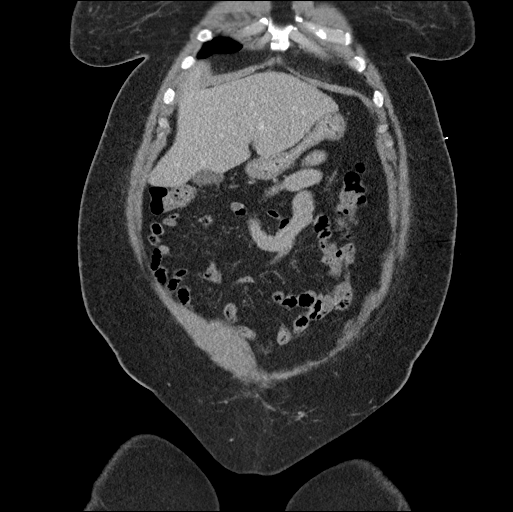
[im 63/141  soft-tissue]
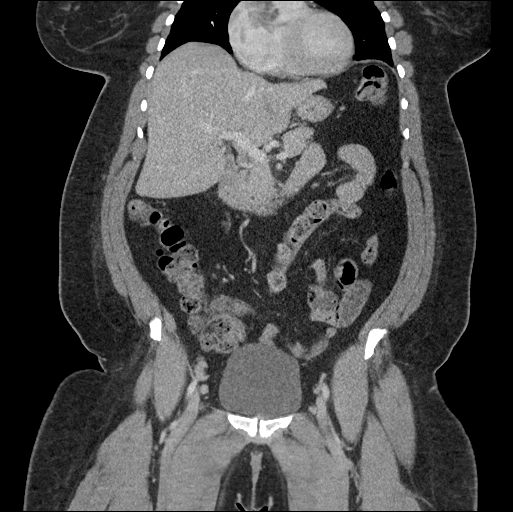
[im 78/141  soft-tissue]
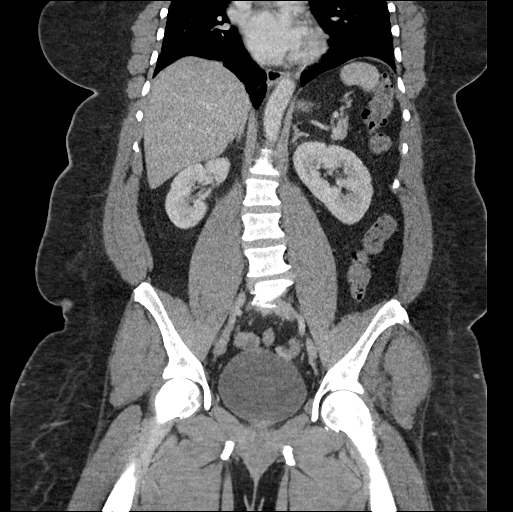

[17 of 46 positions shown; findings below may reference images not displayed]

FINDINGS: Lower chest: Lung bases demonstrate patchy ground-glass opacities
consistent with the given clinical history of [OQ] positivity.

Hepatobiliary: No focal liver abnormality is seen. No gallstones,
gallbladder wall thickening, or biliary dilatation.

Pancreas: Unremarkable. No pancreatic ductal dilatation or
surrounding inflammatory changes.

Spleen: Normal in size without focal abnormality.

Adrenals/Urinary Tract: Adrenal glands are within normal limits.
Kidneys are well visualized bilaterally within normal enhancement
pattern. No renal calculi or urinary tract obstructive changes are
seen. Normal excretion of contrast is noted. The bladder is well
distended.

Stomach/Bowel: No obstructive or inflammatory changes of the colon
are seen. The appendix is within normal limits. No inflammatory
changes are seen. Small bowel and stomach show no focal abnormality.

Vascular/Lymphatic: No significant vascular findings are present. No
enlarged abdominal or pelvic lymph nodes.

Reproductive: Status post hysterectomy. No adnexal masses.

Other: No abdominal wall hernia or abnormality. No abdominopelvic
ascites.

Musculoskeletal: Degenerative changes of the lumbar spine.
IMPRESSION: Patchy ground-glass opacities consistent with the given clinical
history of [OQ] positivity.

No acute abnormality in the abdomen or pelvis is seen.

## 2019-11-25 MED ORDER — IOHEXOL 300 MG/ML  SOLN
100.0000 mL | Freq: Once | INTRAMUSCULAR | Status: AC | PRN
  Administered 2019-11-25: 23:00:00 100 mL via INTRAVENOUS

## 2019-11-25 MED ORDER — SODIUM CHLORIDE 0.9 % IV SOLN: INTRAVENOUS | Status: DC

## 2019-11-25 NOTE — ED Triage Notes (Signed)
Pt reports fever, chills, body aches and right sided flank pain since Monday. Nausea but no vomiting. Denies sob/cough.

## 2019-11-25 NOTE — ED Notes (Signed)
POC CoV-2 Test "POSITIVE" informed Dr. Deretha Emory

## 2019-11-26 ENCOUNTER — Telehealth: Payer: Self-pay | Admitting: Nurse Practitioner

## 2019-11-26 MED ORDER — ZINC GLUCONATE 50 MG PO TABS: 50.0000 mg | ORAL_TABLET | Freq: Every day | ORAL | 0 refills | Status: AC

## 2019-11-26 NOTE — Telephone Encounter (Signed)
Called to discuss with Hermina Staggers about Covid symptoms and the use of bamlanivimab, a monoclonal antibody infusion for those with mild to moderate Covid symptoms and at a high risk of hospitalization.     Pt is qualified for this infusion at the [facility] infusion center based on chart review due to co-morbid conditions and/or a member of an at-risk group. Further discussions would be necessary to confirm eligibility.   Unable to reach patient. HIPPA appropriate voicemail left with contact information.      Patient Active Problem List   Diagnosis Date Noted  . 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 Other chest pain 06/04/2015  . Morbid obesity (HCC) 06/04/2015  . Breast pain, right 11/02/2012    Willette Alma, AGPCNP-BC Pager: [phone] Amion: Thea Alken

## 2019-11-26 NOTE — Discharge Instructions (Signed)
Patient with symptoms not consistent with COVID-19 infection.  Patient tested positive here.  CT of the abdomen picked up some multifocal opacities in the lungs consistent with COVID-19 pneumonia.  But patient without any hypoxia at all.  No shortness of breath and no increased respiratory rate.  Patient made aware of all this patient to self isolate at home.  Patient to return for any new or worse symptoms.  Recommending a multivitamin.  And also prescription provided for zinc supplement.  She may also may be able to get this over-the-counter cheaper.

## 2019-11-26 NOTE — ED Provider Notes (Signed)
[facility] EMERGENCY DEPARTMENT Provider Note   CSN: [id number] Arrival date & time: 11/25/19  1511     History Chief Complaint  Patient presents with  . Emesis  . Fever  . Flank Pain    Michele Garcia is a 54 y.o. female.  Patient with 7-day complaint of fever chills body aches and right-sided abdominal pain more right lower quadrant.  Nausea but no vomiting does have a little bit of a cough but no shortness of breath.  Lives with her husband who is asymptomatic.        Past Medical History:  Diagnosis Date  . Hyperlipidemia   . Hypertension   . Narcolepsy     Patient Active Problem List   Diagnosis Date Noted  . 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 Other chest pain 06/04/2015  . Morbid obesity (HCC) 06/04/2015  . Breast pain, right 11/02/2012    Past Surgical History:  Procedure Laterality Date  . ABDOMINAL HYSTERECTOMY    . CESAREAN SECTION     x 2  . dermoid tumor       OB History    Gravida  4   Para  2   Term  2   Preterm      AB  2   Living  2     SAB      TAB      Ectopic      Multiple      Live Births              Family History  Problem Relation Age of Onset  . Cancer Mother 24       breast  . Breast cancer Mother   . Cancer Maternal Grandmother        breast  . Diabetes Maternal Grandmother   . Breast cancer Maternal Grandmother   . Diabetes Maternal Grandfather   . Stroke Maternal Grandfather   . Hypertension Maternal Grandfather     Social History   Tobacco Use  . Smoking status: Never Smoker  . Smokeless tobacco: Never Used  Substance Use Topics  . Alcohol use: No  . Drug use: No    Home Medications Prior to Admission medications   Medication Sig Start Date End Date Taking? Authorizing Provider  aspirin EC 81 MG tablet Take 81 mg by mouth daily as needed for mild pain.     [provider]  hydrochlorothiazide (HYDRODIURIL) 12.5 MG tablet Take 1 tablet (12.5 mg total) by mouth  daily. 07/23/17   Azalia Bilis, MD  lisinopril (PRINIVIL,ZESTRIL) 10 MG tablet Take 1 tablet (10 mg total) by mouth daily. 07/23/17   Azalia Bilis, MD  naproxen sodium (ANAPROX) 220 MG tablet Take 440 mg by mouth daily as needed (for pain).    [provider]  Psyllium (METAMUCIL FIBER PO) Take 1 tablet by mouth daily as needed (for bowl movement).    [provider]  zinc gluconate 50 MG tablet Take 1 tablet (50 mg total) by mouth daily for 14 days. 11/26/19 12/10/19  Vanetta Mulders, MD    Allergies    Patient has no known allergies.  Review of Systems   Review of Systems  Constitutional: Positive for fever. Negative for chills.  HENT: Negative for congestion, rhinorrhea and sore throat.   Eyes: Negative for visual disturbance.  Respiratory: Positive for cough. Negative for shortness of breath.   Cardiovascular: Negative for chest pain and leg swelling.  Gastrointestinal: Positive for abdominal pain and  nausea. Negative for diarrhea and vomiting.  Genitourinary: Negative for dysuria.  Musculoskeletal: Positive for myalgias. Negative for back pain and neck pain.  Skin: Negative for rash.  Neurological: Negative for dizziness, light-headedness and headaches.  Hematological: Does not bruise/bleed easily.  Psychiatric/Behavioral: Negative for confusion.    Physical Exam Updated Vital Signs BP 140/83   Pulse 98   Temp 98 F (36.7 C) (Oral)   Resp (!) 25   LMP 12/18/2011   SpO2 98%   Physical Exam Vitals and nursing note reviewed.  Constitutional:      General: She is not in acute distress.    Appearance: Normal appearance. She is well-developed.  HENT:     Head: Normocephalic and atraumatic.  Eyes:     Extraocular Movements: Extraocular movements intact.     Conjunctiva/sclera: Conjunctivae normal.     Pupils: Pupils are equal, round, and reactive to light.  Cardiovascular:     Rate and Rhythm: Normal rate and regular rhythm.     Heart sounds: No murmur.    Pulmonary:     Effort: Pulmonary effort is normal. No respiratory distress.     Breath sounds: Normal breath sounds.  Abdominal:     Palpations: Abdomen is soft.     Tenderness: There is no abdominal tenderness.  Musculoskeletal:        General: No swelling or tenderness. Normal range of motion.     Cervical back: Normal range of motion and neck supple.  Skin:    General: Skin is warm and dry.  Neurological:     General: No focal deficit present.     Mental Status: She is alert and oriented to person, place, and time.     Cranial Nerves: No cranial nerve deficit.     Sensory: No sensory deficit.     Motor: No weakness.     ED Results / Procedures / Treatments   Labs (all labs ordered are listed, but only abnormal results are displayed) Labs Reviewed  COMPREHENSIVE METABOLIC PANEL - Abnormal; Notable for the following components:      Result Value   Glucose, Bld 139 (*)    Calcium 8.8 (*)    All other components within normal limits  URINALYSIS, ROUTINE W REFLEX MICROSCOPIC - Abnormal; Notable for the following components:   APPearance HAZY (*)    Protein, ur 100 (*)    Bacteria, UA FEW (*)    All other components within normal limits  POC SARS CORONAVIRUS 2 AG -  ED - Abnormal; Notable for the following components:   SARS Coronavirus 2 Ag POSITIVE (*)    All other components within normal limits  LIPASE, BLOOD  CBC    EKG None  Radiology CT Abdomen Pelvis W Contrast  Result Date: 11/25/2019 CLINICAL DATA:  Fever and right-sided pain for 1 day, history of COVID-19 positivity EXAM: CT ABDOMEN AND PELVIS WITH CONTRAST TECHNIQUE: Multidetector CT imaging of the abdomen and pelvis was performed using the standard protocol following bolus administration of intravenous contrast. CONTRAST:  14mL OMNIPAQUE IOHEXOL 300 MG/ML  SOLN COMPARISON:  None. FINDINGS: Lower chest: Lung bases demonstrate patchy ground-glass opacities consistent with the given clinical history of COVID-19  positivity. Hepatobiliary: No focal liver abnormality is seen. No gallstones, gallbladder wall thickening, or biliary dilatation. Pancreas: Unremarkable. No pancreatic ductal dilatation or surrounding inflammatory changes. Spleen: Normal in size without focal abnormality. Adrenals/Urinary Tract: Adrenal glands are within normal limits. Kidneys are well visualized bilaterally within normal enhancement pattern. No renal  calculi or urinary tract obstructive changes are seen. Normal excretion of contrast is noted. The bladder is well distended. Stomach/Bowel: No obstructive or inflammatory changes of the colon are seen. The appendix is within normal limits. No inflammatory changes are seen. Small bowel and stomach show no focal abnormality. Vascular/Lymphatic: No significant vascular findings are present. No enlarged abdominal or pelvic lymph nodes. Reproductive: Status post hysterectomy. No adnexal masses. Other: No abdominal wall hernia or abnormality. No abdominopelvic ascites. Musculoskeletal: Degenerative changes of the lumbar spine. IMPRESSION: Patchy ground-glass opacities consistent with the given clinical history of COVID-19 positivity. No acute abnormality in the abdomen or pelvis is seen. Electronically Signed   By: Alcide Clever M.D.   On: 11/25/2019 23:33   DG Chest Port 1 View  Result Date: 11/25/2019 CLINICAL DATA:  Fever and chills. EXAM: PORTABLE CHEST 1 VIEW COMPARISON:  July 23, 2017 FINDINGS: The heart size and mediastinal contours are within normal limits. Both lungs are clear. The visualized skeletal structures are unremarkable. IMPRESSION: No active disease. Electronically Signed   By: Aram Candela M.D.   On: 11/25/2019 22:40    Procedures Procedures (including critical care time)  Medications Ordered in ED Medications  0.9 %  sodium chloride infusion ( Intravenous New Bag/Given 11/25/19 2137)  iohexol (OMNIPAQUE) 300 MG/ML solution 100 mL (100 mLs Intravenous Contrast Given  11/25/19 2321)    ED Course  I have reviewed the triage vital signs and the nursing notes.  Pertinent labs & imaging results that were available during my care of the patient were reviewed by me and considered in my medical decision making (see chart for details).    MDM Rules/Calculators/A&P                        CT scan of the abdomen without any acute findings.  With the cuts through the lower part of the lungs was consistent with patchy infiltrates of COVID-19.  Patient's point-of-care Covid test was positive.  Her other symptoms were very suggestive of it.  She has been symptomatic for about 7 days.  She said body aches little bit of cough fever chills and some nausea but no vomiting no diarrhea.  Patient in no acute distress no hypoxia no tachypnea.  Patient stable for discharge home with precautions.  Patient will be treated symptomatically.  Patient lives with her husband and she will self isolate.  Final Clinical Impression(s) / ED Diagnoses Final diagnoses:  COVID-19 virus infection  Pneumonia due to COVID-19 virus    Rx / DC Orders ED Discharge Orders         Ordered    zinc gluconate 50 MG tablet  Daily     11/26/19 0100           Vanetta Mulders, MD 11/26/19 0106

## 2019-11-26 NOTE — Telephone Encounter (Signed)
Called to Discuss with patient about Covid symptoms and the use of bamlanivimab, a monoclonal antibody infusion for those with mild to moderate Covid symptoms and at a high risk of hospitalization.     Pt will need to be evaluated to see if she meets criteria.    Unable to reach pt - left message

## 2019-11-27 ENCOUNTER — Emergency Department (HOSPITAL_COMMUNITY)
Admission: EM | Admit: 2019-11-27 | Discharge: 2019-11-27 | Disposition: A | Discharge status: Home / Self Care | Payer: Self-pay | Attending: Emergency Medicine | Admitting: Emergency Medicine

## 2019-11-27 ENCOUNTER — Encounter (HOSPITAL_COMMUNITY): Payer: Self-pay

## 2019-11-27 ENCOUNTER — Other Ambulatory Visit: Payer: Self-pay

## 2019-11-27 DIAGNOSIS — R5383 Other fatigue: Secondary | ICD-10-CM | POA: Insufficient documentation

## 2019-11-27 DIAGNOSIS — Z7982 Long term (current) use of aspirin: Secondary | ICD-10-CM | POA: Insufficient documentation

## 2019-11-27 DIAGNOSIS — U071 COVID-19: Secondary | ICD-10-CM | POA: Insufficient documentation

## 2019-11-27 DIAGNOSIS — Z79899 Other long term (current) drug therapy: Secondary | ICD-10-CM | POA: Insufficient documentation

## 2019-11-27 MED ORDER — ALBUTEROL SULFATE HFA 108 (90 BASE) MCG/ACT IN AERS
2.0000 | INHALATION_SPRAY | Freq: Once | RESPIRATORY_TRACT | Status: AC
  Administered 2019-11-27: 2 via RESPIRATORY_TRACT
  Filled 2019-11-27: qty 6.7

## 2019-11-27 MED ORDER — DEXAMETHASONE 4 MG PO TABS
6.0000 mg | ORAL_TABLET | Freq: Once | ORAL | Status: AC
  Administered 2019-11-27: 23:00:00 6 mg via ORAL
  Filled 2019-11-27: qty 2

## 2019-11-27 MED ORDER — DEXAMETHASONE 6 MG PO TABS: 6.0000 mg | ORAL_TABLET | Freq: Every day | ORAL | 0 refills | Status: DC

## 2019-11-27 NOTE — Discharge Instructions (Signed)
At this time you do not require hospitalization.  Continue to keep check of your pulse oximetry.  You do qualify for an outpatient infusion treatment.  Someone tried leaving you a voicemail yesterday concerning this.  Their contact information is below.  Use albuterol inhaler as needed and take decadron (steroid) until finished.  Return to the emergency room if you feel like your breathing is getting significantly worse.  Willette Alma, AGPCNP-BC Pager: [phone]

## 2019-11-27 NOTE — ED Triage Notes (Signed)
Pt covid +, was told to monitor pulse oximetry.  Pt reports today pulse oximetry fluctuating 90-94%.  Talking in complete sentences, no respiratory distress.  Denies shortness of breath.  Pt feels she needs to be on steroid medication. C/o pain on right upper abd pain.

## 2019-11-27 NOTE — ED Provider Notes (Signed)
[facility] Provider Note   CSN: [id number] Arrival date & time: 11/27/19  2140     History Chief Complaint  Patient presents with  . covid positive    [facility] is a 54 y.o. female.  HPI    53yF with recent COVID diagnosis. Has been feeling tired. Advised to monitor pulse ox. Noticed down to 90% on occasion today. Hasn't felt significantly SOB though. No fever. Maybe an occasional dry cough.   Past Medical History:  Diagnosis Date  . Hyperlipidemia   . Hypertension   . Narcolepsy     Patient Active Problem List   Diagnosis Date Noted  . 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 Other chest pain 06/04/2015  . Morbid obesity (HCC) 06/04/2015  . Breast pain, right 11/02/2012    Past Surgical History:  Procedure Laterality Date  . ABDOMINAL HYSTERECTOMY    . CESAREAN SECTION     x 2  . dermoid tumor       OB History    Gravida  4   Para  2   Term  2   Preterm      AB  2   Living  2     SAB      TAB      Ectopic      Multiple      Live Births              Family History  Problem Relation Age of Onset  . Cancer Mother 67       breast  . Breast cancer Mother   . Cancer Maternal Grandmother        breast  . Diabetes Maternal Grandmother   . Breast cancer Maternal Grandmother   . Diabetes Maternal Grandfather   . Stroke Maternal Grandfather   . Hypertension Maternal Grandfather     Social History   Tobacco Use  . Smoking status: Never Smoker  . Smokeless tobacco: Never Used  Substance Use Topics  . Alcohol use: No  . Drug use: No    Home Medications Prior to Admission medications   Medication Sig Start Date End Date Taking? Authorizing Provider  aspirin EC 81 MG tablet Take 81 mg by mouth daily as needed for mild pain.     [provider]  dexamethasone (DECADRON) 6 MG tablet Take 1 tablet (6 mg total) by mouth daily. 11/27/19   Raeford Razor, MD  hydrochlorothiazide (HYDRODIURIL)  12.5 MG tablet Take 1 tablet (12.5 mg total) by mouth daily. 07/23/17   Azalia Bilis, MD  lisinopril (PRINIVIL,ZESTRIL) 10 MG tablet Take 1 tablet (10 mg total) by mouth daily. 07/23/17   Azalia Bilis, MD  naproxen sodium (ANAPROX) 220 MG tablet Take 440 mg by mouth daily as needed (for pain).    [provider]  Psyllium (METAMUCIL FIBER PO) Take 1 tablet by mouth daily as needed (for bowl movement).    [provider]  zinc gluconate 50 MG tablet Take 1 tablet (50 mg total) by mouth daily for 14 days. 11/26/19 12/10/19  Vanetta Mulders, MD    Allergies    Patient has no known allergies.  Review of Systems   Review of Systems All systems reviewed and negative, other than as noted in HPI.  Physical Exam Updated Vital Signs BP (!) 158/87   Pulse (!) 103   Temp 99 F (37.2 C) (Oral)   Resp (!) 22   LMP 12/18/2011   SpO2 96%  Physical Exam Vitals and nursing note reviewed.  Constitutional:      General: She is not in acute distress.    Appearance: She is well-developed.  HENT:     Head: Normocephalic and atraumatic.  Eyes:     General:        Right eye: No discharge.        Left eye: No discharge.     Conjunctiva/sclera: Conjunctivae normal.  Cardiovascular:     Rate and Rhythm: Regular rhythm. Tachycardia present.     Heart sounds: Normal heart sounds. No murmur. No friction rub. No gallop.   Pulmonary:     Effort: Pulmonary effort is normal. No respiratory distress.     Breath sounds: Normal breath sounds.  Abdominal:     General: There is no distension.     Palpations: Abdomen is soft.     Tenderness: There is no abdominal tenderness.  Musculoskeletal:        General: No tenderness.     Cervical back: Neck supple.     Comments: Lower extremities symmetric as compared to each other. No calf tenderness. Negative Homan's. No palpable cords.   Skin:    General: Skin is warm and dry.  Neurological:     Mental Status: She is alert.  Psychiatric:         Behavior: Behavior normal.        Thought Content: Thought content normal.     ED Results / Procedures / Treatments   Labs (all labs ordered are listed, but only abnormal results are displayed) Labs Reviewed - No data to display  EKG None  Radiology No results found.  Procedures Procedures (including critical care time)  Medications Ordered in ED Medications  albuterol (VENTOLIN HFA) 108 (90 Base) MCG/ACT inhaler 2 puff (2 puffs Inhalation Given 11/27/19 2308)  dexamethasone (DECADRON) tablet 6 mg (6 mg Oral Given 11/27/19 2309)    ED Course  I have reviewed the triage vital signs and the nursing notes.  Pertinent labs & imaging results that were available during my care of the patient were reviewed by me and considered in my medical decision making (see chart for details).    MDM Rules/Calculators/A&P                      54 year old female with dyspnea.  Known Covid positive.  She reports O2 sat down to 90% at home.  She is in the mid 90s here on room air.  She is in no obvious distress.  She had imaging 2 days ago.  I do not feel strongly that she needs repeat imaging again today.  She does qualify for outpatient bamlanivimab.  She was called and message was left.  She was informed of this and to check her voicemail.  Prescribed decadron. Can try albuterol PRN. Return precautions discussed.   [facility] was evaluated in Emergency Department on 11/27/2019 for the symptoms described in the history of present illness. She was evaluated in the context of the global COVID-19 pandemic, which necessitated consideration that the patient might be at risk for infection with the SARS-CoV-2 virus that causes COVID-19. Institutional protocols and algorithms that pertain to the evaluation of patients at risk for COVID-19 are in a state of rapid change based on information released by regulatory bodies including the CDC and federal and state organizations. These policies and algorithms  were followed during the patient's care in the ED.   Final Clinical Impression(s) /  ED Diagnoses Final diagnoses:  COVID-19 virus infection    Rx / DC Orders ED Discharge Orders         Ordered    dexamethasone (DECADRON) 6 MG tablet  Daily     11/27/19 2320           Raeford Razor, MD 12/01/19 1645

## 2020-05-27 DIAGNOSIS — Z0289 Encounter for other administrative examinations: Secondary | ICD-10-CM

## 2020-12-11 ENCOUNTER — Ambulatory Visit
Admission: EM | Admit: 2020-12-11 | Discharge: 2020-12-11 | Disposition: A | Discharge status: Home / Self Care | Payer: Self-pay | Attending: Emergency Medicine | Admitting: Emergency Medicine

## 2020-12-11 ENCOUNTER — Ambulatory Visit (INDEPENDENT_AMBULATORY_CARE_PROVIDER_SITE_OTHER): Payer: Self-pay

## 2020-12-11 ENCOUNTER — Other Ambulatory Visit: Payer: Self-pay | Admitting: Emergency Medicine

## 2020-12-11 ENCOUNTER — Other Ambulatory Visit: Payer: Self-pay

## 2020-12-11 DIAGNOSIS — Z1231 Encounter for screening mammogram for malignant neoplasm of breast: Secondary | ICD-10-CM

## 2020-12-11 DIAGNOSIS — R079 Chest pain, unspecified: Secondary | ICD-10-CM

## 2020-12-11 DIAGNOSIS — R0789 Other chest pain: Secondary | ICD-10-CM

## 2020-12-11 IMAGING — DX DG CHEST 2V
2 series · 2 of 2 positions shown · non-contrast
Comparison: [DATE].

CLINICAL DATA: Chest pain.

EXAM:
CHEST - 2 VIEW

[chest pa]
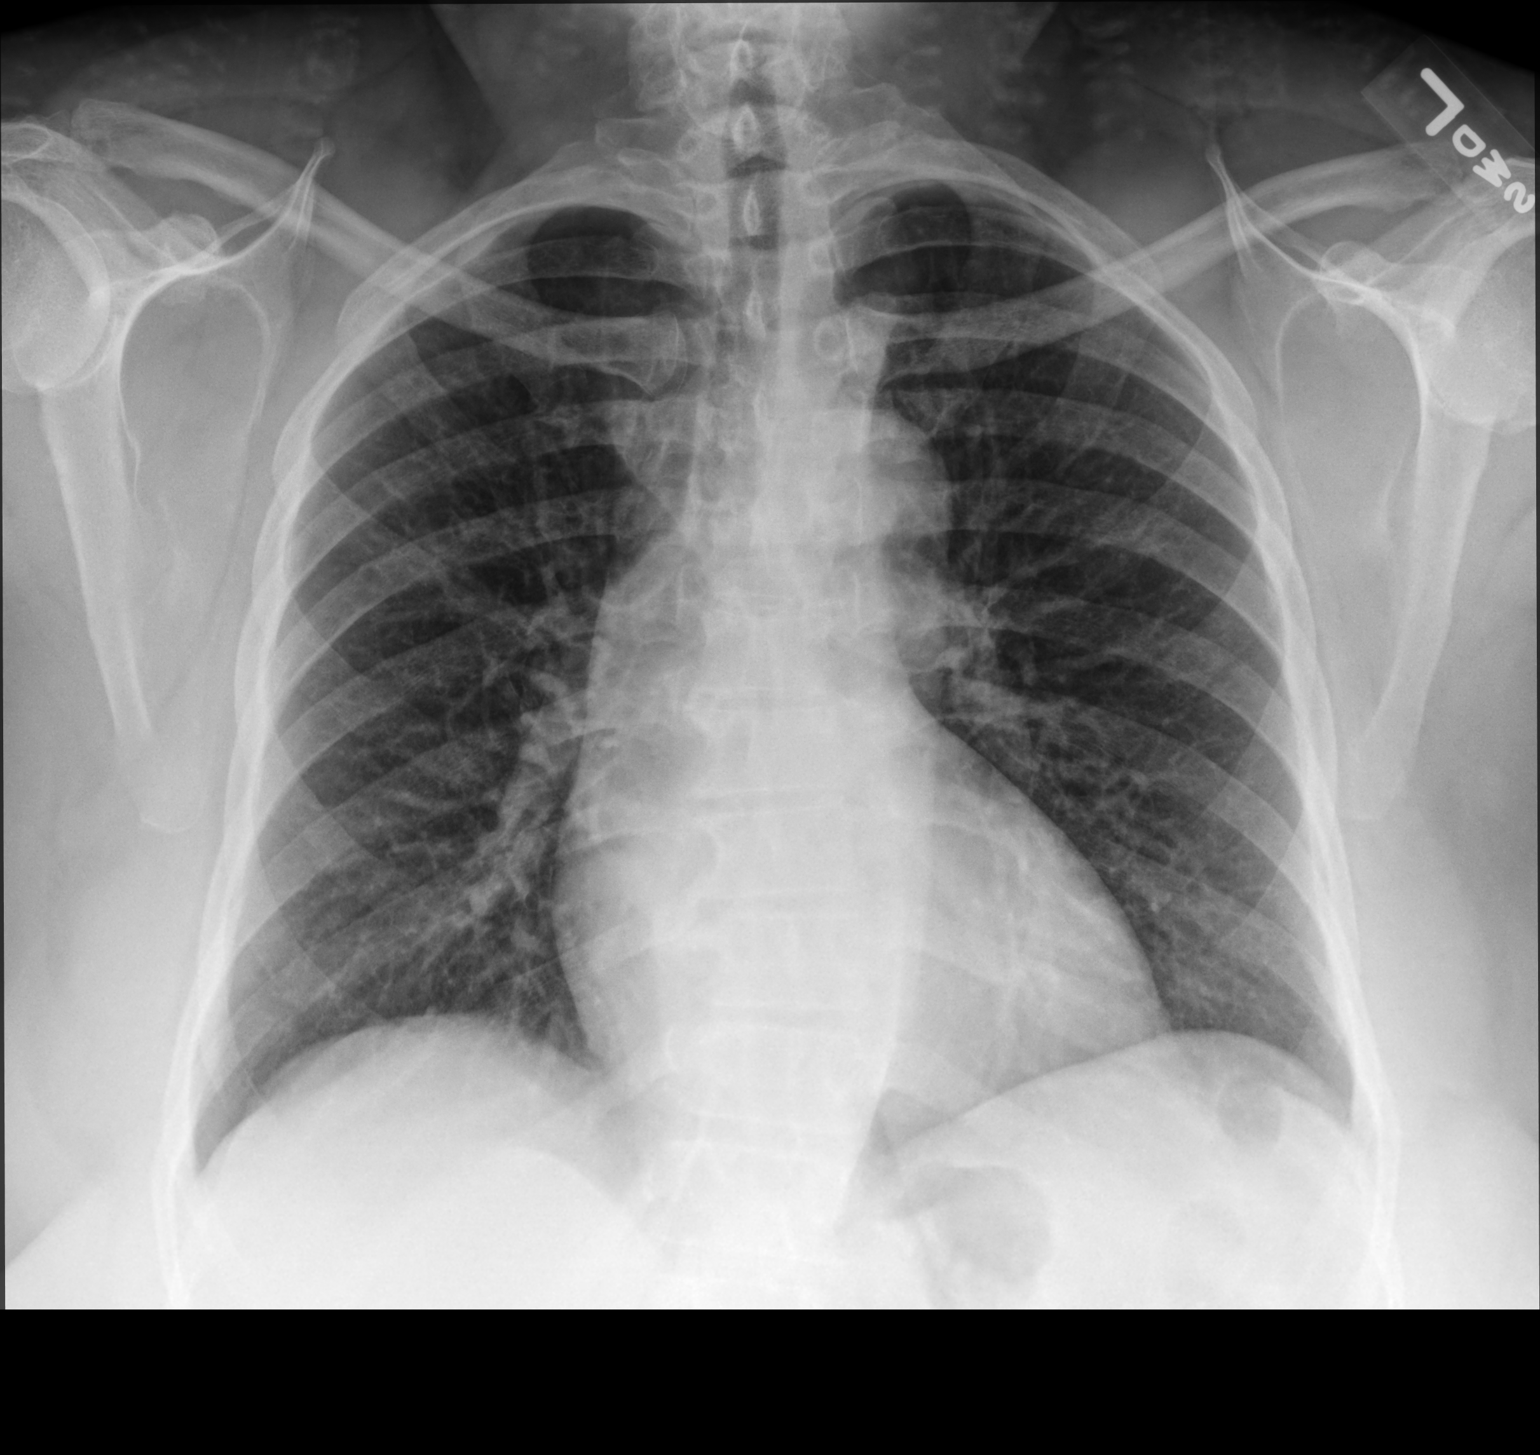

[chest lat]
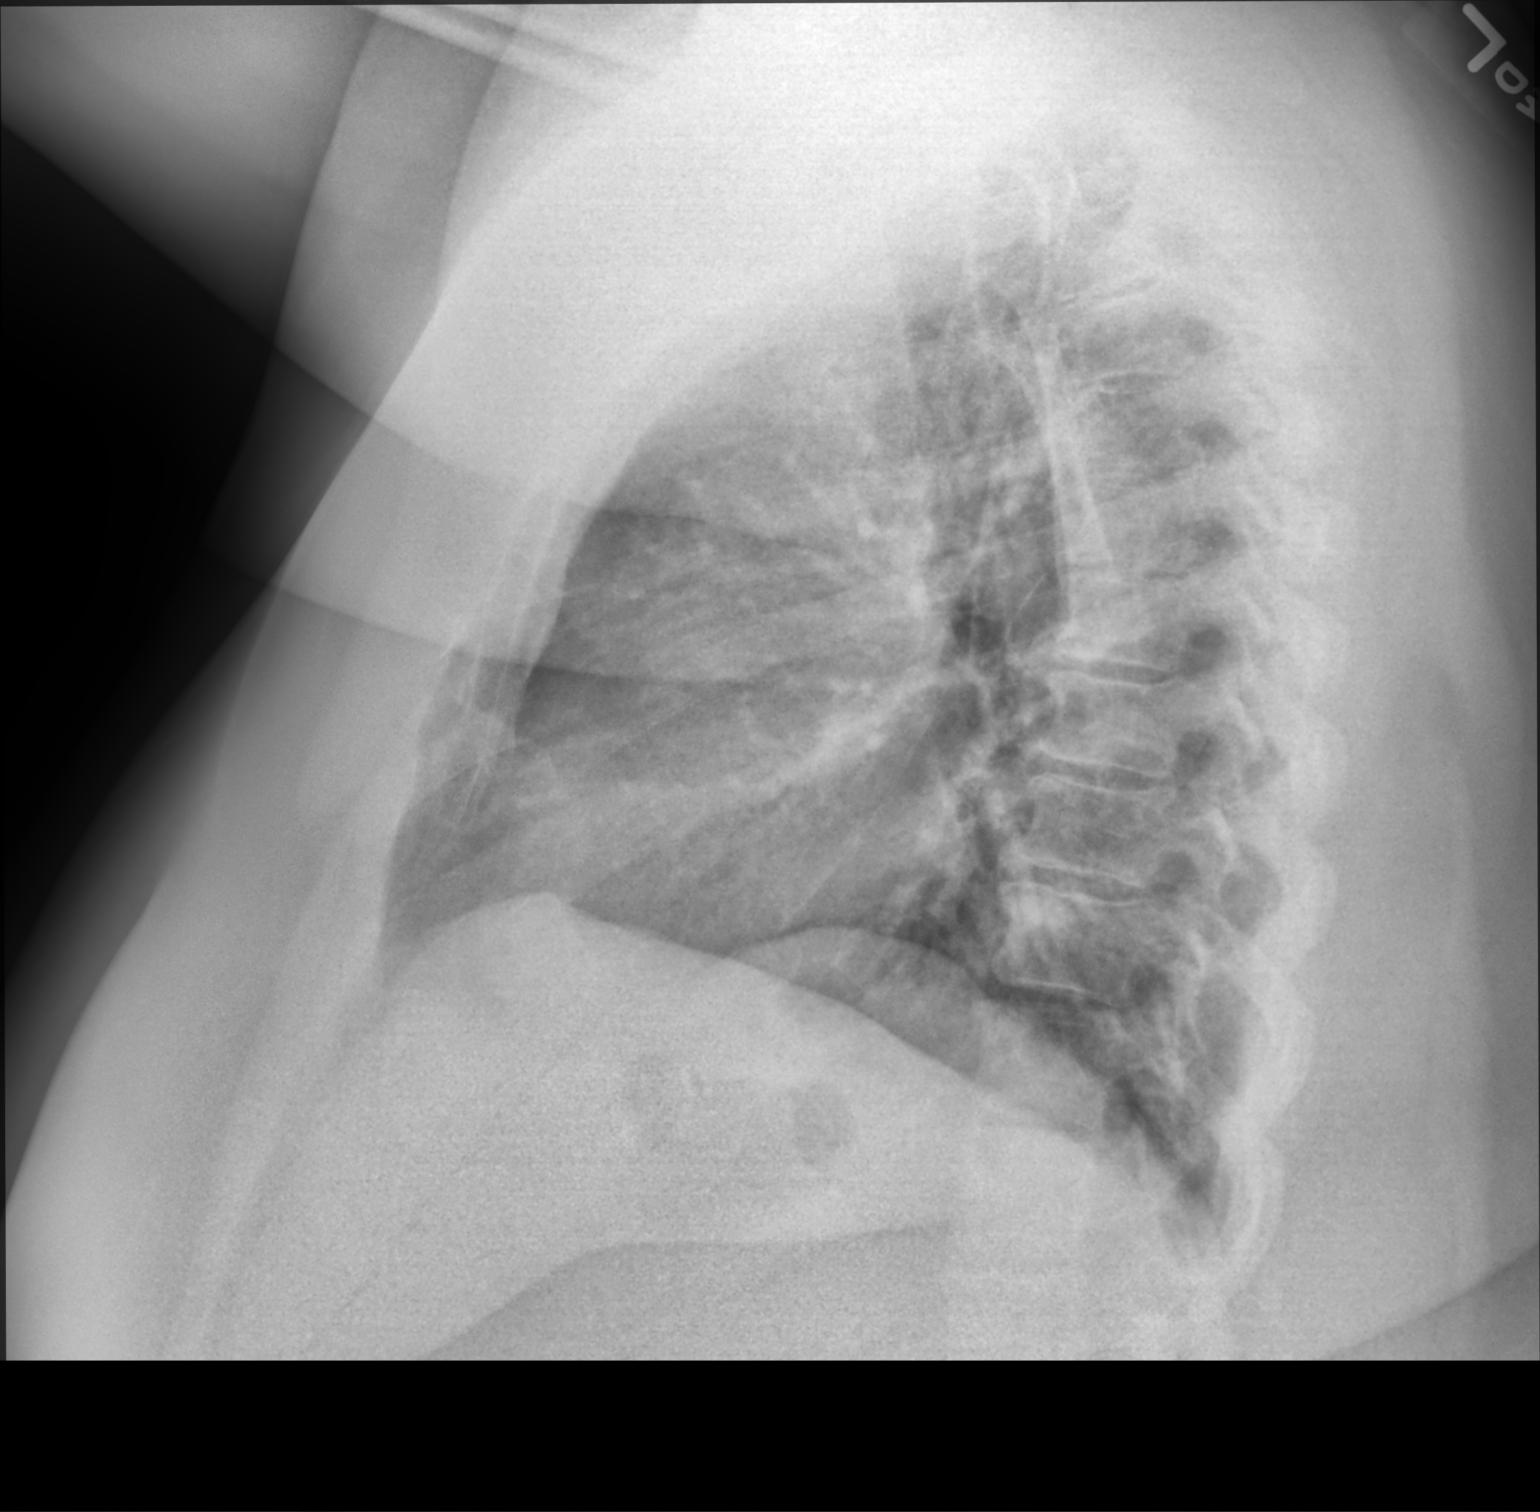

[2 of 2 positions shown; findings below may reference images not displayed]

FINDINGS: The heart size and mediastinal contours are within normal limits.
Both lungs are clear. No pneumothorax or pleural effusion is noted.
The visualized skeletal structures are unremarkable.
IMPRESSION: No active cardiopulmonary disease.

## 2020-12-11 MED ORDER — NAPROXEN 500 MG PO TABS: 500.0000 mg | ORAL_TABLET | Freq: Two times a day (BID) | ORAL | 0 refills | Status: DC

## 2020-12-11 NOTE — Discharge Instructions (Signed)
EKG normal Chest x-ray normal Begin Naprosyn twice daily with food consistently over the next 7 to 10 days Please continue to monitor symptoms Follow-up with cardiology if continuing to have heart fluttering/chest discomfort If noticing symptoms worse with lying flat, after meals try over-the-counter Pepcid twice daily  Please return if any symptoms not improving or worsening

## 2020-12-11 NOTE — ED Provider Notes (Signed)
[name]    CSN: [id number] Arrival date & time: 12/11/20  0844      History   Chief Complaint Chief Complaint  Patient presents with  . Chest Pain    HPI [facility] is a 55 y.o. female history of hypertension, hyperlipidemia, narcolepsy presenting today for evaluation of chest pain.  Patient reports that she has had central chest pain that radiates to back for approximately 1 year.  Reports that occasionally she will feel an area that feels raised above her sternum.  Over the past 2 weeks pain waking out of sleep and feeling heart fluttering and short of breath.  Denies currently.  Does feel discomfort with stretching arms and back at times as well. Infrequent coughing. COVID infection approximately 1 year ago.  Patient reports a knot sensation in her lower sternum/breast tissue, comes and goes.  Has not had a mammogram for a few years.  Reports history of breast cancer in family.  HPI   Past Medical History:  Diagnosis Date  . Hyperlipidemia   . Hypertension   . Narcolepsy     Patient Active Problem List   Diagnosis Date Noted  . 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 Other chest pain 06/04/2015  . Morbid obesity (HCC) 06/04/2015  . Breast pain, right 11/02/2012    Past Surgical History:  Procedure Laterality Date  . ABDOMINAL HYSTERECTOMY    . CESAREAN SECTION     x 2  . dermoid tumor      OB History    Gravida  4   Para  2   Term  2   Preterm      AB  2   Living  2     SAB      IAB      Ectopic      Multiple      Live Births               Home Medications    Prior to Admission medications   Medication Sig Start Date End Date Taking? Authorizing Provider  naproxen (NAPROSYN) 500 MG tablet Take 1 tablet (500 mg total) by mouth 2 (two) times daily. 12/11/20  Yes Illyanna Petillo C, PA-C  aspirin EC 81 MG tablet Take 81 mg by mouth daily as needed for mild pain.     [provider]    Family History Family History   Problem Relation Age of Onset  . Cancer Mother 19       breast  . Breast cancer Mother   . Cancer Maternal Grandmother        breast  . Diabetes Maternal Grandmother   . Breast cancer Maternal Grandmother   . Diabetes Maternal Grandfather   . Stroke Maternal Grandfather   . Hypertension Maternal Grandfather     Social History Social History   Tobacco Use  . Smoking status: Never Smoker  . Smokeless tobacco: Never Used  Substance Use Topics  . Alcohol use: No  . Drug use: No     Allergies   Patient has no known allergies.   Review of Systems Review of Systems  Constitutional: Negative for activity change, appetite change, chills, fatigue and fever.  HENT: Negative for congestion, ear pain, rhinorrhea, sinus pressure, sore throat and trouble swallowing.   Eyes: Negative for discharge and redness.  Respiratory: Negative for cough, chest tightness and shortness of breath.   Cardiovascular: Positive for chest pain and palpitations. Negative for leg swelling.  Gastrointestinal: Negative  for abdominal pain, diarrhea, nausea and vomiting.  Musculoskeletal: Negative for myalgias.  Skin: Negative for rash.  Neurological: Negative for dizziness, light-headedness and headaches.     Physical Exam Triage Vital Signs ED Triage Vitals [12/11/20 0901]  Enc Vitals Group     BP (!) 150/96     Pulse Rate 81     Resp 18     Temp 98.2 F (36.8 C)     Temp Source Oral     SpO2 97 %     Weight      Height      Head Circumference      Peak Flow      Pain Score 0     Pain Loc      Pain Edu?      Excl. in GC?    No data found.  Updated Vital Signs BP (!) 150/96 (BP Location: Left Arm)   Pulse 81   Temp 98.2 F (36.8 C) (Oral)   Resp 18   LMP 12/18/2011   SpO2 97%   Visual Acuity Right Eye Distance:   Left Eye Distance:   Bilateral Distance:    Right Eye Near:   Left Eye Near:    Bilateral Near:     Physical Exam Vitals and nursing note reviewed.   Constitutional:      Appearance: She is well-developed and well-nourished.     Comments: No acute distress  HENT:     Head: Normocephalic and atraumatic.     Nose: Nose normal.     Mouth/Throat:     Comments: Oral mucosa pink and moist, no tonsillar enlargement or exudate. Posterior pharynx patent and nonerythematous, no uvula deviation or swelling. Normal phonation. Eyes:     Conjunctiva/sclera: Conjunctivae normal.  Cardiovascular:     Rate and Rhythm: Normal rate and regular rhythm.  Pulmonary:     Effort: Pulmonary effort is normal. No respiratory distress.     Comments: Breathing comfortably at rest, CTABL, no wheezing, rales or other adventitious sounds auscultated  No palpable knot or masses palpated within lower sternal area or breast tissue bilaterally Abdominal:     General: There is no distension.  Musculoskeletal:        General: Normal range of motion.     Cervical back: Neck supple.     Comments: Bilateral periscapular areas with tenderness to palpation, mild tenderness to palpation throughout anterior chest as well  Bilateral lower legs symmetric without calf swelling or tenderness  Skin:    General: Skin is warm and dry.  Neurological:     Mental Status: She is alert and oriented to person, place, and time.  Psychiatric:        Mood and Affect: Mood and affect normal.      UC Treatments / Results  Labs (all labs ordered are listed, but only abnormal results are displayed) Labs Reviewed - No data to display  EKG   Radiology DG Chest 2 View  Result Date: 12/11/2020 CLINICAL DATA:  Chest pain. EXAM: CHEST - 2 VIEW COMPARISON:  November 25, 2019. FINDINGS: The heart size and mediastinal contours are within normal limits. Both lungs are clear. No pneumothorax or pleural effusion is noted. The visualized skeletal structures are unremarkable. IMPRESSION: No active cardiopulmonary disease. Electronically Signed   By: Lupita Raider M.D.   On: 12/11/2020 09:42     Procedures Procedures (including critical care time)  Medications Ordered in UC Medications - No data to display  Initial  Impression / Assessment and Plan / UC Course  I have reviewed the triage vital signs and the nursing notes.  Pertinent labs & imaging results that were available during my care of the patient were reviewed by me and considered in my medical decision making (see chart for details).     1.  Chest wall pain-EKG normal sinus rhythm, no acute signs of ischemia or infarction, nonspecific T wave inversion noted in lead III and V3, present on prior EKGs; chest x-ray unremarkable, lower suspicion of GERD, treating for chest wall pain with anti-inflammatories over the next week, did recommend to follow-up with cardiology for further evaluation of palpitations if persisting.  2.  Mammogram screening-breast center referral placed for updating mammogram  Discussed strict return precautions. Patient verbalized understanding and is agreeable with plan.  Final Clinical Impressions(s) / UC Diagnoses   Final diagnoses:  Chest wall pain     Discharge Instructions     EKG normal Chest x-ray normal Begin Naprosyn twice daily with food consistently over the next 7 to 10 days Please continue to monitor symptoms Follow-up with cardiology if continuing to have heart fluttering/chest discomfort If noticing symptoms worse with lying flat, after meals try over-the-counter Pepcid twice daily  Please return if any symptoms not improving or worsening    ED Prescriptions    Medication Sig Dispense Auth. Provider   naproxen (NAPROSYN) 500 MG tablet Take 1 tablet (500 mg total) by mouth 2 (two) times daily. 30 tablet Kendi Defalco, Conroy C, PA-C     PDMP not reviewed this encounter.   Seryna Marek, Snyder C, PA-C 12/11/20 1019

## 2020-12-11 NOTE — ED Triage Notes (Signed)
Pt c/o center chest pain radiating through to back for over a year. States sometimes she feels a raised area to center breast bone. States for the past 2 weeks she was waking up in her sleep with heart fluttering and can't catch her breath. No distress at this time. Denies chest pain at this time unless she stretches her arms back.

## 2021-04-15 ENCOUNTER — Other Ambulatory Visit: Payer: Self-pay

## 2021-04-15 ENCOUNTER — Encounter (HOSPITAL_BASED_OUTPATIENT_CLINIC_OR_DEPARTMENT_OTHER): Payer: Self-pay | Admitting: Emergency Medicine

## 2021-04-15 ENCOUNTER — Emergency Department (HOSPITAL_BASED_OUTPATIENT_CLINIC_OR_DEPARTMENT_OTHER): Payer: Self-pay

## 2021-04-15 ENCOUNTER — Ambulatory Visit
Admission: EM | Admit: 2021-04-15 | Discharge: 2021-04-15 | Disposition: A | Discharge status: Home / Self Care | Payer: Self-pay | Attending: Student | Admitting: Student

## 2021-04-15 ENCOUNTER — Emergency Department (HOSPITAL_BASED_OUTPATIENT_CLINIC_OR_DEPARTMENT_OTHER)
Admission: EM | Admit: 2021-04-15 | Discharge: 2021-04-16 | Disposition: A | Discharge status: Home / Self Care | Payer: Self-pay | Attending: Emergency Medicine | Admitting: Emergency Medicine

## 2021-04-15 DIAGNOSIS — R519 Headache, unspecified: Secondary | ICD-10-CM | POA: Insufficient documentation

## 2021-04-15 DIAGNOSIS — I160 Hypertensive urgency: Secondary | ICD-10-CM

## 2021-04-15 DIAGNOSIS — R42 Dizziness and giddiness: Secondary | ICD-10-CM | POA: Insufficient documentation

## 2021-04-15 DIAGNOSIS — I10 Essential (primary) hypertension: Secondary | ICD-10-CM | POA: Insufficient documentation

## 2021-04-15 LAB — CBC WITH DIFFERENTIAL/PLATELET
Abs Immature Granulocytes: 0.03 10*3/uL (ref 0.00–0.07)
Basophils Absolute: 0 10*3/uL (ref 0.0–0.1)
Basophils Relative: 0 %
Eosinophils Absolute: 0.1 10*3/uL (ref 0.0–0.5)
Eosinophils Relative: 1 %
HCT: 41.5 % (ref 36.0–46.0)
Hemoglobin: 13.3 g/dL (ref 12.0–15.0)
Immature Granulocytes: 0 %
Lymphocytes Relative: 42 %
Lymphs Abs: 4.1 10*3/uL — ABNORMAL HIGH (ref 0.7–4.0)
MCH: 27.4 pg (ref 26.0–34.0)
MCHC: 32 g/dL (ref 30.0–36.0)
MCV: 85.6 fL (ref 80.0–100.0)
Monocytes Absolute: 0.5 10*3/uL (ref 0.1–1.0)
Monocytes Relative: 5 %
Neutro Abs: 4.9 10*3/uL (ref 1.7–7.7)
Neutrophils Relative %: 52 %
Platelets: 286 10*3/uL (ref 150–400)
RBC: 4.85 MIL/uL (ref 3.87–5.11)
RDW: 12.5 % (ref 11.5–15.5)
WBC: 9.7 10*3/uL (ref 4.0–10.5)
nRBC: 0 % (ref 0.0–0.2)

## 2021-04-15 LAB — BASIC METABOLIC PANEL
Anion gap: 9 (ref 5–15)
BUN: 10 mg/dL (ref 6–20)
CO2: 29 mmol/L (ref 22–32)
Calcium: 9.8 mg/dL (ref 8.9–10.3)
Chloride: 100 mmol/L (ref 98–111)
Creatinine, Ser: 0.88 mg/dL (ref 0.44–1.00)
GFR, Estimated: >60 mL/min (ref >60)
Glucose, Bld: 100 mg/dL — ABNORMAL HIGH (ref 70–99)
Potassium: 3.9 mmol/L (ref 3.5–5.1)
Sodium: 138 mmol/L (ref 135–145)

## 2021-04-15 LAB — POCT FASTING CBG KUC MANUAL ENTRY: POCT Glucose (KUC): 105 mg/dL — AB (ref 70–99)

## 2021-04-15 IMAGING — CT CT HEAD W/O CM
4 of 6 series · 16 of 47 positions shown, 18 images · non-contrast
Comparison: [DATE]

CLINICAL DATA: Dizziness

EXAM:
CT HEAD WITHOUT CONTRAST
TECHNIQUE: Contiguous axial images were obtained from the base of the skull
through the vertex without intravenous contrast.

[Series 2: head wo · axial · 0.45mm/px · z∈[-79,+21]mm · 5 of 31 slices shown, 7 images]
[im 6/31  brain]
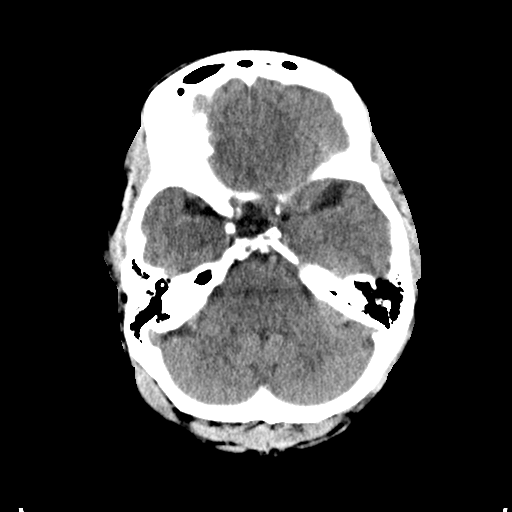
[im 6/31  bone]
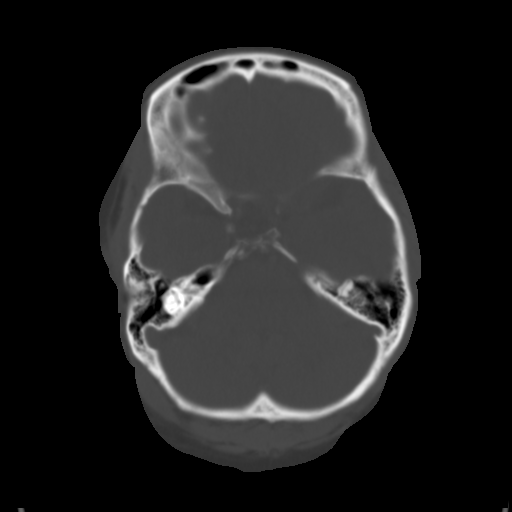
[im 11/31  brain]
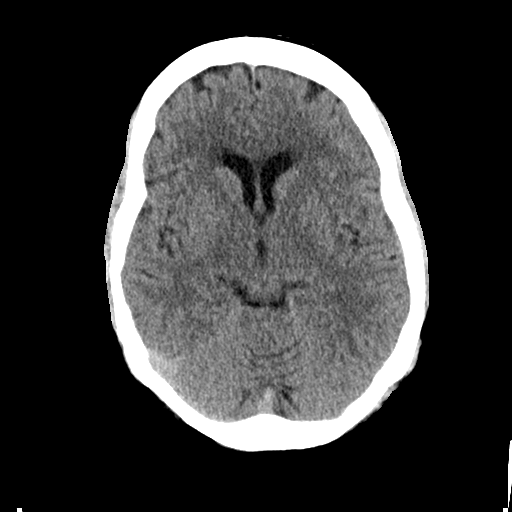
[im 16/31  brain]
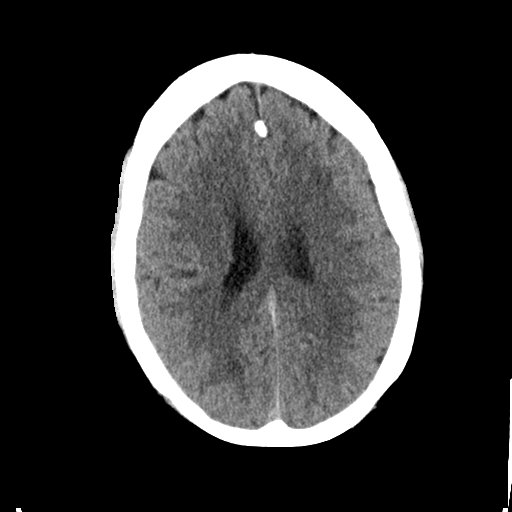
[im 21/31  brain]
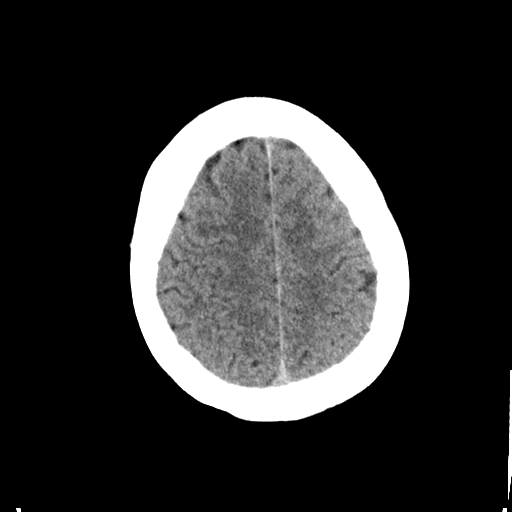
[im 26/31  brain]
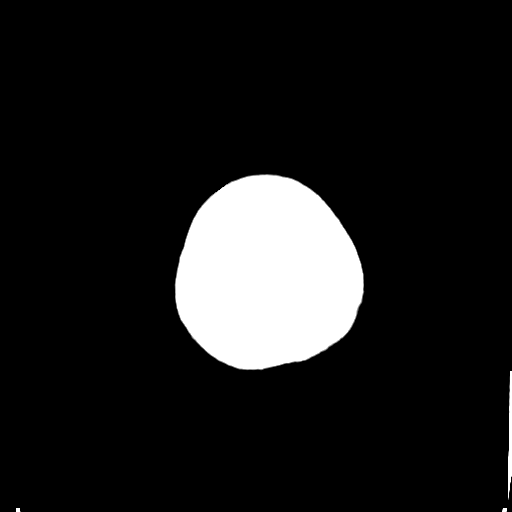
[im 26/31  bone]
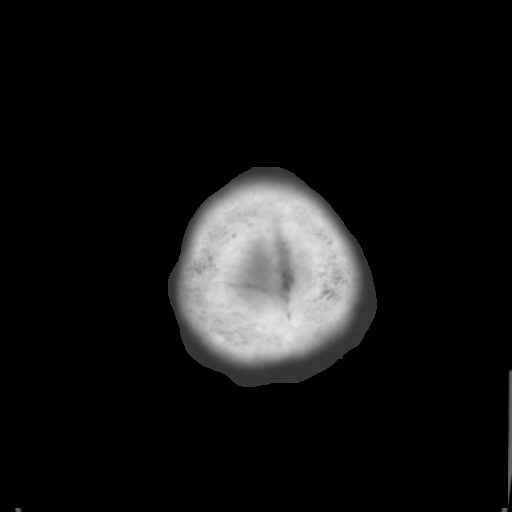

[Series 4: coronal soft · coronal · 0.33mm/px · 3 of 64 slices shown]
[im 16/64  brain]
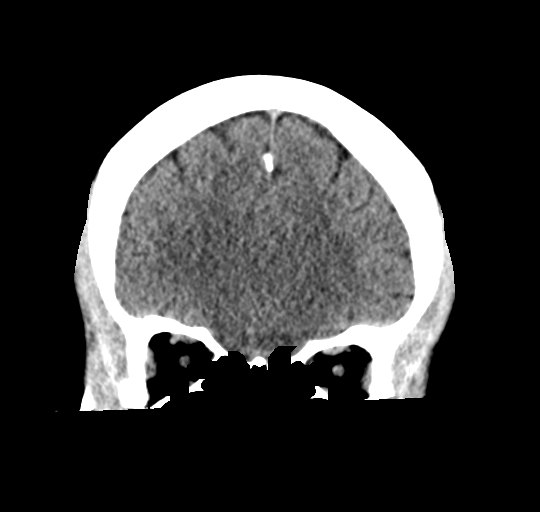
[im 32/64  brain]
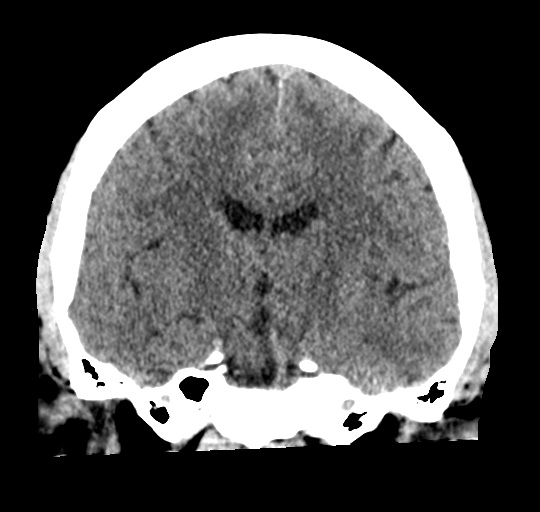
[im 48/64  brain]
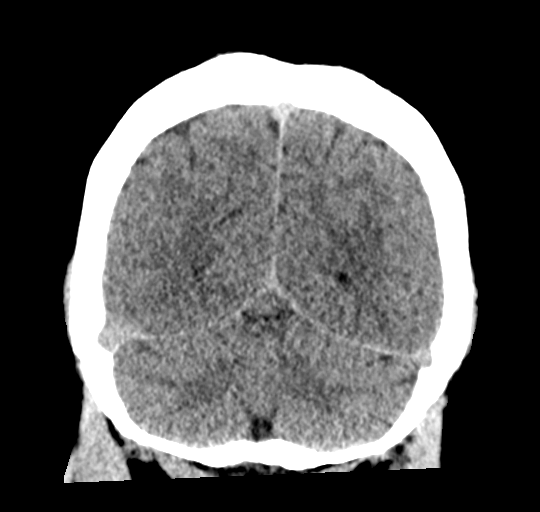

[Series 5: sagittal soft · sagittal · 0.33mm/px · 3 of 54 slices shown]
[im 18/54  brain]
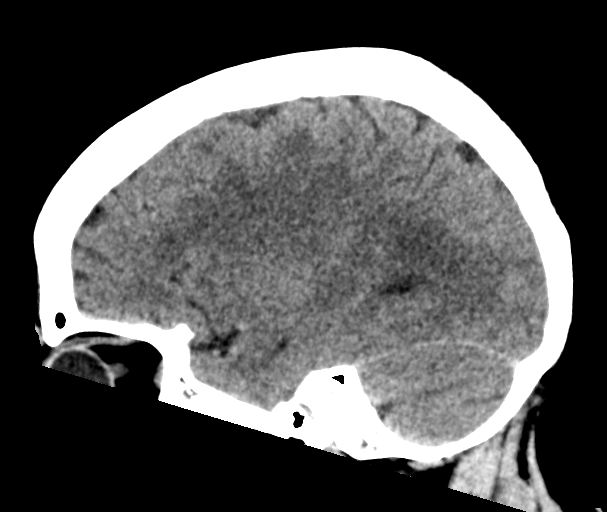
[im 27/54  brain]
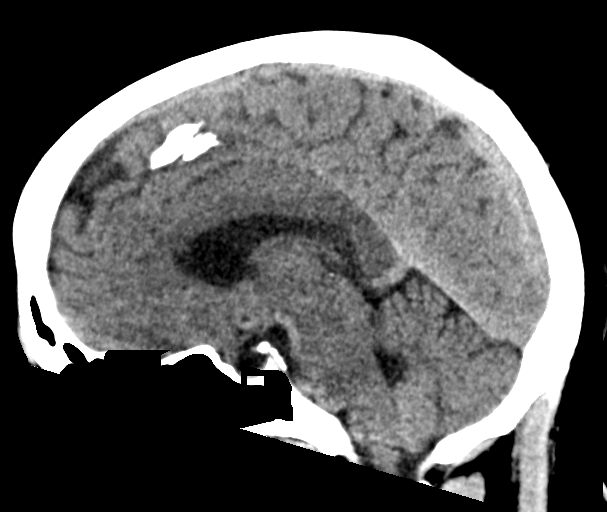
[im 36/54  brain]
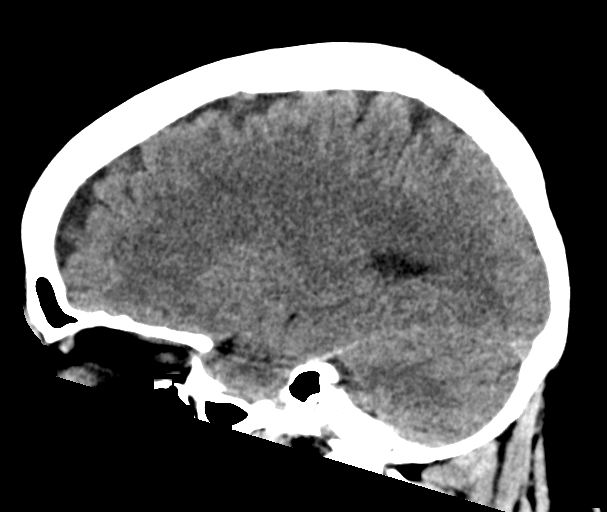

[Series 6: head wo ax · axial · 0.34mm/px · z∈[-126,-24]mm · 5 of 33 slices shown]
[im 6/33  brain]
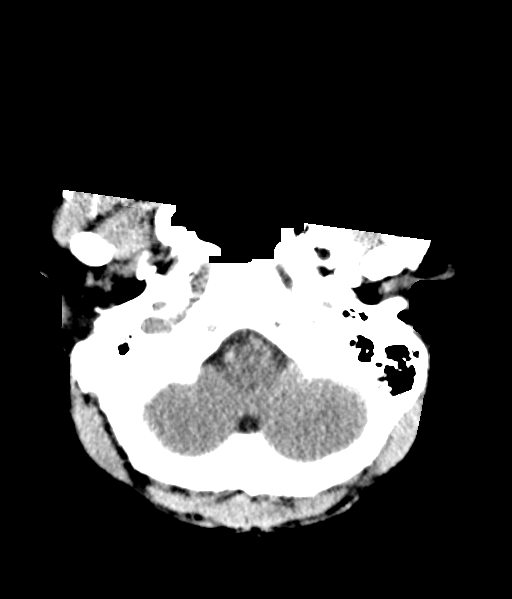
[im 11/33  brain]
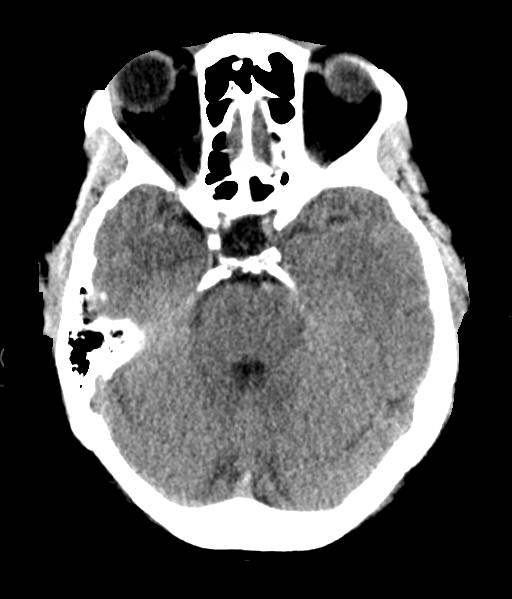
[im 17/33  brain]
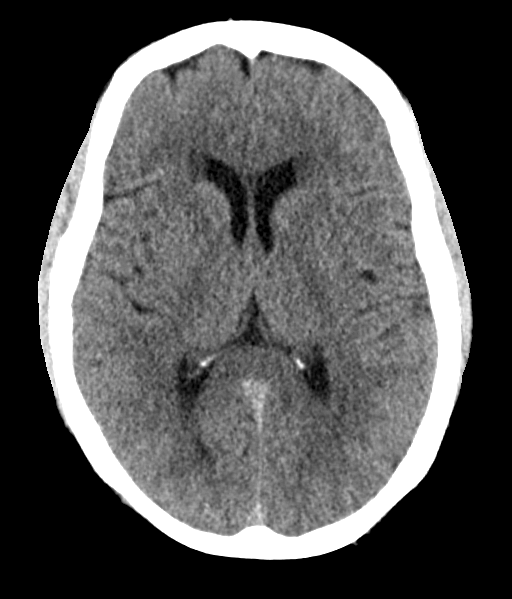
[im 22/33  brain]
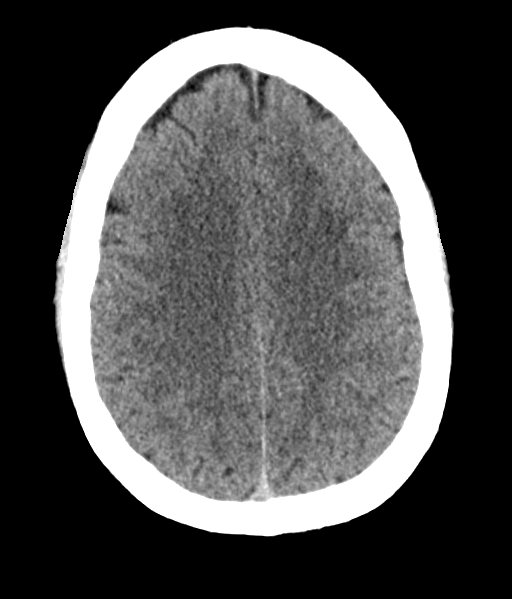
[im 27/33  brain]
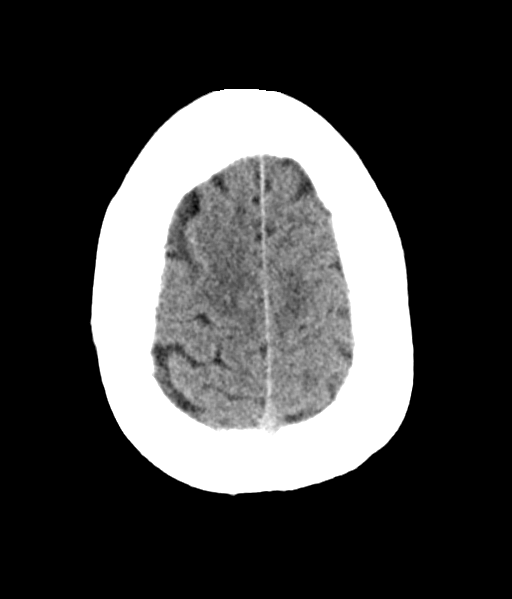

[16 of 47 positions shown; findings below may reference images not displayed]

FINDINGS: Brain: Normal anatomic configuration. No abnormal intra or
extra-axial mass lesion or fluid collection. No abnormal mass effect
or midline shift. No evidence of acute intracranial hemorrhage or
infarct. Ventricular size is normal. Cerebellum unremarkable.

Vascular: Unremarkable

Skull: Intact

Sinuses/Orbits: Paranasal sinuses are clear. Orbits are
unremarkable.

Other: Mastoid air cells and middle ear cavities are clear.
IMPRESSION: No acute intracranial abnormality.  Normal exam.

## 2021-04-15 MED ORDER — LORAZEPAM 1 MG PO TABS
1.0000 mg | ORAL_TABLET | Freq: Once | ORAL | Status: AC
  Administered 2021-04-15: 1 mg via ORAL
  Filled 2021-04-15: qty 1

## 2021-04-15 MED ORDER — METOCLOPRAMIDE HCL 5 MG/ML IJ SOLN
10.0000 mg | Freq: Once | INTRAMUSCULAR | Status: AC
  Administered 2021-04-15: 10 mg via INTRAVENOUS
  Filled 2021-04-15: qty 2

## 2021-04-15 MED ORDER — KETOROLAC TROMETHAMINE 15 MG/ML IJ SOLN
15.0000 mg | Freq: Once | INTRAMUSCULAR | Status: AC
  Administered 2021-04-15: 15 mg via INTRAVENOUS
  Filled 2021-04-15: qty 1

## 2021-04-15 NOTE — ED Notes (Signed)
States has had three days of headache.  States pain throughout head but worse right side above eye.  Pain worse with lying head down.  Dizziness with getting up or sometimes with walking

## 2021-04-15 NOTE — ED Provider Notes (Signed)
[name]    CSN: [id number] Arrival date & time: 04/15/21  1347      History   Chief Complaint Chief Complaint  Patient presents with   Headache   Dizziness    HPI [facility] is a 55 y.o. female presenting with headache and dizziness for 2 days.  Medical history hypertension, narcolepsy, hyperlipidemia, morbid obesity.  Was previously on antihypertensives but not for the last 4 years.  Describes her headache as throbbing 5/10 behind forehead. Constant dizziness/lightheadedness. Denies worst headache of life, thunderclap headache, weakness/sensation changes in arms/legs, vision changes, shortness of breath, chest pain/pressure, photophobia, phonophobia, n/v/d, chest pain, shortness of breath, syncope.    HPI  Past Medical History:  Diagnosis Date   Hyperlipidemia    Hypertension    Narcolepsy     Patient Active Problem List   Diagnosis Date Noted   Narcolepsy 06/29/2016   Other chest pain 06/04/2015   Morbid obesity (HCC) 06/04/2015   Breast pain, right 11/02/2012    Past Surgical History:  Procedure Laterality Date   ABDOMINAL HYSTERECTOMY     CESAREAN SECTION     x 2   dermoid tumor      OB History     Gravida  4   Para  2   Term  2   Preterm      AB  2   Living  2      SAB      IAB      Ectopic      Multiple      Live Births               Home Medications    Prior to Admission medications   Medication Sig Start Date End Date Taking? Authorizing Provider  aspirin EC 81 MG tablet Take 81 mg by mouth daily as needed for mild pain.     [provider]  naproxen (NAPROSYN) 500 MG tablet Take 1 tablet (500 mg total) by mouth 2 (two) times daily. 12/11/20   Wieters, Junius Creamer, PA-C    Family History Family History  Problem Relation Age of Onset   Cancer Mother 53       breast   Breast cancer Mother    Cancer Maternal Grandmother        breast   Diabetes Maternal Grandmother    Breast cancer  Maternal Grandmother    Diabetes Maternal Grandfather    Stroke Maternal Grandfather    Hypertension Maternal Grandfather     Social History Social History   Tobacco Use   Smoking status: Never   Smokeless tobacco: Never  Substance Use Topics   Alcohol use: No   Drug use: No     Allergies   Patient has no known allergies.   Review of Systems Review of Systems  Constitutional:  Negative for appetite change, chills, fatigue and fever.  HENT:  Negative for congestion, sinus pressure, sore throat, trouble swallowing and voice change.   Eyes:  Negative for photophobia, pain, discharge, redness, itching and visual disturbance.  Respiratory:  Negative for cough, chest tightness and shortness of breath.   Cardiovascular:  Negative for chest pain, palpitations and leg swelling.  Gastrointestinal:  Negative for abdominal pain, constipation, diarrhea, nausea and vomiting.  Genitourinary:  Negative for dysuria, flank pain, frequency and urgency.  Musculoskeletal:  Negative for back pain, gait problem, myalgias, neck pain and neck stiffness.  Neurological:  Positive for dizziness and headaches. Negative for tremors,  seizures, syncope, facial asymmetry, speech difficulty, weakness, light-headedness and numbness.  Psychiatric/Behavioral:  Negative for agitation, decreased concentration, dysphoric mood, hallucinations and suicidal ideas. The patient is not nervous/anxious.   All other systems reviewed and are negative.   Physical Exam Triage Vital Signs ED Triage Vitals  Enc Vitals Group     BP 04/15/21 1540 (!) 165/118     Pulse Rate 04/15/21 1540 75     Resp 04/15/21 1540 18     Temp 04/15/21 1540 98.3 F (36.8 C)     Temp Source 04/15/21 1540 Oral     SpO2 04/15/21 1540 98 %     Weight --      Height --      Head Circumference --      Peak Flow --      Pain Score 04/15/21 1541 5     Pain Loc --      Pain Edu? --      Excl. in GC? --    No data found.  Updated Vital  Signs BP (!) 165/118 (BP Location: Left Arm)   Pulse 75   Temp 98.3 F (36.8 C) (Oral)   Resp 18   LMP 12/18/2011   SpO2 98%   Visual Acuity Right Eye Distance:   Left Eye Distance:   Bilateral Distance:    Right Eye Near:   Left Eye Near:    Bilateral Near:     Physical Exam Vitals reviewed.  Constitutional:      Appearance: Normal appearance. She is not diaphoretic.  HENT:     Head: Normocephalic and atraumatic.     Mouth/Throat:     Mouth: Mucous membranes are moist.  Eyes:     Extraocular Movements: Extraocular movements intact.     Pupils: Pupils are equal, round, and reactive to light.  Cardiovascular:     Rate and Rhythm: Normal rate and regular rhythm.     Pulses:          Radial pulses are 2+ on the right side and 2+ on the left side.     Heart sounds: Normal heart sounds.  Pulmonary:     Effort: Pulmonary effort is normal.     Breath sounds: Normal breath sounds.  Abdominal:     Palpations: Abdomen is soft.     Tenderness: There is no abdominal tenderness. There is no guarding or rebound.  Musculoskeletal:     Right lower leg: No edema.     Left lower leg: No edema.  Skin:    General: Skin is warm.     Capillary Refill: Capillary refill takes less than 2 seconds.  Neurological:     General: No focal deficit present.     Mental Status: She is alert and oriented to person, place, and time.     Cranial Nerves: Cranial nerves are intact. No cranial nerve deficit.     Sensory: Sensation is intact. No sensory deficit.     Motor: Motor function is intact. No weakness.     Coordination: Coordination is intact. Romberg sign negative.     Gait: Gait is intact. Gait normal.     Comments: CN 2-12 grossly intact. PERRLA, EOMI. Negative rhomberg, pronator drift. Normal fingers to thumb.   Psychiatric:        Mood and Affect: Mood normal.        Behavior: Behavior normal.        Thought Content: Thought content normal.  Judgment: Judgment normal.     UC  Treatments / Results  Labs (all labs ordered are listed, but only abnormal results are displayed) Labs Reviewed  POCT FASTING CBG [facility] MANUAL ENTRY - Abnormal; Notable for the following components:      Result Value   POCT Glucose ([facility]) 105 (*)    All other components within normal limits    EKG   Radiology No results found.  Procedures Procedures (including critical care time)  Medications Ordered in UC Medications - No data to display  Initial Impression / Assessment and Plan / UC Course  I have reviewed the triage vital signs and the nursing notes.  Pertinent labs & imaging results that were available during my care of the patient were reviewed by me and considered in my medical decision making (see chart for details).     This patient is a very pleasant 54 y.o. year old female presenting with headaches, dizziness, and BP 165/118.  History of hypertension that was previously controlled on lisinopril, has been off of this for 4 years.   EKG NSR, unchanged from 2021 EKG. Nonfasting CBG 105.  Sending this patient to the emergency department for further evaluation and management of hypertensive urgency.  She is hemodynamically stable for family member to drive her at this time.  Final Clinical Impressions(s) / UC Diagnoses   Final diagnoses:  Hypertensive urgency  Dizziness  Acute nonintractable headache, unspecified headache type     Discharge Instructions      -Head straight to the emergency department for further evaluation and management of your elevated blood pressure associated with headaches and dizziness.  These symptoms are concerning for an acute stroke.  Make sure that your niece or husband drives you straight to the ER.  If symptoms get worse on the way, like headaches, vision changes, weakness- stop and call 911.     ED Prescriptions   None    PDMP not reviewed this encounter.   Rhys Martini, PA-C 04/15/21 1702

## 2021-04-15 NOTE — ED Notes (Signed)
This RN spoke with MRI who said to go ahead and give pt meds for MRI

## 2021-04-15 NOTE — ED Provider Notes (Signed)
[facility] EMERGENCY DEPT Provider Note   CSN: [id number] Arrival date & time: 04/15/21  1644     History Chief Complaint  Patient presents with   Dizziness    Michele Garcia is a 55 y.o. female.  HPI     55 year old female comes in with chief complaint of dizziness and headaches.  She has history of hypertension, hyperlipidemia and narcolepsy.  She reports that about 3 to 4 days ago she started having headaches.  Headaches are frontal, about 6 out of 10 , worse at night and when she is laying flat with no other aggravating or relieving factors.  She has also had some associated dizziness.  Today her headache and dizziness is worse.  When she walks she feels like she is favoring more to the right side.  She denies any new focal numbness, weakness, vision change, slurred speech.  No history of strokes.  No family history of brain aneurysm, brain bleed, brain tumor and patient denies any clotting disorder for her or family.  Past Medical History:  Diagnosis Date   Hyperlipidemia    Hypertension    Narcolepsy     Patient Active Problem List   Diagnosis Date Noted   Narcolepsy 06/29/2016   Other chest pain 06/04/2015   Morbid obesity (HCC) 06/04/2015   Breast pain, right 11/02/2012    Past Surgical History:  Procedure Laterality Date   ABDOMINAL HYSTERECTOMY     CESAREAN SECTION     x 2   dermoid tumor       OB History     Gravida  4   Para  2   Term  2   Preterm      AB  2   Living  2      SAB      IAB      Ectopic      Multiple      Live Births              Family History  Problem Relation Age of Onset   Cancer Mother 13       breast   Breast cancer Mother    Cancer Maternal Grandmother        breast   Diabetes Maternal Grandmother    Breast cancer Maternal Grandmother    Diabetes Maternal Grandfather    Stroke Maternal Grandfather    Hypertension Maternal Grandfather     Social History   Tobacco Use    Smoking status: Never   Smokeless tobacco: Never  Substance Use Topics   Alcohol use: No   Drug use: No    Home Medications Prior to Admission medications   Medication Sig Start Date End Date Taking? Authorizing Provider  aspirin EC 81 MG tablet Take 81 mg by mouth daily as needed for mild pain.     [provider]  naproxen (NAPROSYN) 500 MG tablet Take 1 tablet (500 mg total) by mouth 2 (two) times daily. 12/11/20   Wieters, Hallie C, PA-C    Allergies    Patient has no known allergies.  Review of Systems   Review of Systems  Constitutional:  Positive for activity change.  Eyes:  Negative for visual disturbance.  Gastrointestinal:  Negative for nausea and vomiting.  Musculoskeletal:  Negative for neck pain and neck stiffness.  Allergic/Immunologic: Negative for immunocompromised state.  Neurological:  Positive for headaches.  Hematological:  Does not bruise/bleed easily.  All other systems reviewed and are negative.  Physical  Exam Updated Vital Signs BP (!) 154/91   Pulse 76   Temp 98 F (36.7 C) (Oral)   Resp 18   Ht 5\' 5"  (1.651 m)   Wt 122.9 kg   LMP 12/18/2011   SpO2 99%   BMI 45.10 kg/m   Physical Exam Vitals and nursing note reviewed.  Constitutional:      Appearance: She is well-developed.  HENT:     Head: Atraumatic.  Eyes:     Extraocular Movements: Extraocular movements intact.     Pupils: Pupils are equal, round, and reactive to light.     Comments: No papilledema  Neck:     Comments: No meningismus Cardiovascular:     Rate and Rhythm: Normal rate.  Pulmonary:     Effort: Pulmonary effort is normal.  Musculoskeletal:     Cervical back: Normal range of motion and neck supple. No rigidity.  Skin:    General: Skin is warm and dry.  Neurological:     Mental Status: She is alert and oriented to person, place, and time.     Cranial Nerves: No cranial nerve deficit.     Sensory: No sensory deficit.     Motor: No weakness.      Coordination: Coordination normal.    ED Results / Procedures / Treatments   Labs (all labs ordered are listed, but only abnormal results are displayed) Labs Reviewed  BASIC METABOLIC PANEL - Abnormal; Notable for the following components:      Result Value   Glucose, Bld 100 (*)    All other components within normal limits  CBC WITH DIFFERENTIAL/PLATELET - Abnormal; Notable for the following components:   Lymphs Abs 4.1 (*)    All other components within normal limits    EKG None  Radiology CT Head Wo Contrast  Result Date: 04/15/2021 CLINICAL DATA:  Dizziness EXAM: CT HEAD WITHOUT CONTRAST TECHNIQUE: Contiguous axial images were obtained from the base of the skull through the vertex without intravenous contrast. COMPARISON:  05/24/2011 FINDINGS: Brain: Normal anatomic configuration. No abnormal intra or extra-axial mass lesion or fluid collection. No abnormal mass effect or midline shift. No evidence of acute intracranial hemorrhage or infarct. Ventricular size is normal. Cerebellum unremarkable. Vascular: Unremarkable Skull: Intact Sinuses/Orbits: Paranasal sinuses are clear. Orbits are unremarkable. Other: Mastoid air cells and middle ear cavities are clear. IMPRESSION: No acute intracranial abnormality.  Normal exam. Electronically Signed   By: 05/26/2011 MD   On: 04/15/2021 20:09    Procedures Procedures   Medications Ordered in ED Medications  metoCLOPramide (REGLAN) injection 10 mg (10 mg Intravenous Given 04/15/21 2057)  ketorolac (TORADOL) 15 MG/ML injection 15 mg (15 mg Intravenous Given 04/15/21 2056)    ED Course  I have reviewed the triage vital signs and the nursing notes.  Pertinent labs & imaging results that were available during my care of the patient were reviewed by me and considered in my medical decision making (see chart for details).    MDM Rules/Calculators/A&P                           55 year old female with history of hypertension,  hyperlipidemia and narcolepsy comes in with chief complaint of headache and dizziness.  Her headaches are worse at nighttime and when she is laying flat.  She has no papilledema on my evaluation.  She denies any visual disturbance.  The dizziness is described as unsteadiness and it has worsened today along  with the headache.  There is no vertigo and her cerebellar exam did not reveal any dysmetria.  CT head ordered it is negative for any acute findings.  Differential diagnosis includes idiopathic intracranial hypertension, brain tumor, subdural bleed, venous thrombosis.  The headache is not thunderclap, at its peak it is 6 out of 10.  Doubt intracranial bleed.  She is not having any neck pain or meningismus.  No concerns for infection or vertebral dissection.  Patient will be transferred to [facility] for MRV brain with and without contrast.  If her work-up is negative she will be advised to follow-up with neurologist as an outpatient for possible work-up for idiopathic intracranial hypertension.  Patient has been made aware of this plan and recommendation prior to her transfer to [facility]. Husband at bedside - he will drive her.  Final Clinical Impression(s) / ED Diagnoses Final diagnoses:  Acute nonintractable headache, unspecified headache type  Dizziness    Rx / DC Orders ED Discharge Orders     None        Derwood Kaplan, MD 04/15/21 2116

## 2021-04-15 NOTE — ED Notes (Signed)
Called [facility] to transport patient to [facility] for MRI 

## 2021-04-15 NOTE — Discharge Instructions (Addendum)
You were seen in the ER for headache and dizziness.  You are being transferred to [facility] to get MRV of your brain. If your MRI is normal, then you will need to see the neurologist for further management of your headache. Take tylenol 650 mg every 6 hours for headache and ibuprofen 600 mg in between if needed.  Your MRI and MRV are normal. There is a small narrowing of a blood vessel on your MRA.  Please return to the ER if the headache gets severe and in not improving, you have associated new one sided numbness, tingling, weakness or confusion, seizures, poor balance or poor vision.

## 2021-04-15 NOTE — ED Notes (Signed)
Handoff report given to [facility] ED change nurse for ER to ER transfer

## 2021-04-15 NOTE — ED Provider Notes (Signed)
Patient sent from OSH 4 days of intermittent diffuse frontal headaches that come and go associated with dizziness and lightheadedness.  No photophobia or phonophobia.  No fever or vomiting.  Sent for MRI and MRV.  Neurological exam is nonfocal.  No ataxia  MRI and MRV are negative.  MRA does show some short segment narrowing of M1 segment. No large vessel occlusion.  Patient without any recurrent headaches.  She is able to tolerate p.o. and ambulate.  Referred to neurology for follow-up.  Return precautions discussed   Glynn Octave, MD 04/16/21 [phone]

## 2021-04-15 NOTE — ED Notes (Signed)
Patient is going POV to [facility] to get MRI.  Dr Virgina Norfolk is accepting

## 2021-04-15 NOTE — ED Triage Notes (Signed)
Pt c/o sinus headache and dizziness x2-3 days. Denies blurred vision or nausea at this time.

## 2021-04-15 NOTE — Discharge Instructions (Addendum)
-  Head straight to the emergency department for further evaluation and management of your elevated blood pressure associated with headaches and dizziness.  These symptoms are concerning for an acute stroke.  Make sure that your niece or husband drives you straight to the ER.  If symptoms get worse on the way, like headaches, vision changes, weakness- stop and call 911.

## 2021-04-15 NOTE — ED Triage Notes (Signed)
Pt arrives to ED with c/o of dizziness and hypertension. Pt reports coming from UC with BP's in the 160's. Pt states her dizziness has been continuous x3 days gets worse at night. No visual abnormalities or weakness. States she stopped her Lisinopril x5 years ago due to cough.

## 2021-04-16 ENCOUNTER — Emergency Department (HOSPITAL_COMMUNITY): Payer: Self-pay

## 2021-04-16 IMAGING — MR MR MRA HEAD W/O CM
1 series · 17 of 48 positions shown · IV contrast (gadavist)
Comparison: Prior CT from [DATE].

CLINICAL DATA: Initial evaluation for acute dizziness, headaches
for several days.

EXAM:
MRI HEAD WITHOUT CONTRAST
MRA HEAD WITHOUT CONTRAST
MR VENOGRAM HEAD WITHOUT AND WITH CONTRAST
TECHNIQUE: Multiplanar, multi-echo pulse sequences of the brain and surrounding
structures were acquired without and with intravenous contrast.
Angiographic images of the Circle of Willis were acquired using MRA
technique without intravenous contrast.
CONTRAST:  10mL GADAVIST GADOBUTROL 1 MMOL/ML IV SOLN

[Series 5: 3d cow · axial · 0.5mm · 0.41mm/px · z∈[-127,-47]mm · 17 of 172 slices shown]
[im 1/172]
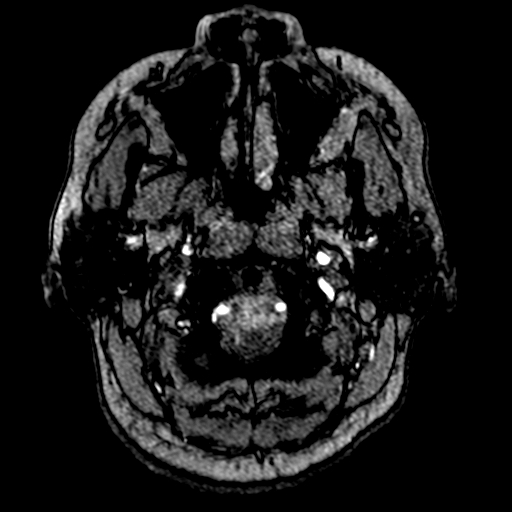
[im 4/172]
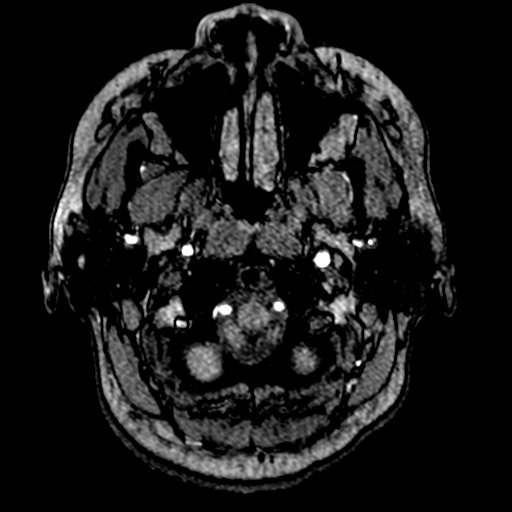
[im 8/172]
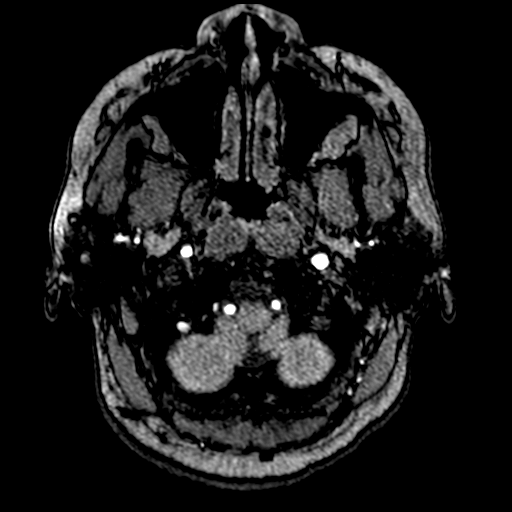
[im 11/172]
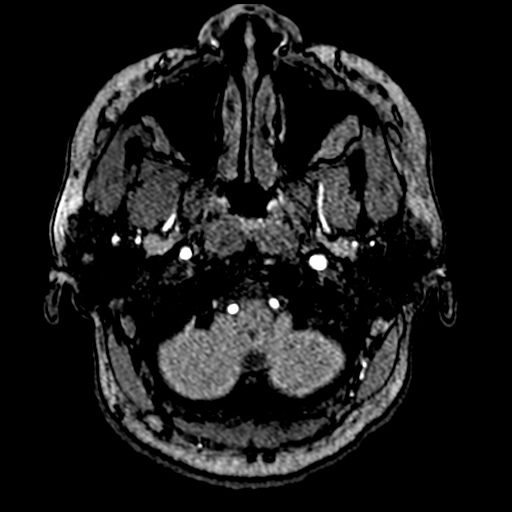
[im 15/172]
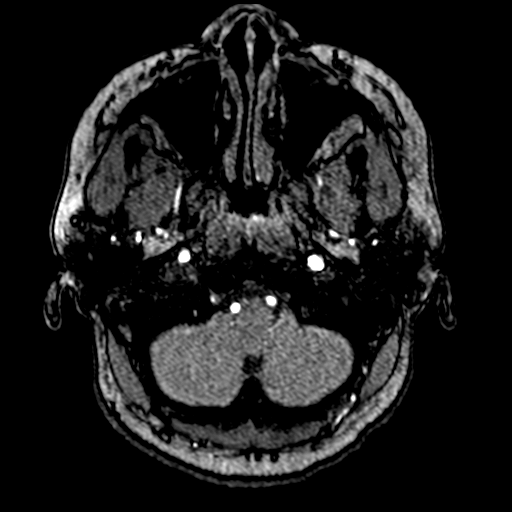
[im 19/172]
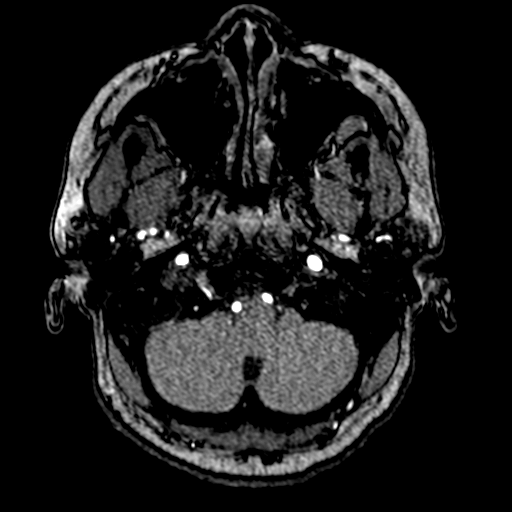
[im 22/172]
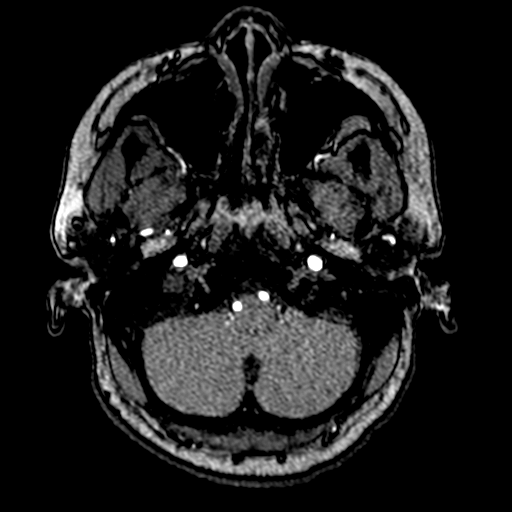
[im 30/172]
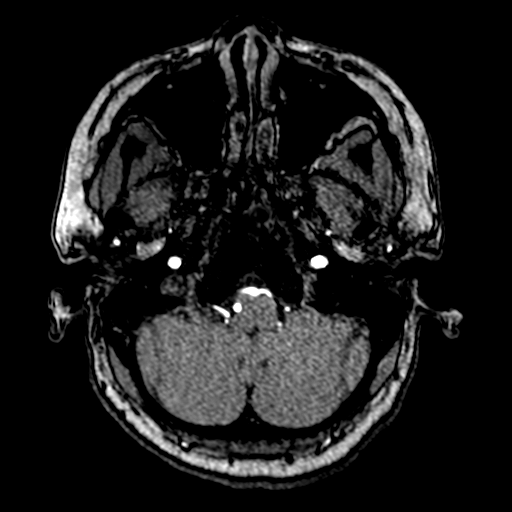
[im 33/172]
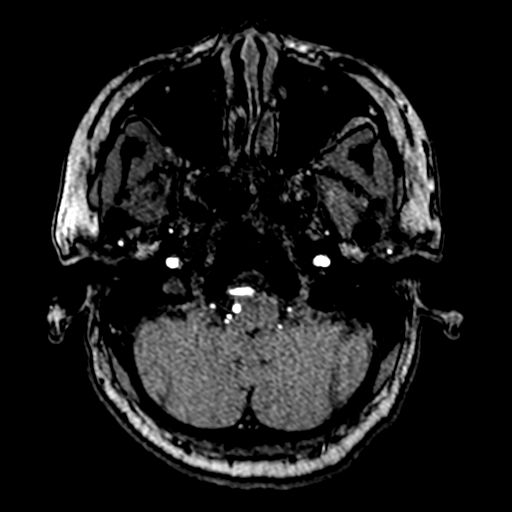
[im 55/172]
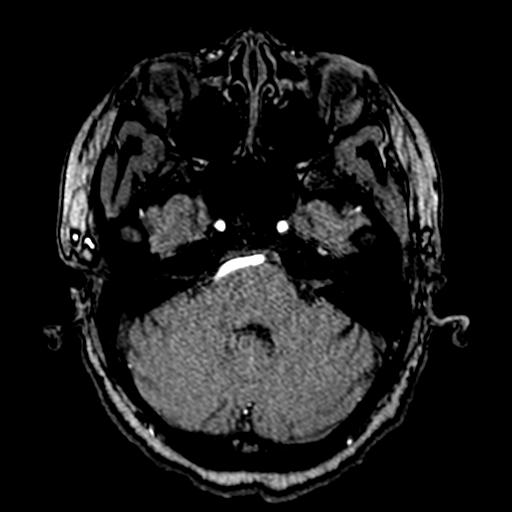
[im 77/172]
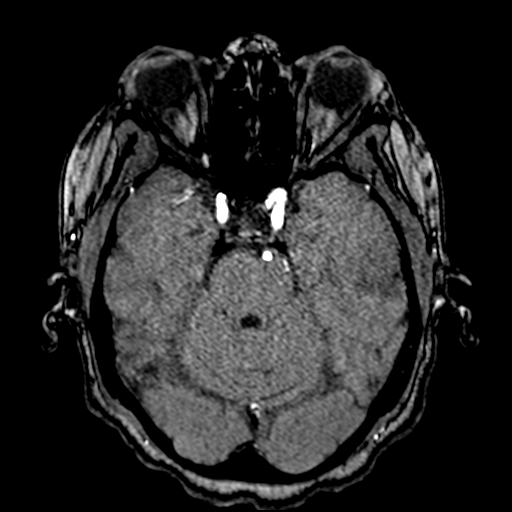
[im 88/172]
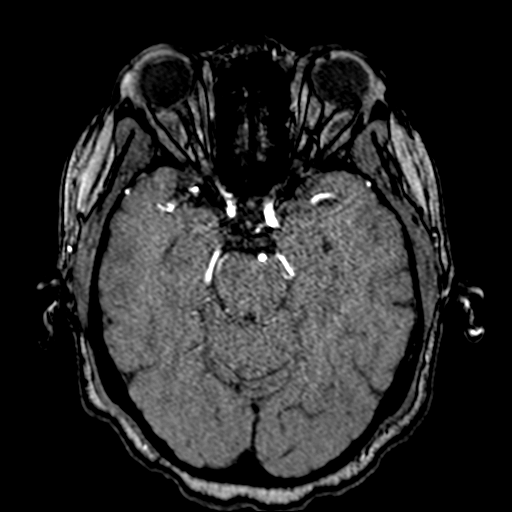
[im 99/172]
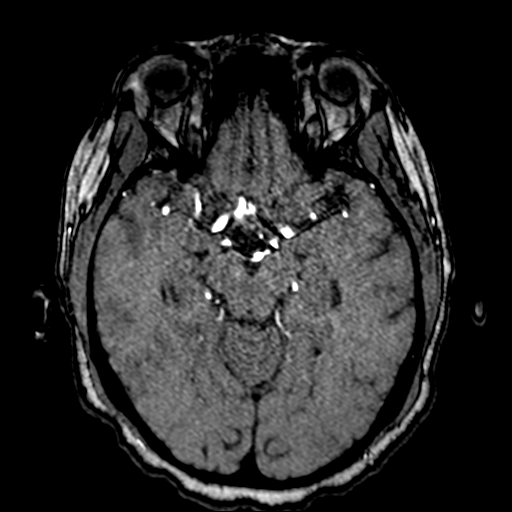
[im 121/172]
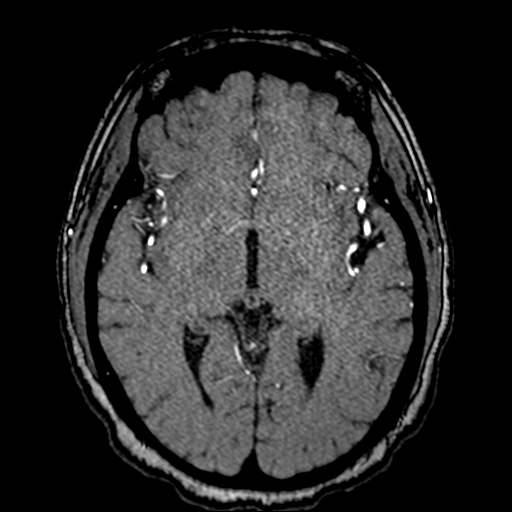
[im 142/172]
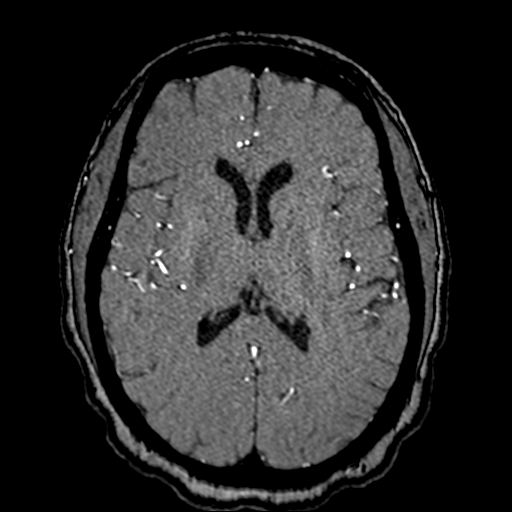
[im 146/172]
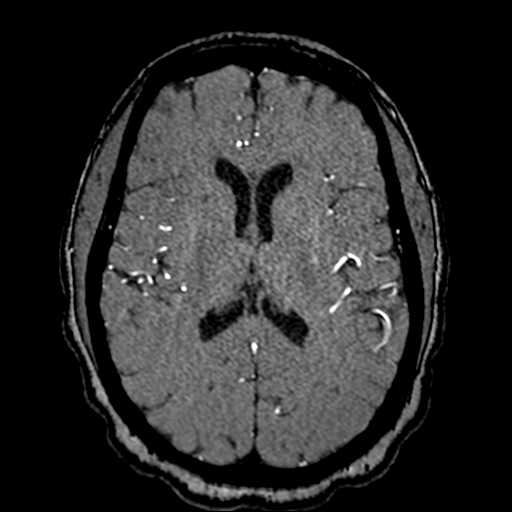
[im 164/172]
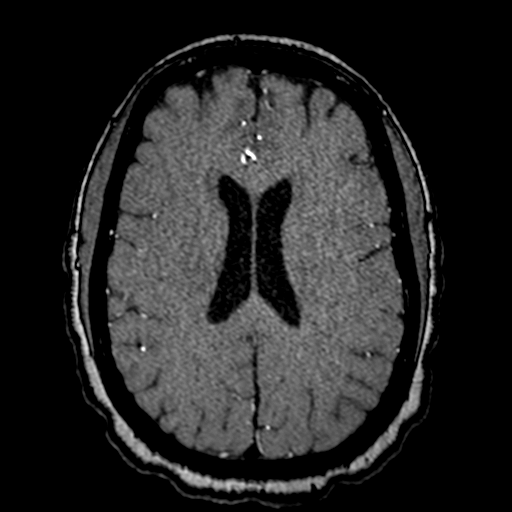

[17 of 48 positions shown; findings below may reference images not displayed]

FINDINGS: MRI HEAD FINDINGS

Brain: Examination mildly degraded by motion.

Mild diffuse prominence of the CSF containing spaces compatible with
generalized cerebral atrophy. Multiple associated dilated
perivascular spaces noted throughout the supratentorial cerebral
white matter. Patchy T2/FLAIR hyperintensity involving the
periventricular and deep white matter both cerebral hemispheres most
consistent with chronic small vessel ischemic disease, mild to
moderate in nature.

No abnormal foci of restricted diffusion to suggest acute or
subacute ischemia. Gray-white matter differentiation maintained. No
encephalomalacia to suggest chronic cortical infarction. No evidence
for acute intracranial hemorrhage.

No mass lesion, midline shift or mass effect. No hydrocephalus or
extra-axial fluid collection. Pituitary gland suprasellar region
within normal limits. Midline structures intact.

Vascular: Major intracranial vascular flow voids are maintained.
Normal flow voids seen within the major dural sinuses.

Skull and upper cervical spine: Craniocervical junction within
normal limits. Diffusely decreased T1 signal intensity seen within
the visualized bone marrow, nonspecific, but most commonly related
to anemia, smoking, or obesity. No focal marrow replacing lesion. No
scalp soft tissue abnormality.

Sinuses/Orbits: Globes and orbital soft tissues within normal
limits. Paranasal sinuses are clear. No mastoid effusion. Inner ear
structures grossly normal.

Other: None.

MRA HEAD FINDINGS

Anterior circulation: Visualized distal cervical segments of the
internal carotid arteries are patent with antegrade flow. Petrous,
cavernous, and supraclinoid segments patent without stenosis or
other abnormality. A1 segments patent bilaterally. Normal anterior
communicating artery complex. Anterior cerebral arteries patent to
their distal aspects without stenosis.

Left M1 segment widely patent. Short-segment mild stenosis noted
involving the mid right M1 segment (series [N5], image 13). Right M1
otherwise patent as well. Normal MCA bifurcations. Distal MCA
branches well perfused and symmetric.

Posterior circulation: Both vertebral arteries patent to the
vertebrobasilar junction without stenosis. Vertebral arteries are
largely code dominant. Both PICA origins patent and normal. Basilar
tortuous but widely patent to its distal aspect without stenosis.
Superior cerebellar arteries patent bilaterally. Both PCAs primarily
supplied via the basilar well perfused to their distal aspects.

Anatomic variants: None significant. No intracranial aneurysm or
other vascular abnormality. She

MRV HEAD FINDINGS

Normal flow related signal seen throughout the superior sagittal
sinus to the torcula. Torcula itself is patent. Transverse and
sigmoid sinuses are patent as are the visualized proximal internal
jugular veins. Right transverse sinus dominant, with a hypoplastic
left transverse sinus. Straight sinus, vein of DG, internal
cerebral veins, and basal veins of DG are patent. No evidence
for dural sinus thrombosis. No appreciable dural sinus stenosis. No
appreciable cortical vein thrombosis. No abnormality about the
cavernous sinus. Superior ophthalmic veins symmetric and within
normal limits.
IMPRESSION: MRI HEAD:

1. No acute intracranial abnormality.
2. Mild to moderate chronic microvascular ischemic disease.

MRA HEAD:

1. Short-segment mild stenosis involving the mid right M1 segment.
2. Otherwise normal intracranial MRA. No large vessel occlusion. No
other hemodynamically significant or correctable stenosis. No
intracranial aneurysm.

MRV HEAD:

Normal intracranial MRV.  No evidence for dural sinus thrombosis.

## 2021-04-16 IMAGING — MR MR MRV HEAD WO/W CM
5 of 6 series · 35 of 48 positions shown · IV contrast (gadavist)
Comparison: Prior CT from [DATE].

CLINICAL DATA: Initial evaluation for acute dizziness, headaches
for several days.

EXAM:
MRI HEAD WITHOUT CONTRAST
MRA HEAD WITHOUT CONTRAST
MR VENOGRAM HEAD WITHOUT AND WITH CONTRAST
TECHNIQUE: Multiplanar, multi-echo pulse sequences of the brain and surrounding
structures were acquired without and with intravenous contrast.
Angiographic images of the Circle of Willis were acquired using MRA
technique without intravenous contrast.
CONTRAST:  10mL GADAVIST GADOBUTROL 1 MMOL/ML IV SOLN

[Series 18: tof_fl2d_paracor · coronal · 2.0mm · 0.98mm/px · 7 of 128 slices shown]
[im 1/128]
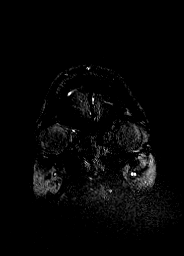
[im 22/128]
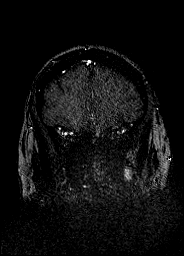
[im 43/128]
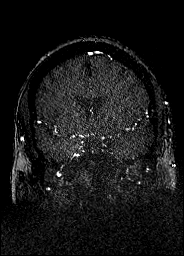
[im 64/128]
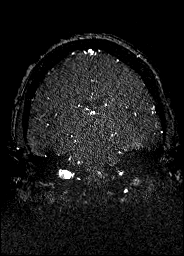
[im 85/128]
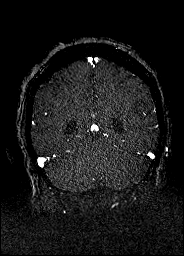
[im 106/128]
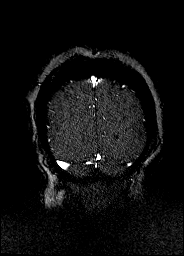
[im 128/128]
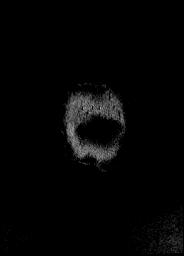

[Series 22: venous inhance coronal · coronal · portal-venous · 0.9mm · 0.57mm/px · 9 of 208 slices shown]
[im 1/208]
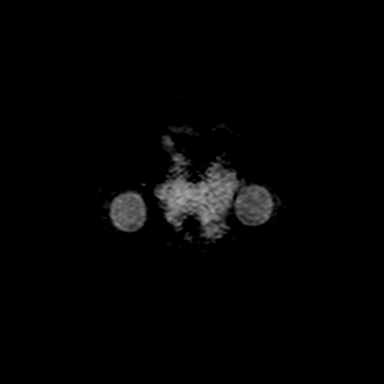
[im 38/208]
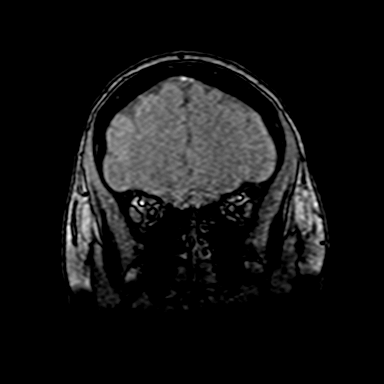
[im 57/208]
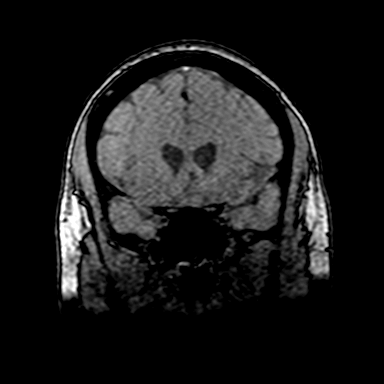
[im 95/208]
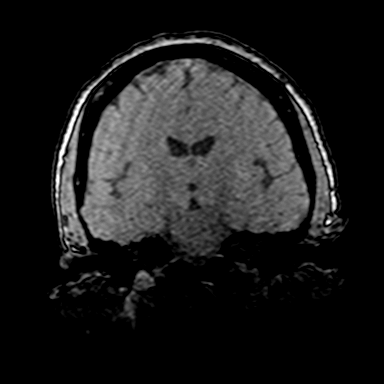
[im 113/208]
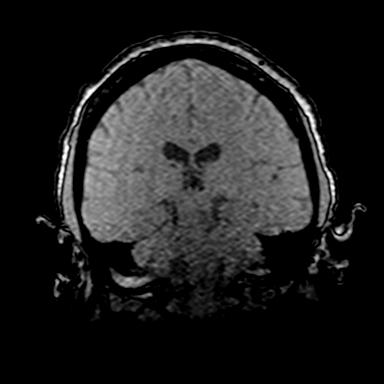
[im 151/208]
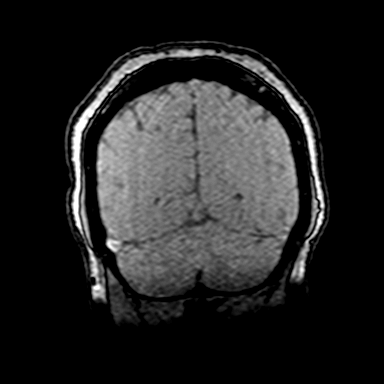
[im 170/208]
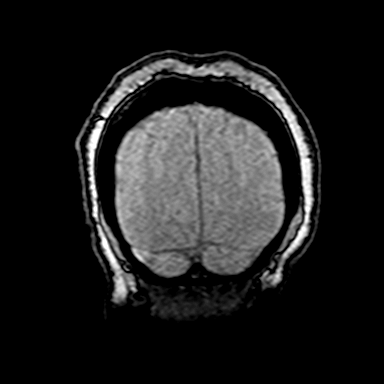
[im 189/208]
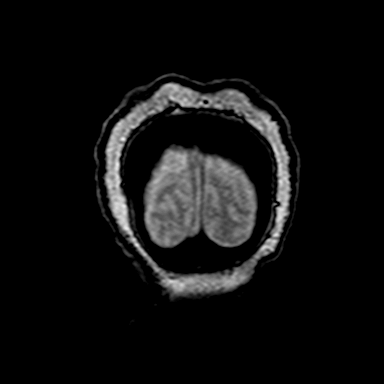
[im 208/208]
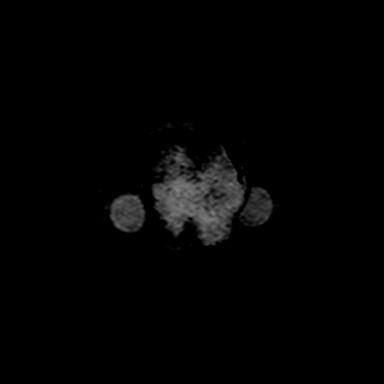

[Series 23: venous inhance coronal_msum · coronal · portal-venous · 0.9mm · 0.57mm/px · 9 of 208 slices shown]
[im 1/208]
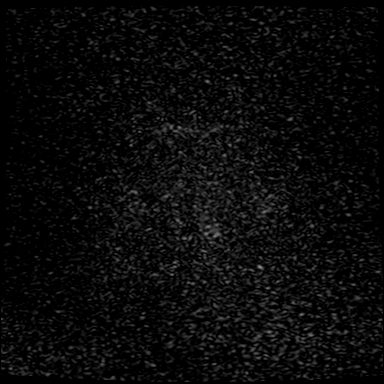
[im 38/208]
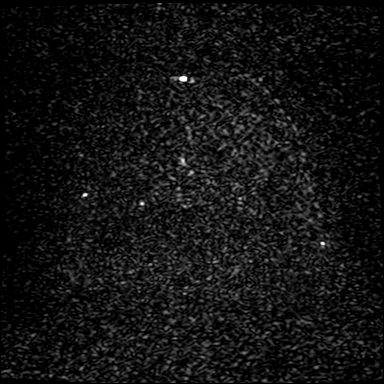
[im 57/208]
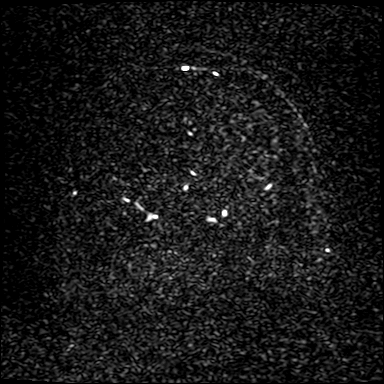
[im 95/208]
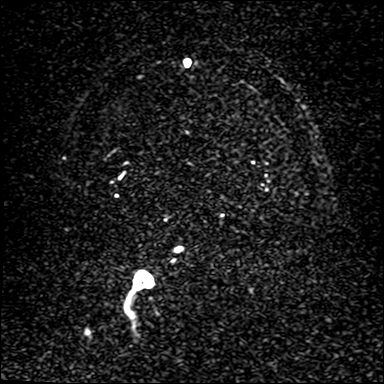
[im 113/208]
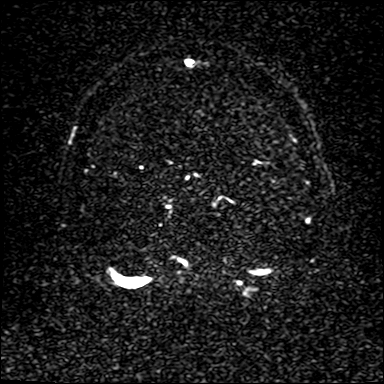
[im 151/208]
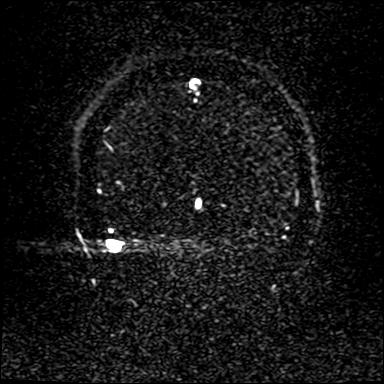
[im 170/208]
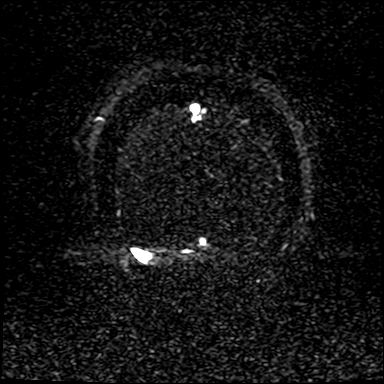
[im 189/208]
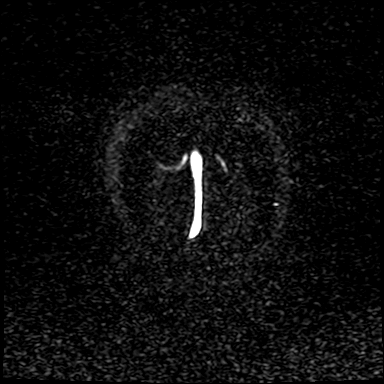
[im 208/208]
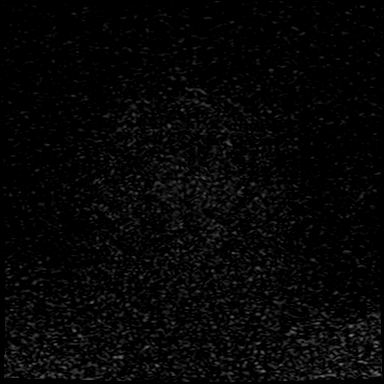

[Series 26: t1_fl3d_sag_p2_iso · sagittal · 1.0mm · 0.98mm/px · 8 of 144 slices shown]
[im 1/144]
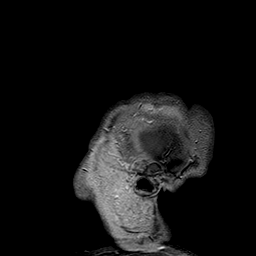
[im 21/144]
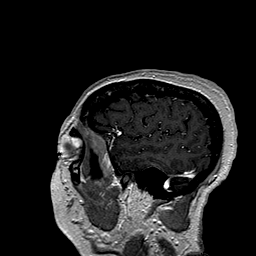
[im 41/144]
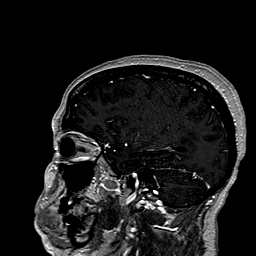
[im 62/144]
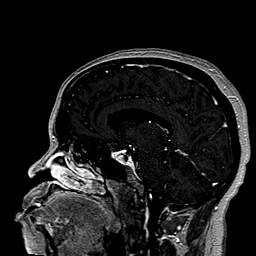
[im 82/144]
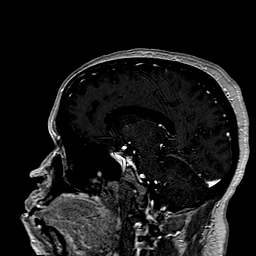
[im 103/144]
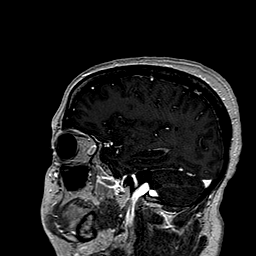
[im 123/144]
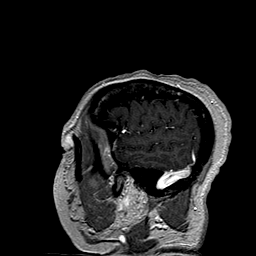
[im 144/144]
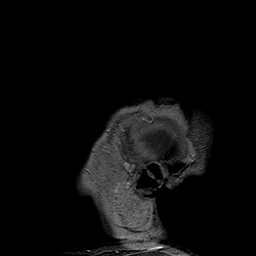

[Series 27: t1_fl3d_sag_p2_iso_mpr_ axial · axial · 2.0mm · 0.45mm/px · z∈[-144,-94]mm · 2 of 79 slices shown]
[im 1/79]
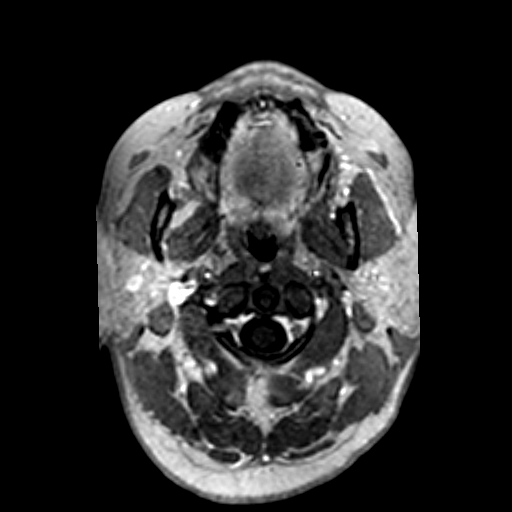
[im 27/79]
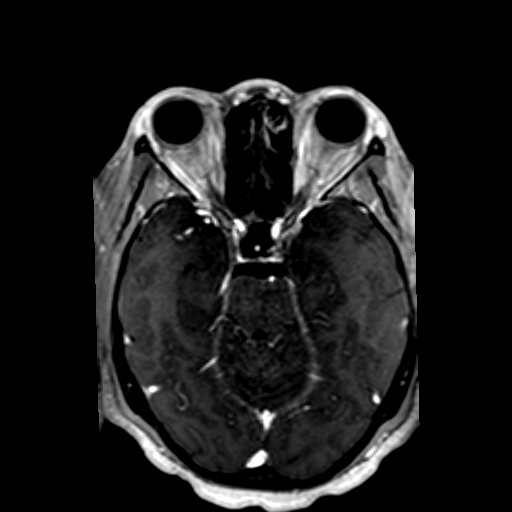

[35 of 48 positions shown; findings below may reference images not displayed]

FINDINGS: MRI HEAD FINDINGS

Brain: Examination mildly degraded by motion.

Mild diffuse prominence of the CSF containing spaces compatible with
generalized cerebral atrophy. Multiple associated dilated
perivascular spaces noted throughout the supratentorial cerebral
white matter. Patchy T2/FLAIR hyperintensity involving the
periventricular and deep white matter both cerebral hemispheres most
consistent with chronic small vessel ischemic disease, mild to
moderate in nature.

No abnormal foci of restricted diffusion to suggest acute or
subacute ischemia. Gray-white matter differentiation maintained. No
encephalomalacia to suggest chronic cortical infarction. No evidence
for acute intracranial hemorrhage.

No mass lesion, midline shift or mass effect. No hydrocephalus or
extra-axial fluid collection. Pituitary gland suprasellar region
within normal limits. Midline structures intact.

Vascular: Major intracranial vascular flow voids are maintained.
Normal flow voids seen within the major dural sinuses.

Skull and upper cervical spine: Craniocervical junction within
normal limits. Diffusely decreased T1 signal intensity seen within
the visualized bone marrow, nonspecific, but most commonly related
to anemia, smoking, or obesity. No focal marrow replacing lesion. No
scalp soft tissue abnormality.

Sinuses/Orbits: Globes and orbital soft tissues within normal
limits. Paranasal sinuses are clear. No mastoid effusion. Inner ear
structures grossly normal.

Other: None.

MRA HEAD FINDINGS

Anterior circulation: Visualized distal cervical segments of the
internal carotid arteries are patent with antegrade flow. Petrous,
cavernous, and supraclinoid segments patent without stenosis or
other abnormality. A1 segments patent bilaterally. Normal anterior
communicating artery complex. Anterior cerebral arteries patent to
their distal aspects without stenosis.

Left M1 segment widely patent. Short-segment mild stenosis noted
involving the mid right M1 segment (series [N5], image 13). Right M1
otherwise patent as well. Normal MCA bifurcations. Distal MCA
branches well perfused and symmetric.

Posterior circulation: Both vertebral arteries patent to the
vertebrobasilar junction without stenosis. Vertebral arteries are
largely code dominant. Both PICA origins patent and normal. Basilar
tortuous but widely patent to its distal aspect without stenosis.
Superior cerebellar arteries patent bilaterally. Both PCAs primarily
supplied via the basilar well perfused to their distal aspects.

Anatomic variants: None significant. No intracranial aneurysm or
other vascular abnormality. She

MRV HEAD FINDINGS

Normal flow related signal seen throughout the superior sagittal
sinus to the torcula. Torcula itself is patent. Transverse and
sigmoid sinuses are patent as are the visualized proximal internal
jugular veins. Right transverse sinus dominant, with a hypoplastic
left transverse sinus. Straight sinus, vein of DG, internal
cerebral veins, and basal veins of DG are patent. No evidence
for dural sinus thrombosis. No appreciable dural sinus stenosis. No
appreciable cortical vein thrombosis. No abnormality about the
cavernous sinus. Superior ophthalmic veins symmetric and within
normal limits.
IMPRESSION: MRI HEAD:

1. No acute intracranial abnormality.
2. Mild to moderate chronic microvascular ischemic disease.

MRA HEAD:

1. Short-segment mild stenosis involving the mid right M1 segment.
2. Otherwise normal intracranial MRA. No large vessel occlusion. No
other hemodynamically significant or correctable stenosis. No
intracranial aneurysm.

MRV HEAD:

Normal intracranial MRV.  No evidence for dural sinus thrombosis.

## 2021-04-16 IMAGING — MR MR HEAD W/O CM
12 of 13 series · 44 of 48 positions shown · IV contrast (gadavist)
Comparison: Prior CT from [DATE].

CLINICAL DATA: Initial evaluation for acute dizziness, headaches
for several days.

EXAM:
MRI HEAD WITHOUT CONTRAST
MRA HEAD WITHOUT CONTRAST
MR VENOGRAM HEAD WITHOUT AND WITH CONTRAST
TECHNIQUE: Multiplanar, multi-echo pulse sequences of the brain and surrounding
structures were acquired without and with intravenous contrast.
Angiographic images of the Circle of Willis were acquired using MRA
technique without intravenous contrast.
CONTRAST:  10mL GADAVIST GADOBUTROL 1 MMOL/ML IV SOLN

[Series 5: DWI · axial · 3.0mm · 0.88mm/px · z∈[-137,+13]mm · 8 of 104 slices shown (1 of 4)]
[im 1/104]
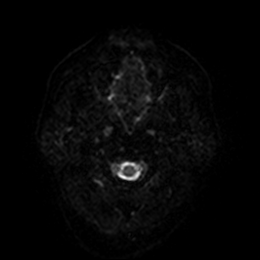
[im 15/104]
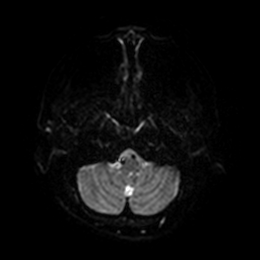
[im 30/104]
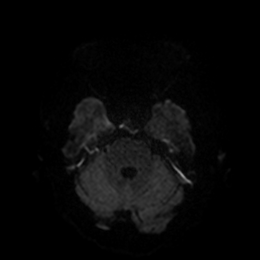
[im 45/104]
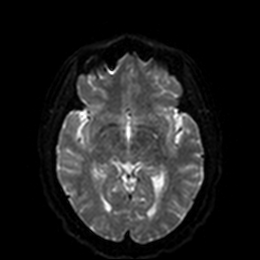
[im 59/104]
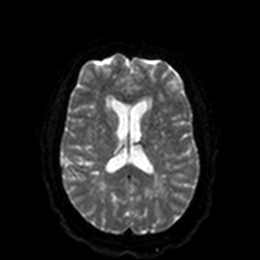
[im 74/104]
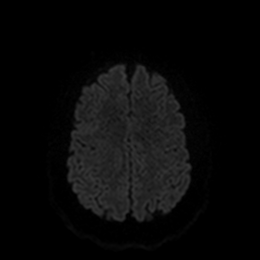
[im 89/104]
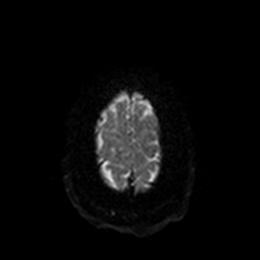
[im 104/104]
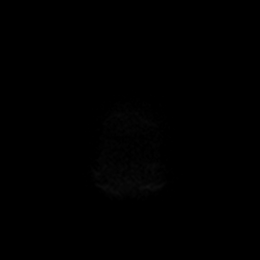

[Series 6: DWI · axial · 3.0mm · 0.88mm/px · z∈[-137,+13]mm · 4 of 52 slices shown (2 of 4)]
[im 1/52]
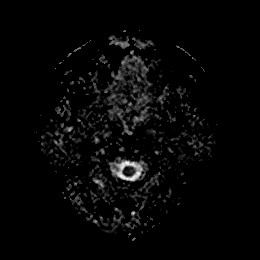
[im 18/52]
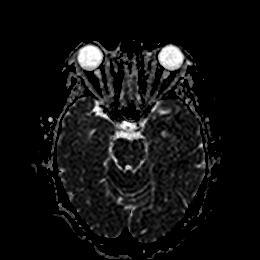
[im 35/52]
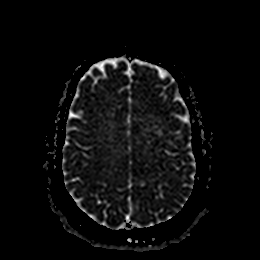
[im 52/52]
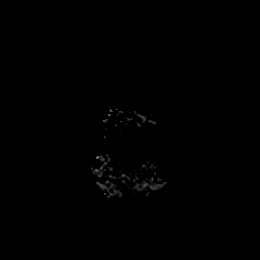

[Series 7: DWI · coronal · 4.0mm · 0.88mm/px · 5 of 72 slices shown (3 of 4)]
[im 1/72]
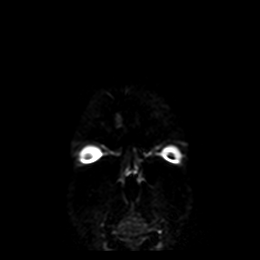
[im 18/72]
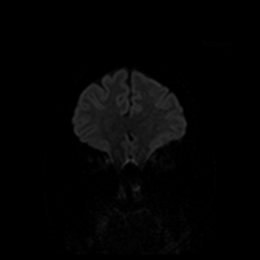
[im 36/72]
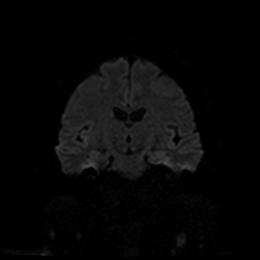
[im 54/72]
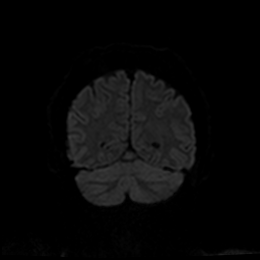
[im 72/72]
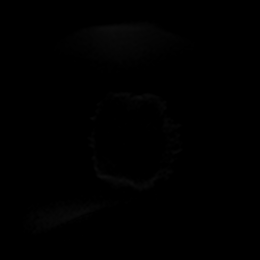

[Series 8: DWI · coronal · 4.0mm · 0.88mm/px · 3 of 36 slices shown (4 of 4)]
[im 1/36]
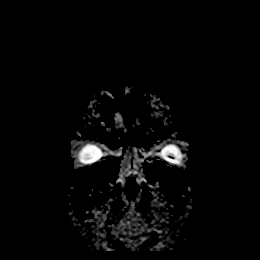
[im 18/36]
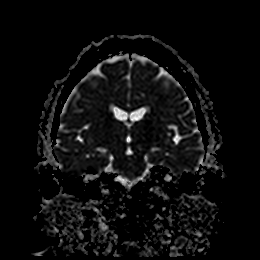
[im 36/36]
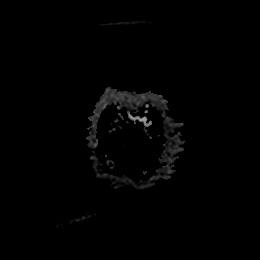

[Series 9: T1 · sagittal · 5.0mm · 0.75mm/px · 2 of 25 slices shown]
[im 1/25]
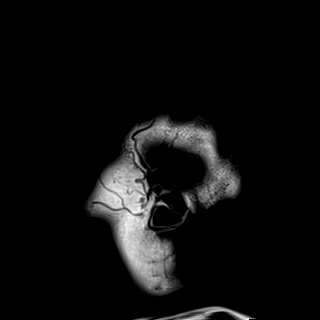
[im 25/25]
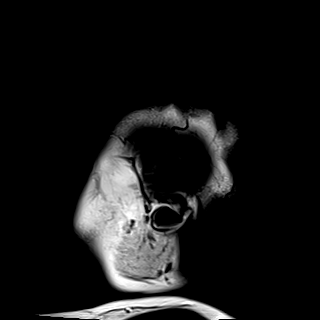

[Series 10: T2 · axial · 5.0mm · 0.72mm/px · z∈[-142,+17]mm · 2 of 28 slices shown (1 of 2)]
[im 1/28]
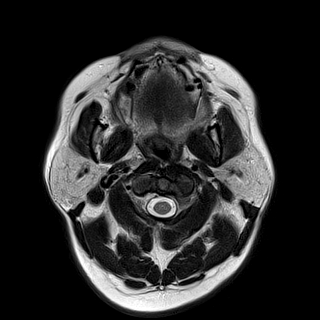
[im 28/28]
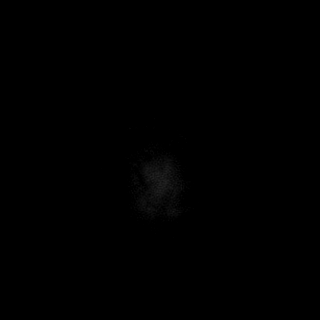

[Series 11: FLAIR · axial · 5.0mm · 0.45mm/px · z∈[-143,+15]mm · 2 of 28 slices shown]
[im 1/28]
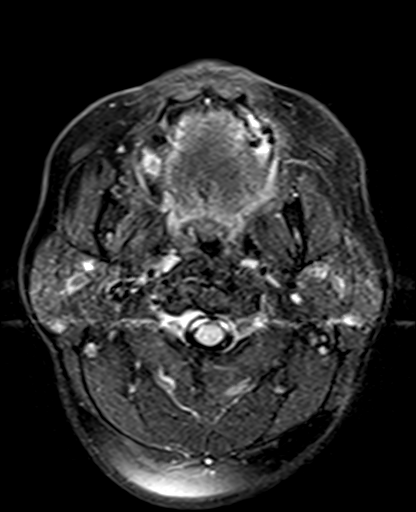
[im 28/28]
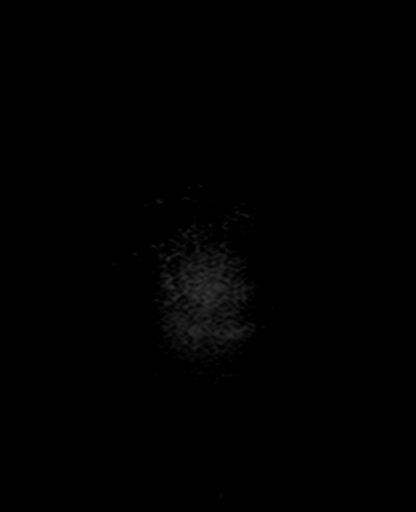

[Series 12: mag_images · axial · 3.0mm · 0.90mm/px · z∈[-150,+23]mm · 4 of 60 slices shown]
[im 1/60]
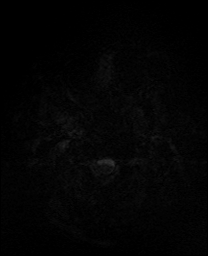
[im 20/60]
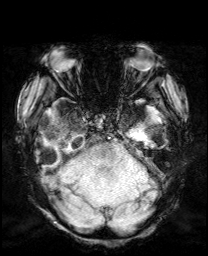
[im 40/60]
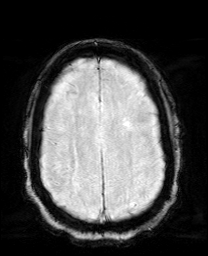
[im 60/60]
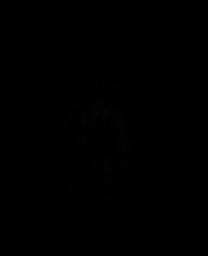

[Series 13: pha_images · axial · 3.0mm · 0.90mm/px · z∈[-150,+23]mm · 4 of 60 slices shown]
[im 1/60]
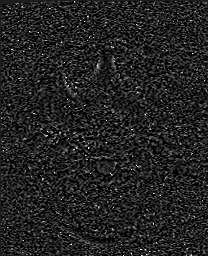
[im 20/60]
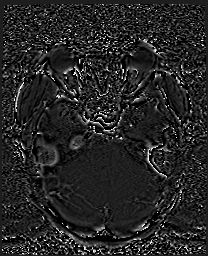
[im 40/60]
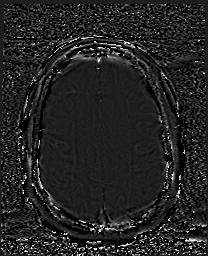
[im 60/60]
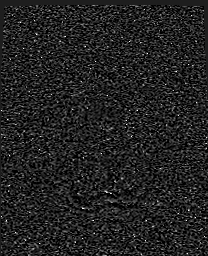

[Series 14: swi_images · axial · 3.0mm · 0.90mm/px · z∈[-150,+23]mm · 4 of 60 slices shown]
[im 1/60]
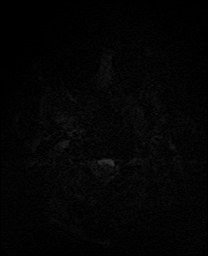
[im 20/60]
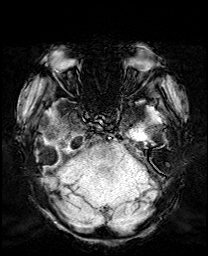
[im 40/60]
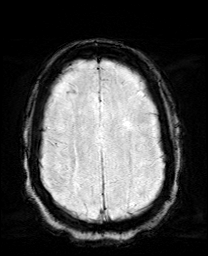
[im 60/60]
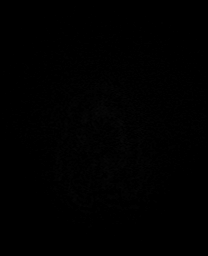

[Series 15: mip_images(sw) · axial · 24.0mm · 0.90mm/px · z∈[-140,+12]mm · 4 of 53 slices shown]
[im 1/53]
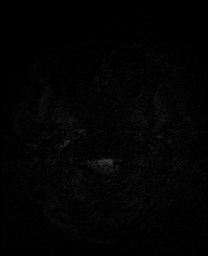
[im 18/53]
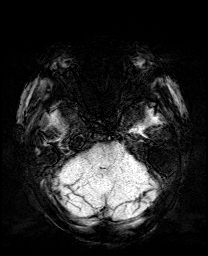
[im 35/53]
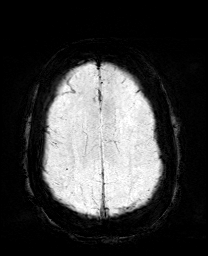
[im 53/53]
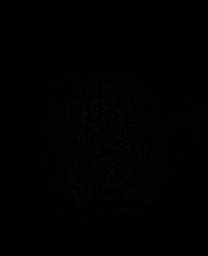

[Series 17: T2 · coronal · 5.0mm · 0.34mm/px · 2 of 31 slices shown (2 of 2)]
[im 1/31]
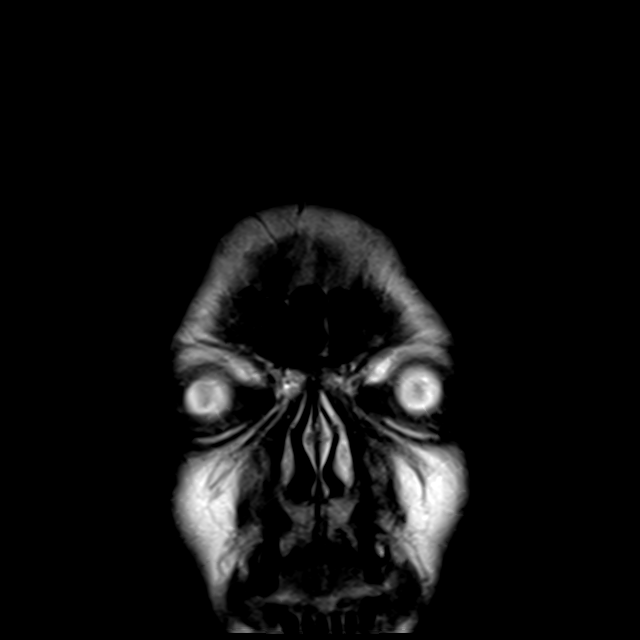
[im 31/31]
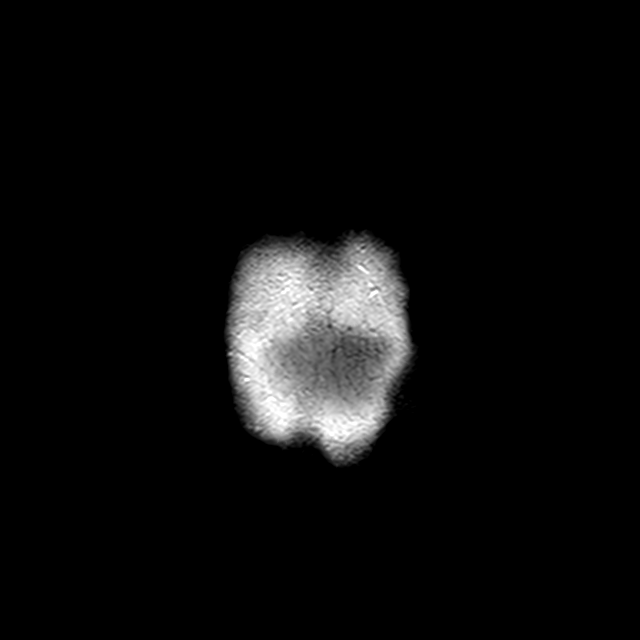

[44 of 48 positions shown; findings below may reference images not displayed]

FINDINGS: MRI HEAD FINDINGS

Brain: Examination mildly degraded by motion.

Mild diffuse prominence of the CSF containing spaces compatible with
generalized cerebral atrophy. Multiple associated dilated
perivascular spaces noted throughout the supratentorial cerebral
white matter. Patchy T2/FLAIR hyperintensity involving the
periventricular and deep white matter both cerebral hemispheres most
consistent with chronic small vessel ischemic disease, mild to
moderate in nature.

No abnormal foci of restricted diffusion to suggest acute or
subacute ischemia. Gray-white matter differentiation maintained. No
encephalomalacia to suggest chronic cortical infarction. No evidence
for acute intracranial hemorrhage.

No mass lesion, midline shift or mass effect. No hydrocephalus or
extra-axial fluid collection. Pituitary gland suprasellar region
within normal limits. Midline structures intact.

Vascular: Major intracranial vascular flow voids are maintained.
Normal flow voids seen within the major dural sinuses.

Skull and upper cervical spine: Craniocervical junction within
normal limits. Diffusely decreased T1 signal intensity seen within
the visualized bone marrow, nonspecific, but most commonly related
to anemia, smoking, or obesity. No focal marrow replacing lesion. No
scalp soft tissue abnormality.

Sinuses/Orbits: Globes and orbital soft tissues within normal
limits. Paranasal sinuses are clear. No mastoid effusion. Inner ear
structures grossly normal.

Other: None.

MRA HEAD FINDINGS

Anterior circulation: Visualized distal cervical segments of the
internal carotid arteries are patent with antegrade flow. Petrous,
cavernous, and supraclinoid segments patent without stenosis or
other abnormality. A1 segments patent bilaterally. Normal anterior
communicating artery complex. Anterior cerebral arteries patent to
their distal aspects without stenosis.

Left M1 segment widely patent. Short-segment mild stenosis noted
involving the mid right M1 segment (series [N5], image 13). Right M1
otherwise patent as well. Normal MCA bifurcations. Distal MCA
branches well perfused and symmetric.

Posterior circulation: Both vertebral arteries patent to the
vertebrobasilar junction without stenosis. Vertebral arteries are
largely code dominant. Both PICA origins patent and normal. Basilar
tortuous but widely patent to its distal aspect without stenosis.
Superior cerebellar arteries patent bilaterally. Both PCAs primarily
supplied via the basilar well perfused to their distal aspects.

Anatomic variants: None significant. No intracranial aneurysm or
other vascular abnormality. She

MRV HEAD FINDINGS

Normal flow related signal seen throughout the superior sagittal
sinus to the torcula. Torcula itself is patent. Transverse and
sigmoid sinuses are patent as are the visualized proximal internal
jugular veins. Right transverse sinus dominant, with a hypoplastic
left transverse sinus. Straight sinus, vein of DG, internal
cerebral veins, and basal veins of DG are patent. No evidence
for dural sinus thrombosis. No appreciable dural sinus stenosis. No
appreciable cortical vein thrombosis. No abnormality about the
cavernous sinus. Superior ophthalmic veins symmetric and within
normal limits.
IMPRESSION: MRI HEAD:

1. No acute intracranial abnormality.
2. Mild to moderate chronic microvascular ischemic disease.

MRA HEAD:

1. Short-segment mild stenosis involving the mid right M1 segment.
2. Otherwise normal intracranial MRA. No large vessel occlusion. No
other hemodynamically significant or correctable stenosis. No
intracranial aneurysm.

MRV HEAD:

Normal intracranial MRV.  No evidence for dural sinus thrombosis.

## 2021-04-16 MED ORDER — GADOBUTROL 1 MMOL/ML IV SOLN
10.0000 mL | Freq: Once | INTRAVENOUS | Status: AC | PRN
  Administered 2021-04-16: 10 mL via INTRAVENOUS

## 2021-04-16 NOTE — ED Notes (Signed)
Pt to MRI

## 2021-04-16 NOTE — ED Notes (Signed)
Pt given update on plan of care. Pt reporting no pain, no dizziness, and no weakness at this time. Pt states "I am just ready to go home". Pt denies any further needs at this time.

## 2021-04-16 NOTE — ED Notes (Signed)
Patient verbalizes understanding of discharge instructions. Opportunity for questioning and answers were provided. Armband removed by staff, pt discharged from ED via wheelchair.  

## 2022-07-19 ENCOUNTER — Ambulatory Visit
Admission: EM | Admit: 2022-07-19 | Discharge: 2022-07-19 | Disposition: A | Discharge status: Home / Self Care | Payer: Self-pay | Attending: Physician Assistant | Admitting: Physician Assistant

## 2022-07-19 ENCOUNTER — Encounter: Payer: Self-pay | Admitting: Physician Assistant

## 2022-07-19 DIAGNOSIS — L723 Sebaceous cyst: Secondary | ICD-10-CM

## 2022-07-19 MED ORDER — DOXYCYCLINE HYCLATE 100 MG PO CAPS: 100.0000 mg | ORAL_CAPSULE | Freq: Two times a day (BID) | ORAL | 0 refills | Status: DC

## 2022-07-19 NOTE — ED Triage Notes (Signed)
Pt presents to [facility] with co of abcess to mid back at bra line. Pt reports it developed about 4 months ago but has been getting worse, painfully swelling and itchiness.

## 2022-07-20 ENCOUNTER — Encounter: Payer: Self-pay | Admitting: Physician Assistant

## 2022-07-20 DIAGNOSIS — L723 Sebaceous cyst: Secondary | ICD-10-CM

## 2022-07-20 NOTE — ED Provider Notes (Signed)
[facility]    CSN: [id number] Arrival date & time: 07/19/22  1636      History   Chief Complaint Chief Complaint  Patient presents with   Cyst    HPI Michele Garcia is a 56 y.o. female.   Patient here today for evaluation of possible abscess to her mid back at her bra line that has been present for 4 months.  She reports that area has slowly increased in size and is now more painful.  She has not had fever.  She denies any other symptoms.  She has not had this occur in the past.  The history is provided by the patient.    Past Medical History:  Diagnosis Date   Hyperlipidemia    Hypertension    Narcolepsy     Patient Active Problem List   Diagnosis Date Noted   Narcolepsy 06/29/2016   Other chest pain 06/04/2015   Morbid obesity (HCC) 06/04/2015   Breast pain, right 11/02/2012    Past Surgical History:  Procedure Laterality Date   ABDOMINAL HYSTERECTOMY     CESAREAN SECTION     x 2   dermoid tumor      OB History     Gravida  4   Para  2   Term  2   Preterm      AB  2   Living  2      SAB      IAB      Ectopic      Multiple      Live Births               Home Medications    Prior to Admission medications   Medication Sig Start Date End Date Taking? Authorizing Provider  doxycycline (VIBRAMYCIN) 100 MG capsule Take 1 capsule (100 mg total) by mouth 2 (two) times daily. 07/19/22  Yes Tomi Bamberger, PA-C  aspirin EC 81 MG tablet Take 81 mg by mouth at bedtime.    [provider]  naproxen (NAPROSYN) 500 MG tablet Take 1 tablet (500 mg total) by mouth 2 (two) times daily. Patient not taking: No sig reported 12/11/20   Lew Dawes, PA-C    Family History Family History  Problem Relation Age of Onset   Cancer Mother 5       breast   Breast cancer Mother    Cancer Maternal Grandmother        breast   Diabetes Maternal Grandmother    Breast cancer Maternal Grandmother    Diabetes Maternal  Grandfather    Stroke Maternal Grandfather    Hypertension Maternal Grandfather     Social History Social History   Tobacco Use   Smoking status: Never   Smokeless tobacco: Never  Substance Use Topics   Alcohol use: No   Drug use: No     Allergies   Patient has no known allergies.   Review of Systems Review of Systems  Constitutional:  Negative for chills and fever.  Eyes:  Negative for discharge and redness.  Gastrointestinal:  Negative for nausea and vomiting.  Skin:  Positive for color change. Negative for wound.     Physical Exam Triage Vital Signs ED Triage Vitals  Enc Vitals Group     BP 07/19/22 1735 (!) 137/90     Pulse Rate 07/19/22 1735 79     Resp 07/19/22 1735 18     Temp 07/19/22 1735 98.4 F (36.9 C)  Temp src --      SpO2 07/19/22 1735 98 %     Weight --      Height --      Head Circumference --      Peak Flow --      Pain Score 07/19/22 1733 6     Pain Loc --      Pain Edu? --      Excl. in GC? --    No data found.  Updated Vital Signs BP (!) 137/90   Pulse 79   Temp 98.4 F (36.9 C)   Resp 18   LMP 12/18/2011   SpO2 98%     Physical Exam Vitals and nursing note reviewed.  Constitutional:      General: She is not in acute distress.    Appearance: Normal appearance. She is not ill-appearing.  HENT:     Head: Normocephalic and atraumatic.  Eyes:     Conjunctiva/sclera: Conjunctivae normal.  Cardiovascular:     Rate and Rhythm: Normal rate.  Pulmonary:     Effort: Pulmonary effort is normal.  Skin:    Comments: Approximately 3 cm area of induration to the left mid back just lateral to midline.  Mild erythema and tenderness to palpation noted.  Neurological:     Mental Status: She is alert.  Psychiatric:        Mood and Affect: Mood normal.        Behavior: Behavior normal.        Thought Content: Thought content normal.      UC Treatments / Results  Labs (all labs ordered are listed, but only abnormal results are  displayed) Labs Reviewed - No data to display  EKG   Radiology No results found.  Procedures Incision and Drainage  Date/Time: 07/20/2022 9:06 AM  Performed by: Tomi Bamberger, PA-C Authorized by: Tomi Bamberger, PA-C   Consent:    Consent obtained:  Verbal   Consent given by:  Patient   Risks discussed:  Bleeding, incomplete drainage and pain   Alternatives discussed:  No treatment and alternative treatment Universal protocol:    Procedure explained and questions answered to patient or proxy's satisfaction: yes     Relevant documents present and verified: yes     Required blood products, implants, devices, and special equipment available: yes     Patient identity confirmed:  Provided demographic data Location:    Type:  Cyst   Location:  Trunk   Trunk location:  Back Pre-procedure details:    Skin preparation:  Betadine Sedation:    Sedation type:  None Anesthesia:    Anesthesia method:  Local infiltration   Local anesthetic:  Lidocaine 1% WITH epi and lidocaine 2% WITH epi (1 cc) Procedure type:    Complexity:  Simple Procedure details:    Incision types:  Single straight and cruciate   Incision depth:  Subcutaneous   Drainage:  Purulent (thick, clumpy)   Drainage amount:  Moderate   Packing materials:  None Post-procedure details:    Procedure completion:  Tolerated well, no immediate complications  (including critical care time)  Medications Ordered in UC Medications - No data to display  Initial Impression / Assessment and Plan / UC Course  I have reviewed the triage vital signs and the nursing notes.  Pertinent labs & imaging results that were available during my care of the patient were reviewed by me and considered in my medical decision making (see chart for details).  Given contents expressed with incision and drainage suspect sebaceous cyst rather than abscess.  Will treat with doxycycline to cover possible infection and encouraged follow-up  if no continued gradual improvement or with any further concerns.   Final Clinical Impressions(s) / UC Diagnoses   Final diagnoses:  Sebaceous cyst   Discharge Instructions   None    ED Prescriptions     Medication Sig Dispense Auth. Provider   doxycycline (VIBRAMYCIN) 100 MG capsule Take 1 capsule (100 mg total) by mouth 2 (two) times daily. 20 capsule Francene Finders, PA-C      PDMP not reviewed this encounter.   Francene Finders, PA-C 07/20/22 [phone]

## 2023-07-14 ENCOUNTER — Ambulatory Visit
Admission: EM | Admit: 2023-07-14 | Discharge: 2023-07-14 | Disposition: A | Discharge status: Home / Self Care | Payer: BLUE CROSS/BLUE SHIELD | Attending: Internal Medicine | Admitting: Internal Medicine

## 2023-07-14 DIAGNOSIS — J069 Acute upper respiratory infection, unspecified: Secondary | ICD-10-CM | POA: Diagnosis present

## 2023-07-14 DIAGNOSIS — Z1152 Encounter for screening for COVID-19: Secondary | ICD-10-CM | POA: Diagnosis present

## 2023-07-14 DIAGNOSIS — J029 Acute pharyngitis, unspecified: Secondary | ICD-10-CM

## 2023-07-14 LAB — POCT RAPID STREP A (OFFICE): Rapid Strep A Screen: NEGATIVE

## 2023-07-14 MED ORDER — PREDNISONE 20 MG PO TABS: 40.0000 mg | ORAL_TABLET | Freq: Every day | ORAL | 0 refills | Status: AC

## 2023-07-14 MED ORDER — ALBUTEROL SULFATE HFA 108 (90 BASE) MCG/ACT IN AERS: 1.0000 | INHALATION_SPRAY | Freq: Four times a day (QID) | RESPIRATORY_TRACT | 0 refills | Status: DC | PRN

## 2023-07-14 MED ORDER — BENZONATATE 100 MG PO CAPS: 100.0000 mg | ORAL_CAPSULE | Freq: Three times a day (TID) | ORAL | 0 refills | Status: DC | PRN

## 2023-07-14 NOTE — ED Triage Notes (Signed)
Patient presents to [facility] for sore throat, wheezing, chills, and nasal congestion x Tuesday. Treating symptoms with mucinex and vitamin C.

## 2023-07-14 NOTE — Discharge Instructions (Signed)
Strep is negative.  Throat culture and COVID test pending.  As we discussed, this appears to be viral in nature and should run its course.  I have prescribed a few different medications to alleviate symptoms.  Follow-up if any symptoms persist or worsen.

## 2023-07-14 NOTE — ED Provider Notes (Signed)
[name]    CSN: [id number] Arrival date & time: 07/14/23  1537      History   Chief Complaint Chief Complaint  Patient presents with   Sore Throat   Nasal Congestion    HPI Michele Garcia is a 57 y.o. female.   Patient presents with 4-day history of sore throat, wheezing, chills, nasal congestion, coughing.  Denies any known sick contacts or fever at home.  Has been taking Mucinex for symptoms with minimal improvement.  Denies history of asthma and patient does not smoke cigarettes.  Denies chest pain or shortness of breath.   Sore Throat    Past Medical History:  Diagnosis Date   Hyperlipidemia    Hypertension    Narcolepsy     Patient Active Problem List   Diagnosis Date Noted   Narcolepsy 06/29/2016   Other chest pain 06/04/2015   Morbid obesity (HCC) 06/04/2015   Breast pain, right 11/02/2012    Past Surgical History:  Procedure Laterality Date   ABDOMINAL HYSTERECTOMY     CESAREAN SECTION     x 2   dermoid tumor      OB History     Gravida  4   Para  2   Term  2   Preterm      AB  2   Living  2      SAB      IAB      Ectopic      Multiple      Live Births               Home Medications    Prior to Admission medications   Medication Sig Start Date End Date Taking? Authorizing Provider  albuterol (VENTOLIN HFA) 108 (90 Base) MCG/ACT inhaler Inhale 1-2 puffs into the lungs every 6 (six) hours as needed for wheezing or shortness of breath. 07/14/23  Yes Ninette Cotta, Rolly Salter E, FNP  benzonatate (TESSALON) 100 MG capsule Take 1 capsule (100 mg total) by mouth every 8 (eight) hours as needed for cough. 07/14/23  Yes Jafet Wissing, Rolly Salter E, FNP  predniSONE (DELTASONE) 20 MG tablet Take 2 tablets (40 mg total) by mouth daily for 5 days. 07/14/23 07/19/23 Yes Gustavus Bryant, FNP  aspirin EC 81 MG tablet Take 81 mg by mouth at bedtime.    [provider]  doxycycline (VIBRAMYCIN) 100 MG capsule Take 1 capsule (100 mg  total) by mouth 2 (two) times daily. 07/19/22   Tomi Bamberger, PA-C  naproxen (NAPROSYN) 500 MG tablet Take 1 tablet (500 mg total) by mouth 2 (two) times daily. Patient not taking: Reported on 04/15/2021 12/11/20   Lew Dawes, PA-C    Family History Family History  Problem Relation Age of Onset   Cancer Mother 87       breast   Breast cancer Mother    Cancer Maternal Grandmother        breast   Diabetes Maternal Grandmother    Breast cancer Maternal Grandmother    Diabetes Maternal Grandfather    Stroke Maternal Grandfather    Hypertension Maternal Grandfather     Social History Social History   Tobacco Use   Smoking status: Never   Smokeless tobacco: Never  Substance Use Topics   Alcohol use: No   Drug use: No     Allergies   Patient has no known allergies.   Review of Systems Review of Systems Per HPI  Physical Exam Triage Vital  Signs ED Triage Vitals [07/14/23 1658]  Encounter Vitals Group     BP (!) 153/95     Systolic BP Percentile      Diastolic BP Percentile      Pulse Rate 90     Resp 16     Temp 98.6 F (37 C)     Temp Source Oral     SpO2 97 %     Weight      Height      Head Circumference      Peak Flow      Pain Score      Pain Loc      Pain Education      Exclude from Growth Chart    No data found.  Updated Vital Signs BP (!) 153/95 (BP Location: Left Arm)   Pulse 90   Temp 98.6 F (37 C) (Oral)   Resp 16   LMP 12/18/2011   SpO2 97%   Visual Acuity Right Eye Distance:   Left Eye Distance:   Bilateral Distance:    Right Eye Near:   Left Eye Near:    Bilateral Near:     Physical Exam Constitutional:      General: She is not in acute distress.    Appearance: Normal appearance. She is not toxic-appearing or diaphoretic.  HENT:     Head: Normocephalic and atraumatic.     Right Ear: Ear canal normal. No drainage, swelling or tenderness. A middle ear effusion is present. Tympanic membrane is not perforated,  erythematous or bulging.     Left Ear: Ear canal normal. No drainage, swelling or tenderness. A middle ear effusion is present. Tympanic membrane is not perforated, erythematous or bulging.     Nose: Congestion present.     Mouth/Throat:     Mouth: Mucous membranes are moist.     Pharynx: Posterior oropharyngeal erythema present.  Eyes:     Extraocular Movements: Extraocular movements intact.     Conjunctiva/sclera: Conjunctivae normal.     Pupils: Pupils are equal, round, and reactive to light.  Cardiovascular:     Rate and Rhythm: Normal rate and regular rhythm.     Pulses: Normal pulses.     Heart sounds: Normal heart sounds.  Pulmonary:     Effort: Pulmonary effort is normal. No respiratory distress.     Breath sounds: Normal breath sounds. No wheezing.  Abdominal:     General: Abdomen is flat. Bowel sounds are normal.     Palpations: Abdomen is soft.  Musculoskeletal:        General: Normal range of motion.     Cervical back: Normal range of motion.  Skin:    General: Skin is warm and dry.  Neurological:     General: No focal deficit present.     Mental Status: She is alert and oriented to person, place, and time. Mental status is at baseline.  Psychiatric:        Mood and Affect: Mood normal.        Behavior: Behavior normal.      UC Treatments / Results  Labs (all labs ordered are listed, but only abnormal results are displayed) Labs Reviewed  SARS CORONAVIRUS 2 (TAT 6-24 HRS)  CULTURE, GROUP A STREP [facility])  POCT RAPID STREP A (OFFICE)    EKG   Radiology No results found.  Procedures Procedures (including critical care time)  Medications Ordered in UC Medications - No data to display  Initial Impression / Assessment  and Plan / UC Course  I have reviewed the triage vital signs and the nursing notes.  Pertinent labs & imaging results that were available during my care of the patient were reviewed by me and considered in my medical decision making (see  chart for details).     Patient presents with symptoms likely from a viral upper respiratory infection.  Do not suspect underlying cardiopulmonary process. Symptoms seem unlikely related to ACS, CHF or COPD exacerbations, pneumonia, pneumothorax. Patient is nontoxic appearing and not in need of emergent medical intervention.  Possibly viral bronchitis.  Rapid strep negative.  Throat culture and COVID test pending.  No adventitious lung sounds on exam so do not think that chest imaging is necessary.  Recommended symptom control with medications and supportive care.  Patient was sent prednisone, albuterol inhaler, benzonatate for cough.  Patient has taken steroids previously and tolerated well with no contraindication to steroid therapy noted in patient's history.  Return if symptoms fail to improve. Patient states understanding and is agreeable.  Discharged with PCP followup.  Final Clinical Impressions(s) / UC Diagnoses   Final diagnoses:  Encounter for screening for COVID-19  Viral upper respiratory tract infection with cough  Sore throat     Discharge Instructions      Strep is negative.  Throat culture and COVID test pending.  As we discussed, this appears to be viral in nature and should run its course.  I have prescribed a few different medications to alleviate symptoms.  Follow-up if any symptoms persist or worsen.     ED Prescriptions     Medication Sig Dispense Auth. Provider   predniSONE (DELTASONE) 20 MG tablet Take 2 tablets (40 mg total) by mouth daily for 5 days. 10 tablet [place], [place] E, [place]   benzonatate (TESSALON) 100 MG capsule Take 1 capsule (100 mg total) by mouth every 8 (eight) hours as needed for cough. 21 capsule [place], [place] E, [place]   albuterol (VENTOLIN HFA) 108 (90 Base) MCG/ACT inhaler Inhale 1-2 puffs into the lungs every 6 (six) hours as needed for wheezing or shortness of breath. 1 each Gustavus Bryant, [place]      PDMP not reviewed this encounter.    Gustavus Bryant, [place] 07/14/23 [phone]

## 2023-07-15 LAB — SARS CORONAVIRUS 2 (TAT 6-24 HRS): SARS Coronavirus 2: NEGATIVE

## 2023-07-17 LAB — CULTURE, GROUP A STREP (THRC)

## 2023-12-19 ENCOUNTER — Other Ambulatory Visit: Payer: Self-pay

## 2023-12-19 ENCOUNTER — Emergency Department (HOSPITAL_COMMUNITY)
Admission: EM | Admit: 2023-12-19 | Discharge: 2023-12-19 | Disposition: A | Discharge status: Home / Self Care | Payer: BLUE CROSS/BLUE SHIELD | Attending: Emergency Medicine | Admitting: Emergency Medicine

## 2023-12-19 ENCOUNTER — Emergency Department (HOSPITAL_COMMUNITY): Payer: BLUE CROSS/BLUE SHIELD

## 2023-12-19 ENCOUNTER — Encounter (HOSPITAL_COMMUNITY): Payer: Self-pay

## 2023-12-19 DIAGNOSIS — I10 Essential (primary) hypertension: Secondary | ICD-10-CM | POA: Diagnosis not present

## 2023-12-19 DIAGNOSIS — Z7982 Long term (current) use of aspirin: Secondary | ICD-10-CM | POA: Insufficient documentation

## 2023-12-19 DIAGNOSIS — R55 Syncope and collapse: Secondary | ICD-10-CM | POA: Diagnosis not present

## 2023-12-19 DIAGNOSIS — R42 Dizziness and giddiness: Secondary | ICD-10-CM | POA: Diagnosis present

## 2023-12-19 LAB — COMPREHENSIVE METABOLIC PANEL
ALT: 18 U/L (ref 0–44)
AST: 20 U/L (ref 15–41)
Albumin: 3.6 g/dL (ref 3.5–5.0)
Alkaline Phosphatase: 84 U/L (ref 38–126)
Anion gap: 10 (ref 5–15)
BUN: 11 mg/dL (ref 6–20)
CO2: 25 mmol/L (ref 22–32)
Calcium: 9.3 mg/dL (ref 8.9–10.3)
Chloride: 99 mmol/L (ref 98–111)
Creatinine, Ser: 0.99 mg/dL (ref 0.44–1.00)
GFR, Estimated: >60 mL/min (ref >60)
Glucose, Bld: 110 mg/dL — ABNORMAL HIGH (ref 70–99)
Potassium: 3.8 mmol/L (ref 3.5–5.1)
Sodium: 134 mmol/L — ABNORMAL LOW (ref 135–145)
Total Bilirubin: 0.6 mg/dL (ref 0.0–1.2)
Total Protein: 7 g/dL (ref 6.5–8.1)

## 2023-12-19 LAB — CBC
HCT: 39.1 % (ref 36.0–46.0)
Hemoglobin: 12.6 g/dL (ref 12.0–15.0)
MCH: 28 pg (ref 26.0–34.0)
MCHC: 32.2 g/dL (ref 30.0–36.0)
MCV: 86.9 fL (ref 80.0–100.0)
Platelets: 244 10*3/uL (ref 150–400)
RBC: 4.5 MIL/uL (ref 3.87–5.11)
RDW: 12.3 % (ref 11.5–15.5)
WBC: 7.6 10*3/uL (ref 4.0–10.5)
nRBC: 0 % (ref 0.0–0.2)

## 2023-12-19 LAB — MAGNESIUM: Magnesium: 1.7 mg/dL (ref 1.7–2.4)

## 2023-12-19 LAB — HCG, SERUM, QUALITATIVE: Preg, Serum: NEGATIVE

## 2023-12-19 MED ORDER — SODIUM CHLORIDE 0.9 % IV BOLUS
1000.0000 mL | Freq: Once | INTRAVENOUS | Status: AC
  Administered 2023-12-19: 1000 mL via INTRAVENOUS

## 2023-12-19 NOTE — Discharge Instructions (Addendum)
 We believe you had a near syncope, you were given fluids here  Your labs were all normal  Recommend increasing fluid intake and following up with your PCP in next few days to consider further workup of your symptoms. Your do have some PVC's (premature heartbeats, these PVCs come and go and usually do not require treatment) on cardiac monitoring - recommend following up with your PCP for consideration for an echocardiogram (ultrasound of your heart) or cardiology referral

## 2023-12-19 NOTE — ED Provider Notes (Signed)
 [place] EMERGENCY DEPARTMENT AT [facility] Provider Note   CSN: [id number] Arrival date & time: 12/19/23  1328     History  Chief Complaint  Patient presents with   Dizziness   Nausea    Michele Garcia is a 58 y.o. female PMH narcolepsy, HLD, HTN, obesity  HPI  Was at work sitting at desk, smelled something strong and looked up. She said after that she felt "dizzy" but more so lightheaded not spinning of the room. She denies any chest pain, palpitations or vomiting but did feel slightly nauseous. Stated it lasted for 1-2 hours and by the time EMS came and she layed down on the stretcher she felt resolved with her symptoms. No LOC/syncopal.  States she has been really thirsty and she doesn't drink any water all day.  No fevers/sick symptoms (no diarrhea) but does have a cough for the past month after a cold last month.   Denies any CVA hx but says 10 years ago after starting ritalin she had right sided weakness and was told she possibly had a TIA.   States 10 years ago stated she had some "skipped beats" but denies any palpitations during this event-maybe had some last week      Home Medications Prior to Admission medications   Medication Sig Start Date End Date Taking? Authorizing Provider  albuterol (VENTOLIN HFA) 108 (90 Base) MCG/ACT inhaler Inhale 1-2 puffs into the lungs every 6 (six) hours as needed for wheezing or shortness of breath. 07/14/23   Gustavus Bryant, FNP  aspirin EC 81 MG tablet Take 81 mg by mouth at bedtime.    [provider]  benzonatate (TESSALON) 100 MG capsule Take 1 capsule (100 mg total) by mouth every 8 (eight) hours as needed for cough. 07/14/23   Gustavus Bryant, FNP  doxycycline (VIBRAMYCIN) 100 MG capsule Take 1 capsule (100 mg total) by mouth 2 (two) times daily. 07/19/22   Tomi Bamberger, PA-C  naproxen (NAPROSYN) 500 MG tablet Take 1 tablet (500 mg total) by mouth 2 (two) times daily. Patient not taking: Reported  on 04/15/2021 12/11/20   Patterson Hammersmith C, PA-C      Allergies    Patient has no known allergies.    Review of Systems   Review of Systems  Constitutional:  Negative for chills and fever.  HENT:  Negative for ear pain and sore throat.   Eyes:  Negative for pain and visual disturbance.  Respiratory:  Negative for cough and shortness of breath.   Cardiovascular:  Negative for chest pain and palpitations.  Gastrointestinal:  Negative for abdominal pain and vomiting.  Genitourinary:  Negative for dysuria and hematuria.  Musculoskeletal:  Negative for arthralgias and back pain.  Skin:  Negative for color change and rash.  Neurological:  Positive for light-headedness. Negative for seizures and syncope.  All other systems reviewed and are negative.   Physical Exam Updated Vital Signs BP (!) 168/105   Pulse 92   Temp 97.7 F (36.5 C) (Oral)   Resp (!) 21   Ht 5\' 5"  (1.651 m)   Wt 122.5 kg   LMP 12/18/2011   SpO2 94%   BMI 44.93 kg/m  Physical Exam Vitals and nursing note reviewed.  Constitutional:      General: She is not in acute distress.    Appearance: She is well-developed.  HENT:     Head: Normocephalic and atraumatic.  Eyes:     Conjunctiva/sclera: Conjunctivae normal.  Cardiovascular:     Rate and Rhythm: Normal rate and regular rhythm.     Heart sounds: No murmur heard. Pulmonary:     Effort: Pulmonary effort is normal. No respiratory distress.     Breath sounds: Normal breath sounds.  Abdominal:     Palpations: Abdomen is soft.     Tenderness: There is no abdominal tenderness.  Musculoskeletal:        General: No swelling.     Cervical back: Neck supple.  Skin:    General: Skin is warm and dry.     Capillary Refill: Capillary refill takes less than 2 seconds.  Neurological:     General: No focal deficit present.     Mental Status: She is alert and oriented to person, place, and time. Mental status is at baseline.     Cranial Nerves: No cranial nerve  deficit.     Sensory: No sensory deficit.     Motor: No weakness.  Psychiatric:        Mood and Affect: Mood normal.     ED Results / Procedures / Treatments   Labs (all labs ordered are listed, but only abnormal results are displayed) Labs Reviewed  COMPREHENSIVE METABOLIC PANEL - Abnormal; Notable for the following components:      Result Value   Sodium 134 (*)    Glucose, Bld 110 (*)    All other components within normal limits  CBC  MAGNESIUM  HCG, SERUM, QUALITATIVE    EKG EKG Interpretation Date/Time:  Tuesday December 19 2023 13:42:05 EST Ventricular Rate:  83 PR Interval:  149 QRS Duration:  88 QT Interval:  365 QTC Calculation: 429 R Axis:   17  Text Interpretation: Sinus rhythm frequent pvc Otherwise no significant change Confirmed by Melene Plan [phone]) on 12/19/2023 1:58:55 PM  Radiology No results found.  Procedures Procedures   Medications Ordered in ED Medications  sodium chloride 0.9 % bolus 1,000 mL (1,000 mLs Intravenous New Bag/Given 12/19/23 1425)    ED Course/ Medical Decision Making/ A&P                                Medical Decision Making  Medical Decision Making:   Michele Garcia is a 58 y.o. female who presented to the ED today with sudden onset lightheadedness and nausea detailed above.    Patient's presentation is complicated by their history of hx of PVCs, hx ?TIA.  Patient placed on continuous vitals and telemetry monitoring while in ED which was reviewed periodically.  Complete initial physical exam performed, notably the patient  was no neurologic deficits.    Reviewed and confirmed nursing documentation for past medical history, family history, social history.    Initial Assessment:   With the patient's presentation of acute lightheadedness and nausea, most likely diagnosis is vasovagal presyncopal symptoms. Other diagnoses were considered including (but not limited to) TIA, arrhythmia, intrathoracic pathology. These are  considered less likely due to history of present illness and physical exam findings.    Initial Plan:  1 L NS  Screening labs including CBC and Metabolic panel to evaluate for infectious or metabolic etiology of disease hcg CXR to evaluate for structural/infectious intrathoracic pathology.  EKG to evaluate for cardiac pathology Objective evaluation as below reviewed   Initial Study Results:   Laboratory  All laboratory results reviewed without evidence of clinically relevant pathology.   Na 134  EKG EKG was reviewed  independently. Rate, rhythm, axis, intervals all examined and without medically relevant abnormality. ST segments without concerns for elevations. Does have frequent PVCs  Radiology:  My preliminary read of CXR-no obvious infiltrates or consolidation, mild perihilar inflammation No results found.  Final Assessment and Plan:   58 YO F here for acute onset lightheadedness and nausea likely d/t presyncope from vasovagal syncope. Frequent PVCs on EKG, telemetry with frequent PVCs. CBC without anemia or leukocytosis. Awaiting CMP, Mag, bhcg. Given fluids in ED, discussed with patient who is asymptomatic and ambulating well without symptoms. Discussed preliminary read of CXR without obvious pathology. Recommended close follow up with PCP for consideration of echocardiogram +/- cardiology referral due to frequent PVCs. Discussed increasing fluid intake.   Clinical Impression:  1. Near syncope      Data Unavailable   Final Clinical Impression(s) / ED Diagnoses Final diagnoses:  Near syncope    Rx / DC Orders ED Discharge Orders     None         Levin Erp, MD 12/19/23 1522    Melene Plan, DO 12/19/23 1530

## 2023-12-19 NOTE — ED Triage Notes (Signed)
 Pt BIB [facility] from work d/t sudden onset dizziness/nausea 1 prior to arrival. EMS reports 12L was unremarkable then she had runs of Bigeminy & non-perfusing PVCs. Radials on scene was hard to palpate, they can be palpated upon arrival.  Her BP was difficult to obtain as well then they were able to obtain 210/100, does have 20g Lt AC PIV.

## 2024-03-15 ENCOUNTER — Other Ambulatory Visit: Payer: Self-pay

## 2024-03-15 ENCOUNTER — Emergency Department (HOSPITAL_BASED_OUTPATIENT_CLINIC_OR_DEPARTMENT_OTHER)
Admission: EM | Admit: 2024-03-15 | Discharge: 2024-03-15 | Disposition: A | Discharge status: Home / Self Care | Payer: Self-pay | Attending: Emergency Medicine | Admitting: Emergency Medicine

## 2024-03-15 ENCOUNTER — Encounter (HOSPITAL_BASED_OUTPATIENT_CLINIC_OR_DEPARTMENT_OTHER): Payer: Self-pay | Admitting: Emergency Medicine

## 2024-03-15 ENCOUNTER — Emergency Department (HOSPITAL_BASED_OUTPATIENT_CLINIC_OR_DEPARTMENT_OTHER): Payer: Self-pay

## 2024-03-15 DIAGNOSIS — Z7982 Long term (current) use of aspirin: Secondary | ICD-10-CM | POA: Insufficient documentation

## 2024-03-15 DIAGNOSIS — K436 Other and unspecified ventral hernia with obstruction, without gangrene: Secondary | ICD-10-CM

## 2024-03-15 DIAGNOSIS — R1033 Periumbilical pain: Secondary | ICD-10-CM

## 2024-03-15 DIAGNOSIS — R112 Nausea with vomiting, unspecified: Secondary | ICD-10-CM

## 2024-03-15 DIAGNOSIS — D72829 Elevated white blood cell count, unspecified: Secondary | ICD-10-CM | POA: Insufficient documentation

## 2024-03-15 LAB — CBC
HCT: 44.5 % (ref 36.0–46.0)
Hemoglobin: 14.3 g/dL (ref 12.0–15.0)
MCH: 27.4 pg (ref 26.0–34.0)
MCHC: 32.1 g/dL (ref 30.0–36.0)
MCV: 85.2 fL (ref 80.0–100.0)
Platelets: 293 10*3/uL (ref 150–400)
RBC: 5.22 MIL/uL — ABNORMAL HIGH (ref 3.87–5.11)
RDW: 12.3 % (ref 11.5–15.5)
WBC: 14 10*3/uL — ABNORMAL HIGH (ref 4.0–10.5)
nRBC: 0 % (ref 0.0–0.2)

## 2024-03-15 LAB — URINALYSIS, ROUTINE W REFLEX MICROSCOPIC
Bacteria, UA: NONE SEEN
Bilirubin Urine: NEGATIVE
Glucose, UA: NEGATIVE mg/dL
Hgb urine dipstick: NEGATIVE
Ketones, ur: 40 mg/dL — AB
Leukocytes,Ua: NEGATIVE
Nitrite: NEGATIVE
Protein, ur: >300 mg/dL — AB
Specific Gravity, Urine: 1.03 (ref 1.005–1.030)
pH: 6.5 (ref 5.0–8.0)

## 2024-03-15 LAB — COMPREHENSIVE METABOLIC PANEL WITH GFR
ALT: 16 U/L (ref 0–44)
AST: 23 U/L (ref 15–41)
Albumin: 4.7 g/dL (ref 3.5–5.0)
Alkaline Phosphatase: 141 U/L — ABNORMAL HIGH (ref 38–126)
Anion gap: 13 (ref 5–15)
BUN: 13 mg/dL (ref 6–20)
CO2: 26 mmol/L (ref 22–32)
Calcium: 10.3 mg/dL (ref 8.9–10.3)
Chloride: 99 mmol/L (ref 98–111)
Creatinine, Ser: 1 mg/dL (ref 0.44–1.00)
GFR, Estimated: >60 mL/min (ref >60)
Glucose, Bld: 150 mg/dL — ABNORMAL HIGH (ref 70–99)
Potassium: 4.4 mmol/L (ref 3.5–5.1)
Sodium: 137 mmol/L (ref 135–145)
Total Bilirubin: 0.7 mg/dL (ref 0.0–1.2)
Total Protein: 8.5 g/dL — ABNORMAL HIGH (ref 6.5–8.1)

## 2024-03-15 LAB — LIPASE, BLOOD: Lipase: 19 U/L (ref 11–51)

## 2024-03-15 MED ORDER — ONDANSETRON 4 MG PO TBDP: 4.0000 mg | ORAL_TABLET | Freq: Three times a day (TID) | ORAL | 0 refills | Status: AC | PRN

## 2024-03-15 MED ORDER — IOHEXOL 300 MG/ML  SOLN
100.0000 mL | Freq: Once | INTRAMUSCULAR | Status: AC | PRN
Start: 2024-03-15 — End: 2024-03-15
  Administered 2024-03-15: 100 mL via INTRAVENOUS

## 2024-03-15 MED ORDER — SODIUM CHLORIDE 0.9 % IV BOLUS
1000.0000 mL | Freq: Once | INTRAVENOUS | Status: AC
  Administered 2024-03-15: 1000 mL via INTRAVENOUS

## 2024-03-15 MED ORDER — OXYCODONE-ACETAMINOPHEN 5-325 MG PO TABS: 1.0000 | ORAL_TABLET | Freq: Three times a day (TID) | ORAL | 0 refills | Status: AC | PRN

## 2024-03-15 MED ORDER — ONDANSETRON HCL 4 MG/2ML IJ SOLN
4.0000 mg | Freq: Once | INTRAMUSCULAR | Status: AC
  Administered 2024-03-15: 4 mg via INTRAVENOUS
  Filled 2024-03-15: qty 2

## 2024-03-15 MED ORDER — HYDROMORPHONE HCL 1 MG/ML IJ SOLN
1.0000 mg | Freq: Once | INTRAMUSCULAR | Status: AC
  Administered 2024-03-15: 1 mg via INTRAVENOUS
  Filled 2024-03-15: qty 1

## 2024-03-15 MED ORDER — ONDANSETRON 4 MG PO TBDP
4.0000 mg | ORAL_TABLET | Freq: Once | ORAL | Status: AC
  Administered 2024-03-15: 4 mg via ORAL
  Filled 2024-03-15: qty 1

## 2024-03-15 NOTE — ED Provider Notes (Signed)
 Patient care taken over at shift change.  Disposition pending p.o. challenge. Physical Exam  BP (!) 173/105   Pulse 95   Temp 98.4 F (36.9 C) (Oral)   Resp 16   LMP 12/18/2011   SpO2 96%   Physical Exam  Procedures  Procedures  ED Course / MDM    Medical Decision Making Amount and/or Complexity of Data Reviewed Labs: ordered. Radiology: ordered.  Risk Prescription drug management.   Patient able to tolerate ginger ale and crackers by mouth.  About an hour to hour and a half later she still reports feeling okay, not throwing up. Will send prescription of zofran  to the pharmacy.  She is stable and safe for discharge home.  Information for general surgery provided for patient to follow-up with. Return precautions given.   Sonnie Dusky, PA-C 03/15/24 1610    Almond Army, MD 03/18/24 [phone]

## 2024-03-15 NOTE — ED Notes (Signed)
 Patient vomited multiple times, yellow emesis; order received for zofran and dilaudid, administered per order.

## 2024-03-15 NOTE — Discharge Instructions (Addendum)
 As discussed, bowel obstruction seen on imaging is most likely due to your hernia, which was reduced in the ED.  Take Zofran  every 8 hours as needed for nausea/vomiting.  Sent a prescription of the medication you can take for pain.  Take it every 8 hours as needed.  Follow-up with general surgery for further evaluation and management.

## 2024-03-15 NOTE — ED Triage Notes (Signed)
 Upper abdo pain,( cramping), and vomiting Started last night, not holding any thing down Some loose stool

## 2024-03-15 NOTE — ED Provider Notes (Signed)
 [place] EMERGENCY DEPARTMENT AT [facility] Provider Note   CSN: [id number] Arrival date & time: 03/15/24  1259     History  Chief Complaint  Patient presents with   Abdominal Pain    Michele Garcia is a 58 y.o. female.  Patient with history of abdominal surgery due to "tumor removal", history of hernia --presents to the emergency department for evaluation of abdominal pain that started in the early morning hours.  This was associated with nausea and vomiting.  Patient had several bowel movements but not watery.  No blood in the stool.  Pain is across the upper abdomen, more left-sided predominant.  No urinary symptoms.  No chest pain or shortness of breath.  States that she has a upcoming appointment with cardiology for "trigeminy".      Home Medications Prior to Admission medications   Medication Sig Start Date End Date Taking? Authorizing Provider  albuterol  (VENTOLIN  HFA) 108 (90 Base) MCG/ACT inhaler Inhale 1-2 puffs into the lungs every 6 (six) hours as needed for wheezing or shortness of breath. 07/14/23   Dodson Freestone, FNP  aspirin EC 81 MG tablet Take 81 mg by mouth at bedtime.    [provider]  benzonatate  (TESSALON ) 100 MG capsule Take 1 capsule (100 mg total) by mouth every 8 (eight) hours as needed for cough. 07/14/23   Dodson Freestone, FNP  doxycycline  (VIBRAMYCIN ) 100 MG capsule Take 1 capsule (100 mg total) by mouth 2 (two) times daily. 07/19/22   Vernestine Gondola, PA-C  naproxen  (NAPROSYN ) 500 MG tablet Take 1 tablet (500 mg total) by mouth 2 (two) times daily. Patient not taking: Reported on 04/15/2021 12/11/20   Wieters, Hallie C, PA-C      Allergies    Patient has no known allergies.    Review of Systems   Review of Systems  Physical Exam Updated Vital Signs BP (!) 179/115 (BP Location: Right Arm)   Pulse 87   Temp 98.7 F (37.1 C) (Oral)   Resp 18   LMP 12/18/2011   SpO2 100%   Physical Exam Vitals and nursing note  reviewed.  Constitutional:      General: She is not in acute distress.    Appearance: She is well-developed.  HENT:     Head: Normocephalic and atraumatic.     Right Ear: External ear normal.     Left Ear: External ear normal.     Nose: Nose normal.  Eyes:     Conjunctiva/sclera: Conjunctivae normal.  Cardiovascular:     Rate and Rhythm: Regular rhythm. Tachycardia present. Occasional Extrasystoles are present.    Heart sounds: No murmur heard. Pulmonary:     Effort: No respiratory distress.     Breath sounds: No wheezing, rhonchi or rales.  Abdominal:     Palpations: Abdomen is soft.     Tenderness: There is no abdominal tenderness. There is no guarding or rebound.  Musculoskeletal:     Cervical back: Normal range of motion and neck supple.     Right lower leg: No edema.     Left lower leg: No edema.  Skin:    General: Skin is warm and dry.     Findings: No rash.  Neurological:     General: No focal deficit present.     Mental Status: She is alert. Mental status is at baseline.     Motor: No weakness.  Psychiatric:        Mood and Affect: Mood normal.  ED Results / Procedures / Treatments   Labs (all labs ordered are listed, but only abnormal results are displayed) Labs Reviewed  COMPREHENSIVE METABOLIC PANEL WITH GFR - Abnormal; Notable for the following components:      Result Value   Glucose, Bld 150 (*)    Total Protein 8.5 (*)    Alkaline Phosphatase 141 (*)    All other components within normal limits  CBC - Abnormal; Notable for the following components:   WBC 14.0 (*)    RBC 5.22 (*)    All other components within normal limits  URINALYSIS, ROUTINE W REFLEX MICROSCOPIC - Abnormal; Notable for the following components:   Ketones, ur 40 (*)    Protein, ur >300 (*)    All other components within normal limits  LIPASE, BLOOD    EKG None  Radiology CT ABDOMEN PELVIS W CONTRAST Result Date: 03/15/2024 CLINICAL DATA:  Upper abdominal pain, nausea and  vomiting EXAM: CT ABDOMEN AND PELVIS WITH CONTRAST TECHNIQUE: Multidetector CT imaging of the abdomen and pelvis was performed using the standard protocol following bolus administration of intravenous contrast. RADIATION DOSE REDUCTION: This exam was performed according to the departmental dose-optimization program which includes automated exposure control, adjustment of the mA and/or kV according to patient size and/or use of iterative reconstruction technique. CONTRAST:  OMNIPAQUE  IOHEXOL  300 MG/ML  SOLN COMPARISON:  11/25/2019 FINDINGS: Lower chest: Minimal right lower lobe consolidation likely atelectasis. No airspace disease or effusion. Hepatobiliary: Decreased liver attenuation consistent with hepatic steatosis. No focal liver abnormality. The gallbladder is unremarkable. No biliary duct dilation. Pancreas: Unremarkable. No pancreatic ductal dilatation or surrounding inflammatory changes. Spleen: Normal in size without focal abnormality. Adrenals/Urinary Tract: Adrenal glands are unremarkable. Kidneys are normal, without renal calculi, focal lesion, or hydronephrosis. Bladder is unremarkable. Stomach/Bowel: There is mild dilation of the jejunum measuring up to 2.6 cm in diameter, with multiple gas fluid levels noted. There is a transition identified at the level of a supraumbilical midline ventral hernia containing a short segment of distal jejunum, with relative decompression of the small bowel beyond the hernia. No bowel wall thickening or inflammatory change. Normal appendix right lower quadrant. Scattered colonic diverticulosis without diverticulitis. Vascular/Lymphatic: No significant vascular findings are present. No enlarged abdominal or pelvic lymph nodes. Reproductive: Status post hysterectomy. No adnexal masses. Other: No free fluid or free intraperitoneal gas. Supraumbilical ventral hernia as described above, containing a short segment of distal jejunum and mesenteric fat. There is resolving  small-bowel obstruction, without evidence of bowel ischemia or incarceration. Musculoskeletal: No acute or destructive bony abnormalities. Reconstructed images demonstrate no additional findings. IMPRESSION: 1. Small-bowel obstruction with transition point in the distal jejunum at the site of a midline supraumbilical ventral hernia. No evidence of hernia incarceration or bowel wall ischemia. 2. Scattered colonic diverticulosis without diverticulitis. 3. Hepatic steatosis. Electronically Signed   By: Bobbye Burrow M.D.   On: 03/15/2024 18:28    Procedures Hernia reduction  Date/Time: 03/15/2024 6:00 PM  Performed by: Lyna Sandhoff, PA-C Authorized by: Lyna Sandhoff, PA-C  Consent: Verbal consent obtained. Consent given by: patient Patient identity confirmed: verbally with patient and provided demographic data Comments: Patient prepped with ice pack, parenteral analgesia, Trendelenburg.  Gentle and steady pressure was used for about 5 minutes and able to feel hernia reduce.  Patient tolerated well.       Medications Ordered in ED Medications  ondansetron (ZOFRAN-ODT) disintegrating tablet 4 mg (4 mg Oral Given 03/15/24 1331)  sodium chloride  0.9 %  bolus 1,000 mL (0 mLs Intravenous Stopped 03/15/24 1740)  iohexol  (OMNIPAQUE ) 300 MG/ML solution 100 mL (100 mLs Intravenous Contrast Given 03/15/24 1622)  HYDROmorphone (DILAUDID) injection 1 mg (1 mg Intravenous Given 03/15/24 1753)  ondansetron (ZOFRAN) injection 4 mg (4 mg Intravenous Given 03/15/24 1753)    ED Course/ Medical Decision Making/ A&P    Patient seen and examined. History obtained directly from patient. Work-up including labs, imaging, EKG ordered in triage, if performed, were reviewed.    Labs/EKG: Independently reviewed and interpreted.  This included: CBC with elevated white blood cell count, normal hemoglobin; CMP with alk phos 141, glucose 150 otherwise unremarkable; lipase normal; UA ketones 40.   Imaging: CT abdomen  pelvis ordered  Medications/Fluids: Ordered: IV fluids, offered pain medication, declines.  Most recent vital signs reviewed and are as follows: BP (!) 179/115 (BP Location: Right Arm)   Pulse 87   Temp 98.7 F (37.1 C) (Oral)   Resp 18   LMP 12/18/2011   SpO2 100%   Initial impression: Abdominal pain, elevated white blood cell count  7:02 PM Reassessment performed. Patient appears stable on several rechecks however she did have additional vomiting prior to administration of pain medication.  Imaging personally visualized and interpreted including: CT scan of the abdomen pelvis, suspect SBO with ventral hernia.  This was eventually confirmed on radiology report.  Reviewed pertinent lab work and imaging with patient at bedside. Questions answered.   Most current vital signs reviewed and are as follows: BP (!) 172/102 (BP Location: Right Arm)   Pulse 77   Temp 98.4 F (36.9 C) (Oral)   Resp 16   LMP 12/18/2011   SpO2 96%   Plan: Reduction was likely successful.  Patient currently stable.  Plan to fluid challenge and eventually have her eat some crackers and take medications.  Signout to [employer] at shift change.                                     Medical Decision Making Amount and/or Complexity of Data Reviewed Labs: ordered. Radiology: ordered.  Risk Prescription drug management.   For this patient's complaint of abdominal pain, the following conditions were considered on the differential diagnosis: gastritis/PUD, enteritis/duodenitis, appendicitis, cholelithiasis/cholecystitis, cholangitis, pancreatitis, ruptured viscus, colitis, diverticulitis, small/large bowel obstruction, proctitis, cystitis, pyelonephritis, ureteral colic, aortic dissection, aortic aneurysm. In women, ectopic pregnancy, pelvic inflammatory disease, ovarian cysts, and tubo-ovarian abscess were also considered. Atypical chest etiologies were also considered including ACS, PE, and  pneumonia.          Final Clinical Impression(s) / ED Diagnoses Final diagnoses:  Ventral hernia with obstruction and without gangrene  Periumbilical abdominal pain  Nausea and vomiting, unspecified vomiting type    Rx / DC Orders ED Discharge Orders     None         Lyna Sandhoff, PA-C 03/15/24 Billy Bue, MD 03/18/24 [phone]

## 2024-05-08 ENCOUNTER — Ambulatory Visit
Admission: EM | Admit: 2024-05-08 | Discharge: 2024-05-08 | Disposition: A | Discharge status: Home / Self Care | Payer: Self-pay | Attending: Family Medicine | Admitting: Family Medicine

## 2024-05-08 ENCOUNTER — Ambulatory Visit (INDEPENDENT_AMBULATORY_CARE_PROVIDER_SITE_OTHER): Payer: Self-pay

## 2024-05-08 ENCOUNTER — Encounter: Payer: Self-pay | Admitting: Emergency Medicine

## 2024-05-08 DIAGNOSIS — R051 Acute cough: Secondary | ICD-10-CM

## 2024-05-08 DIAGNOSIS — J189 Pneumonia, unspecified organism: Secondary | ICD-10-CM

## 2024-05-08 MED ORDER — HYDROCODONE BIT-HOMATROP MBR 5-1.5 MG/5ML PO SOLN: 5.0000 mL | Freq: Four times a day (QID) | ORAL | 0 refills | Status: DC | PRN

## 2024-05-08 MED ORDER — DOXYCYCLINE HYCLATE 100 MG PO CAPS: 100.0000 mg | ORAL_CAPSULE | Freq: Two times a day (BID) | ORAL | 0 refills | Status: DC

## 2024-05-08 NOTE — ED Triage Notes (Signed)
 Pt presents c/o severe cough and chest congestion x 1 week. Pt denies any additional sxs.

## 2024-05-08 NOTE — ED Provider Notes (Signed)
 [facility] CARE [facility]ENTER   [id number] 05/08/24 Arrival Time: 0910  ASSESSMENT & PLAN:  1. Acute cough   2. Community acquired pneumonia of left lower lobe of lung    I have personally viewed and independently interpreted the imaging studies ordered this visit. CXR: subtle LLL infiltrate.  Without respiratory compromise. Outpt treatment. Begin: Meds ordered this encounter  Medications   doxycycline  (VIBRAMYCIN ) 100 MG capsule    Sig: Take 1 capsule (100 mg total) by mouth 2 (two) times daily.    Dispense:  14 capsule    Refill:  0   HYDROcodone  bit-homatropine (HYCODAN) 5-1.5 MG/5ML syrup    Sig: Take 5 mLs by mouth every 6 (six) hours as needed for cough.    Dispense:  90 mL    Refill:  0     Follow-up Information     [facility]).   Specialty: Urgent Care Why: If worsening or failing to improve as anticipated. Contact information: [address] [place]  [address] [phone]                Reviewed expectations re: course of current medical issues. Questions answered. Outlined signs and symptoms indicating need for more acute intervention. Understanding verbalized. After Visit Summary given.   SUBJECTIVE: History from: Patient. Michele Garcia is a 58 y.o. female. Pt presents c/o severe cough and chest congestion x 1 week. Pt denies any additional sxs. Is very fatigued. Denies CP/SOB. Reports subj fever. Normal PO intake without n/v/d.  OBJECTIVE:  Vitals:   05/08/24 1032 05/08/24 1033  BP:  (!) 170/89  Pulse:  66  Resp:  18  Temp:  97.6 F (36.4 C)  TempSrc:  Oral  SpO2:  97%  Weight: 122.5 kg     General appearance: alert; no distress Eyes: PERRLA; EOMI; conjunctiva normal HENT: [place]; AT; with mild nasal congestion Neck: supple  Lungs: speaks full sentences without difficulty; unlabored; CTAB Extremities: no edema Skin: warm and dry Neurologic: normal  gait Psychological: alert and cooperative; normal mood and affect    Imaging: DG Chest 2 View Result Date: 05/08/2024 CLINICAL DATA:  Cough x1 week EXAM: CHEST - 2 VIEW COMPARISON:  December 19, 2023 December 19, 2023 FINDINGS: Suspect possible retrocardiac left lower lobe patchy focal opacity. No pleural effusions. No pneumothorax. Cardiomediastinal silhouette is unchanged. No acute osseous findings. IMPRESSION: Possible focal opacity in the left lower lobe may represent pneumonia. Recommend follow-up to ensure resolution. Electronically Signed   By: Michaeline Blanch M.D.   On: 05/08/2024 10:59    No Known Allergies  Past Medical History:  Diagnosis Date   Hyperlipidemia    Hypertension    Narcolepsy    Social History   Socioeconomic History   Marital status: Married    Spouse name: Ayala Ribble   Number of children: 2   Years of education: 12th grade +   Highest education level: Not on file  Occupational History   Occupation: Set designer    Comment: Airline pilot in [place]  Tobacco Use   Smoking status: Never    Passive exposure: Never   Smokeless tobacco: Never  Vaping Use   Vaping status: Never Used  Substance and Sexual Activity   Alcohol use: No   Drug use: No   Sexual activity: Yes    Birth control/protection: Surgical  Other Topics Concern   Not on file  Social History Narrative   Lives with husband.  Adult children.   Social Drivers of Corporate investment banker Strain: Not on file  Food Insecurity: Medium Risk (01/23/2024)   Received from [facility]   Hunger Vital Sign    Within the past 12 months, you worried that your food would run out before you got money to buy more: Sometimes true    Within the past 12 months, the food you bought just didn't last and you didn't have money to get more. : Sometimes true  Transportation Needs: No Transportation Needs (01/23/2024)   Received from [facility]    In the past 12 months, has  lack of reliable transportation kept you from medical appointments, meetings, work or from getting things needed for daily living? : No  Physical Activity: Not on file  Stress: Not on file  Social Connections: Not on file  Intimate Partner Violence: Not on file   Family History  Problem Relation Age of Onset   Cancer Mother 24       breast   Breast cancer Mother    Cancer Maternal Grandmother        breast   Diabetes Maternal Grandmother    Breast cancer Maternal Grandmother    Diabetes Maternal Grandfather    Stroke Maternal Grandfather    Hypertension Maternal Grandfather    Past Surgical History:  Procedure Laterality Date   ABDOMINAL HYSTERECTOMY     CESAREAN SECTION     x 2   dermoid tumor       Rolinda Rogue, MD 05/08/24 1401

## 2024-05-10 ENCOUNTER — Other Ambulatory Visit: Payer: Self-pay | Admitting: *Deleted

## 2024-05-10 MED ORDER — ALBUTEROL SULFATE HFA 108 (90 BASE) MCG/ACT IN AERS: 1.0000 | INHALATION_SPRAY | Freq: Four times a day (QID) | RESPIRATORY_TRACT | 0 refills | Status: DC | PRN

## 2024-05-10 NOTE — Telephone Encounter (Signed)
 Message received from pt access:  phone message, mrn [phone] L. Burdett was seen 7/16 and is asking about an additional medication- she mentioned a nebulizer. If someone will call her whenever able. [phone]  I spoke with Ms. Kuras. She was seen Wednesday and dx with pneumonia. She asked if we could send in albuterol  for her to [facility] on [address]. Discussed with Luke Lesches, NP who states will send in Rx. Pt made aware.

## 2024-11-13 ENCOUNTER — Encounter (HOSPITAL_COMMUNITY): Payer: Self-pay

## 2024-11-13 ENCOUNTER — Other Ambulatory Visit: Payer: Self-pay

## 2024-11-13 ENCOUNTER — Emergency Department (HOSPITAL_COMMUNITY)
Admission: EM | Admit: 2024-11-13 | Discharge: 2024-11-13 | Disposition: A | Discharge status: Home / Self Care | Payer: Self-pay | Attending: Emergency Medicine | Admitting: Emergency Medicine

## 2024-11-13 ENCOUNTER — Emergency Department (HOSPITAL_COMMUNITY): Payer: Self-pay

## 2024-11-13 DIAGNOSIS — K430 Incisional hernia with obstruction, without gangrene: Secondary | ICD-10-CM | POA: Insufficient documentation

## 2024-11-13 DIAGNOSIS — I10 Essential (primary) hypertension: Secondary | ICD-10-CM | POA: Insufficient documentation

## 2024-11-13 DIAGNOSIS — Z7982 Long term (current) use of aspirin: Secondary | ICD-10-CM | POA: Insufficient documentation

## 2024-11-13 DIAGNOSIS — Z79899 Other long term (current) drug therapy: Secondary | ICD-10-CM | POA: Insufficient documentation

## 2024-11-13 LAB — CBC WITH DIFFERENTIAL/PLATELET
Abs Immature Granulocytes: 0.05 K/uL (ref 0.00–0.07)
Basophils Absolute: 0 K/uL (ref 0.0–0.1)
Basophils Relative: 0 %
Eosinophils Absolute: 0 K/uL (ref 0.0–0.5)
Eosinophils Relative: 0 %
HCT: 42.5 % (ref 36.0–46.0)
Hemoglobin: 13.6 g/dL (ref 12.0–15.0)
Immature Granulocytes: 0 %
Lymphocytes Relative: 13 %
Lymphs Abs: 1.5 K/uL (ref 0.7–4.0)
MCH: 27.8 pg (ref 26.0–34.0)
MCHC: 32 g/dL (ref 30.0–36.0)
MCV: 86.7 fL (ref 80.0–100.0)
Monocytes Absolute: 0.4 K/uL (ref 0.1–1.0)
Monocytes Relative: 3 %
Neutro Abs: 9.4 K/uL — ABNORMAL HIGH (ref 1.7–7.7)
Neutrophils Relative %: 84 %
Platelets: 259 K/uL (ref 150–400)
RBC: 4.9 MIL/uL (ref 3.87–5.11)
RDW: 12.7 % (ref 11.5–15.5)
WBC: 11.4 K/uL — ABNORMAL HIGH (ref 4.0–10.5)
nRBC: 0 % (ref 0.0–0.2)

## 2024-11-13 LAB — COMPREHENSIVE METABOLIC PANEL WITH GFR
ALT: 20 U/L (ref 0–44)
AST: 21 U/L (ref 15–41)
Albumin: 4.2 g/dL (ref 3.5–5.0)
Alkaline Phosphatase: 131 U/L — ABNORMAL HIGH (ref 38–126)
Anion gap: 13 (ref 5–15)
BUN: 12 mg/dL (ref 6–20)
CO2: 24 mmol/L (ref 22–32)
Calcium: 9.4 mg/dL (ref 8.9–10.3)
Chloride: 101 mmol/L (ref 98–111)
Creatinine, Ser: 0.98 mg/dL (ref 0.44–1.00)
GFR, Estimated: >60 mL/min
Glucose, Bld: 115 mg/dL — ABNORMAL HIGH (ref 70–99)
Potassium: 3.8 mmol/L (ref 3.5–5.1)
Sodium: 137 mmol/L (ref 135–145)
Total Bilirubin: 0.6 mg/dL (ref 0.0–1.2)
Total Protein: 7.6 g/dL (ref 6.5–8.1)

## 2024-11-13 LAB — URINALYSIS, ROUTINE W REFLEX MICROSCOPIC
Bilirubin Urine: NEGATIVE
Glucose, UA: NEGATIVE mg/dL
Hgb urine dipstick: NEGATIVE
Ketones, ur: 5 mg/dL — AB
Leukocytes,Ua: NEGATIVE
Nitrite: POSITIVE — AB
Protein, ur: 100 mg/dL — AB
Specific Gravity, Urine: 1.024 (ref 1.005–1.030)
pH: 5 (ref 5.0–8.0)

## 2024-11-13 LAB — LIPASE, BLOOD: Lipase: 14 U/L (ref 11–51)

## 2024-11-13 MED ORDER — LACTATED RINGERS IV BOLUS
1000.0000 mL | Freq: Once | INTRAVENOUS | Status: AC
  Administered 2024-11-13: 1000 mL via INTRAVENOUS

## 2024-11-13 MED ORDER — IOHEXOL 350 MG/ML SOLN
75.0000 mL | Freq: Once | INTRAVENOUS | Status: AC | PRN
  Administered 2024-11-13: 75 mL via INTRAVENOUS

## 2024-11-13 MED ORDER — ONDANSETRON 4 MG PO TBDP
4.0000 mg | ORAL_TABLET | Freq: Once | ORAL | Status: AC
  Administered 2024-11-13: 4 mg via ORAL
  Filled 2024-11-13: qty 1

## 2024-11-13 MED ORDER — MORPHINE SULFATE (PF) 4 MG/ML IV SOLN
4.0000 mg | Freq: Once | INTRAVENOUS | Status: AC
  Administered 2024-11-13: 4 mg via INTRAVENOUS
  Filled 2024-11-13: qty 1

## 2024-11-13 MED ORDER — OXYCODONE-ACETAMINOPHEN 5-325 MG PO TABS
2.0000 | ORAL_TABLET | Freq: Once | ORAL | Status: AC
  Administered 2024-11-13: 2 via ORAL
  Filled 2024-11-13: qty 2

## 2024-11-13 NOTE — ED Provider Triage Note (Signed)
 Emergency Medicine Provider Triage Evaluation Note  Michele Garcia , a 59 y.o. female  was evaluated in triage.  Pt complains of abdominal pain nausea and vomiting.  She had onset of symptoms yesterday.  She has a known ventral wall hernia from previous abdominal surgery.  Patient reports that she had to have it reduced last month.  Her hernia became firm and painful last night and she vomited several times.  She states it is softer this morning but she has had continued nausea and significant abdominal pain.  She denies fevers no history of bowel obstructions multiple previous abdominal surgeries..  Review of Systems  Positive: Abdominal pain Negative: Fever  Physical Exam  BP (!) 167/111 (BP Location: Left Arm)   Pulse 88   Temp 98.4 F (36.9 C) (Oral)   Resp 15   LMP 12/18/2011   SpO2 96%  Gen:   Awake, no distress   Resp:  Normal effort  MSK:   Moves extremities without difficulty  Other:  Tender ventral wall hernia, soft but not fully reduced.  Medical Decision Making  Medically screening exam initiated at 2:26 PM.  Appropriate orders placed.  Graeme Marko Moats was informed that the remainder of the evaluation will be completed by another provider, this initial triage assessment does not replace that evaluation, and the importance of remaining in the ED until their evaluation is complete.     Arloa Chroman, PA-C 11/13/24 1428

## 2024-11-13 NOTE — Discharge Instructions (Signed)
 Your hernia was reduced in the ER today.  Call the general surgery office tomorrow morning for an urgent outpatient appointment.  If you develop recurrent symptoms, abdominal pain, vomiting, or any other new/concerning symptoms then return to the ER.

## 2024-11-13 NOTE — ED Provider Notes (Signed)
 " [facility] Provider Note   CSN: [id number] Arrival date & time: 11/13/24  1309     Patient presents with: Abdominal Pain, Nausea, and Emesis   Michele Garcia is a 59 y.o. female.   HPI 59 year old female with a history of an umbilical hernia presents with recurrent abdominal pain and vomiting.  She had a similar episode in May 2025 and was diagnosed with a ventral supraumbilical hernia that was reduced in the ER with resolution of her symptoms.  Has had no prior issues until yesterday when she developed recurrent abdominal pain, a firm mass near her umbilicus, and vomiting.  Vomited as most recently as an hour or 2 prior to me seeing her.  The abdominal pain is moderate since she was given some pain meds in triage.  She has been having bowel movements and passing gas.  Pain does seem a little worse than it was before.  Prior to Admission medications  Medication Sig Start Date End Date Taking? Authorizing Provider  albuterol  (VENTOLIN  HFA) 108 (90 Base) MCG/ACT inhaler Inhale 1-2 puffs into the lungs every 6 (six) hours as needed for wheezing or shortness of breath. 05/10/24   Arloa Suzen RAMAN, NP  aspirin EC 81 MG tablet Take 81 mg by mouth at bedtime.    [provider]  benzonatate  (TESSALON ) 100 MG capsule Take 1 capsule (100 mg total) by mouth every 8 (eight) hours as needed for cough. 07/14/23   Hazen Darryle BRAVO, FNP  doxycycline  (VIBRAMYCIN ) 100 MG capsule Take 1 capsule (100 mg total) by mouth 2 (two) times daily. 05/08/24   Rolinda Rogue, MD  HYDROcodone  bit-homatropine (HYCODAN) 5-1.5 MG/5ML syrup Take 5 mLs by mouth every 6 (six) hours as needed for cough. 05/08/24   Rolinda Rogue, MD  losartan (COZAAR) 25 MG tablet Take 1 tablet by mouth daily. 01/30/24 01/29/25  [provider]  naproxen  (NAPROSYN ) 500 MG tablet Take 1 tablet (500 mg total) by mouth 2 (two) times daily. Patient not taking: Reported on 04/15/2021  12/11/20   Wieters, Hallie C, PA-C  phentermine 15 MG capsule Take 15 mg by mouth. 02/13/24   [provider]    Allergies: Patient has no known allergies.    Review of Systems  Gastrointestinal:  Positive for abdominal pain and vomiting. Negative for constipation.    Updated Vital Signs BP (!) 125/57   Pulse 66   Temp 98.6 F (37 C) (Oral)   Resp 18   LMP 12/18/2011   SpO2 100%   Physical Exam Vitals and nursing note reviewed.  Constitutional:      General: She is not in acute distress.    Appearance: She is well-developed. She is obese. She is not ill-appearing or diaphoretic.  HENT:     Head: Normocephalic and atraumatic.  Pulmonary:     Effort: Pulmonary effort is normal.  Abdominal:     Palpations: Abdomen is soft.     Tenderness: There is abdominal tenderness in the periumbilical area.     Hernia: A hernia is present. Hernia is present in the umbilical area.  Skin:    General: Skin is warm and dry.  Neurological:     Mental Status: She is alert.     (all labs ordered are listed, but only abnormal results are displayed) Labs Reviewed  URINALYSIS, ROUTINE W REFLEX MICROSCOPIC - Abnormal; Notable for the following components:      Result Value   APPearance HAZY (*)  Ketones, ur 5 (*)    Protein, ur 100 (*)    Nitrite POSITIVE (*)    Bacteria, UA MANY (*)    All other components within normal limits  CBC WITH DIFFERENTIAL/PLATELET - Abnormal; Notable for the following components:   WBC 11.4 (*)    Neutro Abs 9.4 (*)    All other components within normal limits  COMPREHENSIVE METABOLIC PANEL WITH GFR - Abnormal; Notable for the following components:   Glucose, Bld 115 (*)    Alkaline Phosphatase 131 (*)    All other components within normal limits  LIPASE, BLOOD    EKG: None  Radiology: CT ABDOMEN PELVIS W CONTRAST Result Date: 11/13/2024 CLINICAL DATA:  Abdominal pain.  Hernia suspected. EXAM: CT ABDOMEN AND PELVIS WITH CONTRAST TECHNIQUE:  Multidetector CT imaging of the abdomen and pelvis was performed using the standard protocol following bolus administration of intravenous contrast. RADIATION DOSE REDUCTION: This exam was performed according to the departmental dose-optimization program which includes automated exposure control, adjustment of the mA and/or kV according to patient size and/or use of iterative reconstruction technique. CONTRAST:  75mL OMNIPAQUE  IOHEXOL  350 MG/ML SOLN COMPARISON:  CT abdomen pelvis dated 03/15/2024. FINDINGS: Lower chest: There is lung bases are clear. No intra-abdominal free air or free fluid. Hepatobiliary: Fatty liver. No biliary dilatation. The gallbladder is unremarkable Pancreas: Unremarkable. No pancreatic ductal dilatation or surrounding inflammatory changes. Spleen: Normal in size without focal abnormality. Adrenals/Urinary Tract: The adrenal glands, kidneys, visualized ureters, and urinary bladder appear unremarkable. Stomach/Bowel: Several small scattered colonic diverticula noted. There is herniation of a short segment of small bowel into the midline supraumbilical hernia. There is pinching of the bowel in the neck of the hernia. There is dilatation and edema of the small bowel loops proximal to the hernia consistent with bowel obstruction. Overall thickened and inflamed small bowel proximal to the hernia worsened since the prior CT. Developing ischemia is not excluded. Clinical correlation and surgical consult is advised. There is no pneumatosis at this time. The appendix is normal. Vascular/Lymphatic: The abdominal aorta and IVC unremarkable. No portal venous gas. There is no adenopathy. Reproductive: Hysterectomy.  No suspicious adnexal masses. Other: Midline vertical anterior pelvic wall incisional scar. Musculoskeletal: Degenerative changes of the spine. No acute osseous pathology. IMPRESSION: 1. Supraumbilical hernia containing a short segment of small bowel with small-bowel obstruction. Early bowel  ischemia is not excluded. Surgical consult is advised. 2. Fatty liver. Electronically Signed   By: Vanetta Chou M.D.   On: 11/13/2024 16:16     Hernia reduction  Date/Time: 11/13/2024 6:54 PM  Performed by: Freddi Hamilton, MD Authorized by: Freddi Hamilton, MD  Consent: Verbal consent obtained Local anesthesia used: no  Anesthesia: Local anesthesia used: no  Sedation: Patient sedated: no  Patient tolerance: patient tolerated the procedure well with no immediate complications Comments: Gentle pressure reduced hernia      Medications Ordered in the ED  ondansetron  (ZOFRAN -ODT) disintegrating tablet 4 mg (4 mg Oral Given 11/13/24 1447)  oxyCODONE -acetaminophen  (PERCOCET/ROXICET) 5-325 MG per tablet 2 tablet (2 tablets Oral Given 11/13/24 1447)  iohexol  (OMNIPAQUE ) 350 MG/ML injection 75 mL (75 mLs Intravenous Contrast Given 11/13/24 1609)  lactated ringers  bolus 1,000 mL (0 mLs Intravenous Stopped 11/13/24 1848)  morphine  (PF) 4 MG/ML injection 4 mg (4 mg Intravenous Given 11/13/24 1730)  Medical Decision Making Amount and/or Complexity of Data Reviewed External Data Reviewed: notes. Labs: ordered.    Details: Mild leukocytosis Radiology: independent interpretation performed.    Details: Hernia with SBO  Risk Prescription drug management.   Patient presents with a recurrent hernia.  Seems to have been reduced at the bedside by myself.  She is feeling a lot better.  I did discuss with Dr. Teresa of general surgery who came to evaluate patient.  Given she is able to tolerate p.o., he is okay with her going home and getting this treated as an outpatient.  Will need to call the office in the morning.  Otherwise, she is feeling well enough for discharge and will be given return precautions.     Final diagnoses:  Recurrent ventral hernia with incarceration    ED Discharge Orders     None          Freddi Hamilton, MD 11/13/24  2303  "

## 2024-11-13 NOTE — Consult Note (Signed)
 "  CC: Incisional hernia containing small bowel ?incarcerated Requesting physician: Glendia Breeding, MD  HPI: Michele Garcia is an 59 y.o. female hx HTN, HLD whom presented emergency department for evaluation of supraumbilical discomfort, nausea, vomiting which began yesterday.  She reports this felt very similar to an event that led to her presenting to the urgent care/[facility] back May 2025.  She noticed a firm mass just above her umbilicus yesterday and began applying steady pressure to this.  The mass subsequently softened and her symptoms have improved.  She was evaluated in the emergency department here and had a CT scan that demonstrated an evident incisional hernia in the supraumbilical position containing a loop of small bowel.  Dr. Breeding was able to apply steady pressure and felt as though it was likely reduced but had asked us  to evaluate her to ensure nothing appears to be incarcerated.  Currently, she reports the pain at her supraumbilical region has significantly improved.  No active nausea or vomiting.  Denies any significant abdominal pain at present.  Her husband is present at bedside.  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, removed and abdominal wall had to be repaired with mesh in [place]  due to size of resection TAH Past Medical History:  Diagnosis Date   Hyperlipidemia    Hypertension    Narcolepsy     Past Surgical History:  Procedure Laterality Date   ABDOMINAL HYSTERECTOMY     CESAREAN SECTION     x 2   dermoid tumor      Family History  Problem Relation Age of Onset   Cancer Mother 46       breast   Breast cancer Mother    Cancer Maternal Grandmother        breast   Diabetes Maternal Grandmother    Breast cancer Maternal Grandmother    Diabetes Maternal Grandfather    Stroke Maternal Grandfather    Hypertension Maternal Grandfather     Social:  reports that she has never smoked. She has never been  exposed to tobacco smoke. She has never used smokeless tobacco. She reports that she does not drink alcohol and does not use drugs.  Allergies: Allergies[1]  Medications: I have reviewed the patient's current medications.  Results for orders placed or performed during the hospital encounter of 11/13/24 (from the past 48 hours)  Urinalysis, Routine w reflex microscopic -Urine, Clean Catch     Status: Abnormal   Collection Time: 11/13/24  2:27 PM  Result Value Ref Range   Color, Urine YELLOW YELLOW   APPearance HAZY (A) CLEAR   Specific Gravity, Urine 1.024 1.005 - 1.030   pH 5.0 5.0 - 8.0   Glucose, UA NEGATIVE NEGATIVE mg/dL   Hgb urine dipstick NEGATIVE NEGATIVE   Bilirubin Urine NEGATIVE NEGATIVE   Ketones, ur 5 (A) NEGATIVE mg/dL   Protein, ur 899 (A) NEGATIVE mg/dL   Nitrite POSITIVE (A) NEGATIVE   Leukocytes,Ua NEGATIVE NEGATIVE   RBC / HPF 0-5 0 - 5 RBC/hpf   WBC, UA 0-5 0 - 5 WBC/hpf   Bacteria, UA MANY (A) NONE SEEN   Squamous Epithelial / HPF 0-5 0 - 5 /HPF   Mucus PRESENT     Comment: Performed at [facility], [address] [place], [place] [address]  CBC with Differential     Status: Abnormal   Collection Time: 11/13/24  2:30 PM  Result Value Ref Range   WBC  11.4 (H) 4.0 - 10.5 K/uL   RBC 4.90 3.87 - 5.11 MIL/uL   Hemoglobin 13.6 12.0 - 15.0 g/dL   HCT 57.4 63.9 - 53.9 %   MCV 86.7 80.0 - 100.0 fL   MCH 27.8 26.0 - 34.0 pg   MCHC 32.0 30.0 - 36.0 g/dL   RDW 87.2 88.4 - 84.4 %   Platelets 259 150 - 400 K/uL   nRBC 0.0 0.0 - 0.2 %   Neutrophils Relative % 84 %   Neutro Abs 9.4 (H) 1.7 - 7.7 K/uL   Lymphocytes Relative 13 %   Lymphs Abs 1.5 0.7 - 4.0 K/uL   Monocytes Relative 3 %   Monocytes Absolute 0.4 0.1 - 1.0 K/uL   Eosinophils Relative 0 %   Eosinophils Absolute 0.0 0.0 - 0.5 K/uL   Basophils Relative 0 %   Basophils Absolute 0.0 0.0 - 0.1 K/uL   Immature Granulocytes 0 %   Abs Immature Granulocytes 0.05 0.00 - 0.07 K/uL    Comment: Performed  at [facility], [address], [place], [place] [address]  Comprehensive metabolic panel     Status: Abnormal   Collection Time: 11/13/24  2:30 PM  Result Value Ref Range   Sodium 137 135 - 145 mmol/L   Potassium 3.8 3.5 - 5.1 mmol/L   Chloride 101 98 - 111 mmol/L   CO2 24 22 - 32 mmol/L   Glucose, Bld 115 (H) 70 - 99 mg/dL    Comment: Glucose reference range applies only to samples taken after fasting for at least 8 hours.   BUN 12 6 - 20 mg/dL   Creatinine, Ser 9.01 0.44 - 1.00 mg/dL   Calcium 9.4 8.9 - 89.6 mg/dL   Total Protein 7.6 6.5 - 8.1 g/dL   Albumin 4.2 3.5 - 5.0 g/dL   AST 21 15 - 41 U/L   ALT 20 0 - 44 U/L   Alkaline Phosphatase 131 (H) 38 - 126 U/L   Total Bilirubin 0.6 0.0 - 1.2 mg/dL   GFR, Estimated >39 >39 mL/min    Comment: (NOTE) Calculated using the CKD-EPI Creatinine Equation (2021)    Anion gap 13 5 - 15    Comment: Performed at [facility], [address] [place], [place] [address]  Lipase, blood     Status: None   Collection Time: 11/13/24  2:30 PM  Result Value Ref Range   Lipase 14 11 - 51 U/L    Comment: Performed at [facility], [address], [place], [place] [address]    CT ABDOMEN PELVIS W CONTRAST Result Date: 11/13/2024 CLINICAL DATA:  Abdominal pain.  Hernia suspected. EXAM: CT ABDOMEN AND PELVIS WITH CONTRAST TECHNIQUE: Multidetector CT imaging of the abdomen and pelvis was performed using the standard protocol following bolus administration of intravenous contrast. RADIATION DOSE REDUCTION: This exam was performed according to the departmental dose-optimization program which includes automated exposure control, adjustment of the mA and/or kV according to patient size and/or use of iterative reconstruction technique. CONTRAST:  75mL OMNIPAQUE  IOHEXOL  350 MG/ML SOLN COMPARISON:  CT abdomen pelvis dated 03/15/2024. FINDINGS: Lower chest: There is lung bases are clear. No intra-abdominal free air or free fluid. Hepatobiliary:  Fatty liver. No biliary dilatation. The gallbladder is unremarkable Pancreas: Unremarkable. No pancreatic ductal dilatation or surrounding inflammatory changes. Spleen: Normal in size without focal abnormality. Adrenals/Urinary Tract: The adrenal glands, kidneys, visualized ureters, and urinary bladder appear unremarkable. Stomach/Bowel: Several small scattered colonic  diverticula noted. There is herniation of a short segment of small bowel into the midline supraumbilical hernia. There is pinching of the bowel in the neck of the hernia. There is dilatation and edema of the small bowel loops proximal to the hernia consistent with bowel obstruction. Overall thickened and inflamed small bowel proximal to the hernia worsened since the prior CT. Developing ischemia is not excluded. Clinical correlation and surgical consult is advised. There is no pneumatosis at this time. The appendix is normal. Vascular/Lymphatic: The abdominal aorta and IVC unremarkable. No portal venous gas. There is no adenopathy. Reproductive: Hysterectomy.  No suspicious adnexal masses. Other: Midline vertical anterior pelvic wall incisional scar. Musculoskeletal: Degenerative changes of the spine. No acute osseous pathology. IMPRESSION: 1. Supraumbilical hernia containing a short segment of small bowel with small-bowel obstruction. Early bowel ischemia is not excluded. Surgical consult is advised. 2. Fatty liver. Electronically Signed   By: Vanetta Chou M.D.   On: 11/13/2024 16:16    ROS - all of the below systems have been reviewed with the patient and positives are indicated with bold text General: chills, fever or night sweats Eyes: blurry vision or double vision ENT: epistaxis or sore throat Allergy/Immunology: itchy/watery eyes or nasal congestion Hematologic/Lymphatic: bleeding problems, blood clots or swollen lymph nodes Endocrine: temperature intolerance or unexpected weight changes Breast: new or changing breast lumps or  nipple discharge Resp: cough, shortness of breath, or wheezing CV: chest pain or dyspnea on exertion GI: as per HPI GU: dysuria, trouble voiding, or hematuria MSK: joint pain or joint stiffness Neuro: TIA or stroke symptoms Derm: pruritus and skin lesion changes Psych: anxiety and depression  PE Blood pressure (!) 125/57, pulse 66, temperature 98.6 F (37 C), temperature source Oral, resp. rate 18, last menstrual period 12/18/2011, SpO2 100%. Constitutional: NAD; conversant Eyes: Moist conjunctiva Lungs: Normal respiratory effort CV: RRR GI: Abd obese, soft, nontender, nondistended; palpable supraumbilical incisional hernia which is now soft without overlying skin changes; freely reducible contents across fascial defect - nothing appears to be incarcerated at present Psychiatric: Appropriate affect  Results for orders placed or performed during the hospital encounter of 11/13/24 (from the past 48 hours)  Urinalysis, Routine w reflex microscopic -Urine, Clean Catch     Status: Abnormal   Collection Time: 11/13/24  2:27 PM  Result Value Ref Range   Color, Urine YELLOW YELLOW   APPearance HAZY (A) CLEAR   Specific Gravity, Urine 1.024 1.005 - 1.030   pH 5.0 5.0 - 8.0   Glucose, UA NEGATIVE NEGATIVE mg/dL   Hgb urine dipstick NEGATIVE NEGATIVE   Bilirubin Urine NEGATIVE NEGATIVE   Ketones, ur 5 (A) NEGATIVE mg/dL   Protein, ur 899 (A) NEGATIVE mg/dL   Nitrite POSITIVE (A) NEGATIVE   Leukocytes,Ua NEGATIVE NEGATIVE   RBC / HPF 0-5 0 - 5 RBC/hpf   WBC, UA 0-5 0 - 5 WBC/hpf   Bacteria, UA MANY (A) NONE SEEN   Squamous Epithelial / HPF 0-5 0 - 5 /HPF   Mucus PRESENT     Comment: Performed at [facility], [address], [place], [place] [address]  CBC with Differential     Status: Abnormal   Collection Time: 11/13/24  2:30 PM  Result Value Ref Range   WBC 11.4 (H) 4.0 - 10.5 K/uL   RBC 4.90 3.87 - 5.11 MIL/uL   Hemoglobin 13.6 12.0 - 15.0 g/dL   HCT 57.4 63.9 - 53.9 %    MCV 86.7 80.0 - 100.0  fL   MCH 27.8 26.0 - 34.0 pg   MCHC 32.0 30.0 - 36.0 g/dL   RDW 87.2 88.4 - 84.4 %   Platelets 259 150 - 400 K/uL   nRBC 0.0 0.0 - 0.2 %   Neutrophils Relative % 84 %   Neutro Abs 9.4 (H) 1.7 - 7.7 K/uL   Lymphocytes Relative 13 %   Lymphs Abs 1.5 0.7 - 4.0 K/uL   Monocytes Relative 3 %   Monocytes Absolute 0.4 0.1 - 1.0 K/uL   Eosinophils Relative 0 %   Eosinophils Absolute 0.0 0.0 - 0.5 K/uL   Basophils Relative 0 %   Basophils Absolute 0.0 0.0 - 0.1 K/uL   Immature Granulocytes 0 %   Abs Immature Granulocytes 0.05 0.00 - 0.07 K/uL    Comment: Performed at [facility], [address], [place], [place] [address]  Comprehensive metabolic panel     Status: Abnormal   Collection Time: 11/13/24  2:30 PM  Result Value Ref Range   Sodium 137 135 - 145 mmol/L   Potassium 3.8 3.5 - 5.1 mmol/L   Chloride 101 98 - 111 mmol/L   CO2 24 22 - 32 mmol/L   Glucose, Bld 115 (H) 70 - 99 mg/dL    Comment: Glucose reference range applies only to samples taken after fasting for at least 8 hours.   BUN 12 6 - 20 mg/dL   Creatinine, Ser 9.01 0.44 - 1.00 mg/dL   Calcium 9.4 8.9 - 89.6 mg/dL   Total Protein 7.6 6.5 - 8.1 g/dL   Albumin 4.2 3.5 - 5.0 g/dL   AST 21 15 - 41 U/L   ALT 20 0 - 44 U/L   Alkaline Phosphatase 131 (H) 38 - 126 U/L   Total Bilirubin 0.6 0.0 - 1.2 mg/dL   GFR, Estimated >39 >39 mL/min    Comment: (NOTE) Calculated using the CKD-EPI Creatinine Equation (2021)    Anion gap 13 5 - 15    Comment: Performed at [facility], [address] [place], [place] [address]  Lipase, blood     Status: None   Collection Time: 11/13/24  2:30 PM  Result Value Ref Range   Lipase 14 11 - 51 U/L    Comment: Performed at [facility], [address] [place], [place] [address]    CT ABDOMEN PELVIS W CONTRAST Result Date: 11/13/2024 CLINICAL DATA:  Abdominal pain.  Hernia suspected. EXAM: CT ABDOMEN AND PELVIS WITH CONTRAST TECHNIQUE: Multidetector  CT imaging of the abdomen and pelvis was performed using the standard protocol following bolus administration of intravenous contrast. RADIATION DOSE REDUCTION: This exam was performed according to the departmental dose-optimization program which includes automated exposure control, adjustment of the mA and/or kV according to patient size and/or use of iterative reconstruction technique. CONTRAST:  75mL OMNIPAQUE  IOHEXOL  350 MG/ML SOLN COMPARISON:  CT abdomen pelvis dated 03/15/2024. FINDINGS: Lower chest: There is lung bases are clear. No intra-abdominal free air or free fluid. Hepatobiliary: Fatty liver. No biliary dilatation. The gallbladder is unremarkable Pancreas: Unremarkable. No pancreatic ductal dilatation or surrounding inflammatory changes. Spleen: Normal in size without focal abnormality. Adrenals/Urinary Tract: The adrenal glands, kidneys, visualized ureters, and urinary bladder appear unremarkable. Stomach/Bowel: Several small scattered colonic diverticula noted. There is herniation of a short segment of small bowel into the midline supraumbilical hernia. There is pinching of the bowel in the neck of the hernia. There is dilatation and edema of the small bowel  loops proximal to the hernia consistent with bowel obstruction. Overall thickened and inflamed small bowel proximal to the hernia worsened since the prior CT. Developing ischemia is not excluded. Clinical correlation and surgical consult is advised. There is no pneumatosis at this time. The appendix is normal. Vascular/Lymphatic: The abdominal aorta and IVC unremarkable. No portal venous gas. There is no adenopathy. Reproductive: Hysterectomy.  No suspicious adnexal masses. Other: Midline vertical anterior pelvic wall incisional scar. Musculoskeletal: Degenerative changes of the spine. No acute osseous pathology. IMPRESSION: 1. Supraumbilical hernia containing a short segment of small bowel with small-bowel obstruction. Early bowel ischemia is  not excluded. Surgical consult is advised. 2. Fatty liver. Electronically Signed   By: Vanetta Chou M.D.   On: 11/13/2024 16:16    A/P: Michele Garcia is an 59 y.o. female with HTN, HLD here with recent suspected partially incarcerated incisional hernia which has since been reduced and is now freely reducible across the fascial defect  - We spent time reviewing with her the CT findings and what we noted on exam today.  Her abdominal exam is reassuring with a freely reducible hernia and no significant tenderness on examination.  We discussed options going forward. I think it would be reasonable for PO challenge and monitoring - if she is doing well with food and drink without nausea/vomiting would plan for her to follow-up in our office with one of our hernia specialists to discuss potential repair options. Given her prior stated abdominal wall reconstruction with mesh at time of desmoid resection would likely be best approached methodically if at all possible. Additionally, if she were successful at achieving some weight loss in preparation for this, it would optimize success rates in this repair. All of this reviewed with her and her husband at bedside. Their questions were welcomed and answered, they expressed understanding and agreement with the plan.  - If any issues with nausea/vomiting after PO challenge and observation, please let us  know  I spent a total of 80 minutes in both face-to-face and non-face-to-face activities, excluding procedures performed, for this visit on the date of this encounter.  Lonni Pizza, MD [facility]     [1] No Known Allergies  "

## 2024-11-13 NOTE — ED Triage Notes (Signed)
 Pt. Stated, I have a hernia in my abdomen and they put it back in 6 months ago, suppose to have surgery but had no insurance. T He pain came back last night with N/V. It stop for a few and back again. No other symptoms.

## 2024-11-15 ENCOUNTER — Other Ambulatory Visit: Payer: Self-pay

## 2024-11-15 ENCOUNTER — Encounter (HOSPITAL_COMMUNITY): Payer: Self-pay

## 2024-11-15 ENCOUNTER — Inpatient Hospital Stay (HOSPITAL_COMMUNITY): Admission: RE | Admit: 2024-11-15 | Discharge: 2024-11-18 | DRG: 354 | Disposition: A | Payer: Self-pay

## 2024-11-15 DIAGNOSIS — I10 Essential (primary) hypertension: Secondary | ICD-10-CM | POA: Diagnosis present

## 2024-11-15 DIAGNOSIS — Z7982 Long term (current) use of aspirin: Secondary | ICD-10-CM

## 2024-11-15 DIAGNOSIS — Z833 Family history of diabetes mellitus: Secondary | ICD-10-CM

## 2024-11-15 DIAGNOSIS — Z79899 Other long term (current) drug therapy: Secondary | ICD-10-CM

## 2024-11-15 DIAGNOSIS — Z823 Family history of stroke: Secondary | ICD-10-CM

## 2024-11-15 DIAGNOSIS — Z6841 Body Mass Index (BMI) 40.0 and over, adult: Secondary | ICD-10-CM

## 2024-11-15 DIAGNOSIS — K430 Incisional hernia with obstruction, without gangrene: Principal | ICD-10-CM | POA: Diagnosis present

## 2024-11-15 DIAGNOSIS — E785 Hyperlipidemia, unspecified: Secondary | ICD-10-CM | POA: Diagnosis present

## 2024-11-15 DIAGNOSIS — E876 Hypokalemia: Secondary | ICD-10-CM | POA: Diagnosis present

## 2024-11-15 DIAGNOSIS — Z803 Family history of malignant neoplasm of breast: Secondary | ICD-10-CM

## 2024-11-15 DIAGNOSIS — E66813 Obesity, class 3: Secondary | ICD-10-CM | POA: Diagnosis present

## 2024-11-15 DIAGNOSIS — K450 Other specified abdominal hernia with obstruction, without gangrene: Principal | ICD-10-CM | POA: Diagnosis present

## 2024-11-15 DIAGNOSIS — Z8249 Family history of ischemic heart disease and other diseases of the circulatory system: Secondary | ICD-10-CM

## 2024-11-15 DIAGNOSIS — Z90710 Acquired absence of both cervix and uterus: Secondary | ICD-10-CM

## 2024-11-15 LAB — CBC
HCT: 38.6 % (ref 36.0–46.0)
Hemoglobin: 12.2 g/dL (ref 12.0–15.0)
MCH: 27.9 pg (ref 26.0–34.0)
MCHC: 31.6 g/dL (ref 30.0–36.0)
MCV: 88.3 fL (ref 80.0–100.0)
Platelets: 240 K/uL (ref 150–400)
RBC: 4.37 MIL/uL (ref 3.87–5.11)
RDW: 12.7 % (ref 11.5–15.5)
WBC: 7.4 K/uL (ref 4.0–10.5)
nRBC: 0 % (ref 0.0–0.2)

## 2024-11-15 LAB — CREATININE, SERUM
Creatinine, Ser: 0.99 mg/dL (ref 0.44–1.00)
GFR, Estimated: >60 mL/min

## 2024-11-15 MED ORDER — HYDROMORPHONE HCL 1 MG/ML IJ SOLN: 0.5000 mg | INTRAMUSCULAR | Status: DC | PRN

## 2024-11-15 MED ORDER — ENOXAPARIN SODIUM 40 MG/0.4ML IJ SOSY
40.0000 mg | PREFILLED_SYRINGE | INTRAMUSCULAR | Status: DC
  Administered 2024-11-15 – 2024-11-17 (×3): 40 mg via SUBCUTANEOUS
  Filled 2024-11-15 (×3): qty 0.4

## 2024-11-15 MED ORDER — SIMETHICONE 80 MG PO CHEW
80.0000 mg | CHEWABLE_TABLET | Freq: Four times a day (QID) | ORAL | Status: DC | PRN
  Administered 2024-11-16 – 2024-11-17 (×3): 80 mg via ORAL
  Filled 2024-11-15 (×3): qty 1

## 2024-11-15 MED ORDER — LACTATED RINGERS IV SOLN: INTRAVENOUS | Status: AC

## 2024-11-15 MED ORDER — DOCUSATE SODIUM 100 MG PO CAPS
100.0000 mg | ORAL_CAPSULE | Freq: Two times a day (BID) | ORAL | Status: DC
  Administered 2024-11-15 – 2024-11-18 (×5): 100 mg via ORAL
  Filled 2024-11-15 (×5): qty 1

## 2024-11-15 MED ORDER — OXYCODONE HCL 5 MG PO TABS: 5.0000 mg | ORAL_TABLET | ORAL | Status: DC | PRN

## 2024-11-15 MED ORDER — PROCHLORPERAZINE EDISYLATE 10 MG/2ML IJ SOLN: 10.0000 mg | INTRAMUSCULAR | Status: DC | PRN

## 2024-11-15 MED ORDER — GABAPENTIN 300 MG PO CAPS
300.0000 mg | ORAL_CAPSULE | Freq: Three times a day (TID) | ORAL | Status: DC
  Administered 2024-11-15 – 2024-11-18 (×7): 300 mg via ORAL
  Filled 2024-11-15 (×4): qty 3
  Filled 2024-11-15: qty 1
  Filled 2024-11-15: qty 3
  Filled 2024-11-15: qty 1

## 2024-11-15 MED ORDER — OXYCODONE HCL 5 MG PO TABS: 10.0000 mg | ORAL_TABLET | ORAL | Status: DC | PRN

## 2024-11-15 MED ORDER — METHOCARBAMOL 1000 MG/10ML IJ SOLN: 500.0000 mg | Freq: Four times a day (QID) | INTRAMUSCULAR | Status: DC | PRN

## 2024-11-15 MED ORDER — ONDANSETRON HCL 4 MG/2ML IJ SOLN
4.0000 mg | Freq: Four times a day (QID) | INTRAMUSCULAR | Status: DC | PRN
  Administered 2024-11-16: 4 mg via INTRAVENOUS
  Filled 2024-11-15: qty 2

## 2024-11-15 MED ORDER — ACETAMINOPHEN 325 MG PO TABS
650.0000 mg | ORAL_TABLET | Freq: Four times a day (QID) | ORAL | Status: DC
  Administered 2024-11-16 – 2024-11-18 (×8): 650 mg via ORAL
  Filled 2024-11-15 (×9): qty 2

## 2024-11-15 MED ORDER — KETOROLAC TROMETHAMINE 15 MG/ML IJ SOLN
15.0000 mg | Freq: Three times a day (TID) | INTRAMUSCULAR | Status: DC
  Administered 2024-11-15 – 2024-11-18 (×7): 15 mg via INTRAVENOUS
  Filled 2024-11-15 (×7): qty 1

## 2024-11-15 NOTE — H&P (Signed)
 "  Admitting Physician: Deward PARAS Dartanyon Frankowski  Service: General Surgery  CC: Incarcerated Hernia  Subjective   HPI:  Michele Garcia is a 59 y.o. female who is seen today as an office consultation for evaluation of New Consultation   History of Present Illness Michele Garcia is a 59 year old female with prior abdominal wall hernia repair and mesh placement who presents with recurrent incarcerated ventral hernia and bowel obstruction.   She initially developed an abdominal wall hernia approximately five months ago, associated with episodes of abdominal pressure, pain, and vomiting. The hernia was reduced in the emergency department, and she subsequently underwent surgical repair with mesh placement. Despite intervention, the hernia has recurred.   She presented overnight with recurrent abdominal wall hernia, significant abdominal pressure, pain, and repeated episodes of vomiting. She was unable to reduce the hernia herself. Attempts at reduction were made in the emergency department, and she was discharged home, but the hernia recurred shortly after. She continues to experience persistent bulging, discomfort, ongoing nausea, and is currently avoiding oral intake due to fear of symptom exacerbation. She also reports recent onset of shortness of breath, difficulty sleeping, and fatigue following her emergency department visit.   Her surgical history is notable for multiple abdominal procedures, including prior umbilical hernia repair and tumor excision post-pregnancy. She has multiple abdominal scars and reports mesh placement during one of her prior surgeries, possibly over the left abdomen. She notes her abdomen has remained protuberant since these interventions.   She has previously delayed seeking care due to financial concerns but now seeks definitive management due to worsening symptoms and inability to manage the hernia at home.      Past Medical History:  Diagnosis Date    Hyperlipidemia    Hypertension    Narcolepsy     Past Surgical History:  Procedure Laterality Date   ABDOMINAL HYSTERECTOMY     CESAREAN SECTION     x 2   dermoid tumor      Family History  Problem Relation Age of Onset   Cancer Mother 51       breast   Breast cancer Mother    Cancer Maternal Grandmother        breast   Diabetes Maternal Grandmother    Breast cancer Maternal Grandmother    Diabetes Maternal Grandfather    Stroke Maternal Grandfather    Hypertension Maternal Grandfather     Social:  reports that she has never smoked. She has never been exposed to tobacco smoke. She has never used smokeless tobacco. She reports that she does not drink alcohol and does not use drugs.  Allergies: Allergies[1]  Medications: Current Outpatient Medications  Medication Instructions   albuterol  (VENTOLIN  HFA) 108 (90 Base) MCG/ACT inhaler 1-2 puffs, Inhalation, Every 6 hours PRN   aspirin EC 81 mg, Oral, Daily at bedtime   benzonatate  (TESSALON ) 100 mg, Oral, Every 8 hours PRN   doxycycline  (VIBRAMYCIN ) 100 mg, Oral, 2 times daily   HYDROcodone  bit-homatropine (HYCODAN) 5-1.5 MG/5ML syrup 5 mLs, Oral, Every 6 hours PRN   losartan  (COZAAR ) 25 MG tablet 1 tablet, Daily   naproxen  (NAPROSYN ) 500 mg, Oral, 2 times daily   phentermine 15 mg    ROS - all of the below systems have been reviewed with the patient and positives are indicated with bold text General: chills, fever or night sweats Eyes: blurry vision or double vision ENT: epistaxis or sore throat Allergy/Immunology: itchy/watery eyes or nasal congestion Hematologic/Lymphatic: bleeding  problems, blood clots or swollen lymph nodes Endocrine: temperature intolerance or unexpected weight changes Breast: new or changing breast lumps or nipple discharge Resp: cough, shortness of breath, or wheezing CV: chest pain or dyspnea on exertion GI: as per HPI GU: dysuria, trouble voiding, or hematuria MSK: joint pain or joint  stiffness Neuro: TIA or stroke symptoms Derm: pruritus and skin lesion changes Psych: anxiety and depression  Objective   PE Blood pressure (!) 164/100, pulse 72, temperature 97.9 F (36.6 C), resp. rate 18, last menstrual period 12/18/2011, SpO2 99%. Constitutional: NAD; conversant; no deformities Eyes: Moist conjunctiva; no lid lag; anicteric; PERRL Neck: Trachea midline; no thyromegaly Lungs: Normal respiratory effort; no tactile fremitus CV: RRR; no palpable thrills; no pitting edema GI: Abd Tender incarcerated ventral hernia, feels to slide some with palpation but can't get it to reduce fully on exam  MSK: Normal range of motion of extremities; no clubbing/cyanosis Psychiatric: Appropriate affect; alert and oriented x3 Lymphatic: No palpable cervical or axillary lymphadenopathy  No results found for this or any previous visit (from the past 24 hours).  Imaging Orders  No imaging studies ordered today  CT abd/Pel 11/13/24  IMPRESSION: 1. Supraumbilical hernia containing a short segment of small bowel with small-bowel obstruction. Early bowel ischemia is not excluded. Surgical consult is advised. 2. Fatty liver.  6.9 cm x 3.5 cm periumbilical hernia area involving umbilical and supra umbilical defects including a larger defect with signs of bowel ischemia and obstruction consistent with incarceration.     Assessment and Plan   Michele Garcia is an 59 y.o. female with Incarcerated ventral hernia with obstruction Recurrent incarcerated ventral hernia with associated bowel obstruction, evidenced by persistent bulging, nausea, and failed prior reduction attempts. CT imaging demonstrated thickened bowel wall at the hernia site, raising concern for compromised blood flow and possible ischemia. The hernia recurred after prior emergency reduction and is unlikely to remain reduced without surgical intervention. Prior mesh placement and multiple abdominal surgeries complicate  repair. There is risk of further bowel compromise if not addressed urgently. If intraoperative findings reveal nonviable bowel or contamination, mesh placement may be deferred due to infection risk, and primary closure would be performed, which carries a higher risk of hernia recurrence. Mesh placement in a contaminated field is contraindicated due to infection risk and potential need for mesh removal if infected. - Arranged hospital admission to the general surgery service. - Planned surgical repair of the hernia as early as the following day, pending surgical team availability. - Discussed potential need for bowel resection if intraoperative findings reveal nonviable bowel. - Explained that if bowel resection or contamination is present, mesh placement may be deferred to avoid infection risk, and primary closure would be performed instead, with increased risk of hernia recurrence. - Planned postoperative inpatient monitoring for at least overnight, possibly up to 3-4 days, to ensure return of bowel function and adequate oral intake prior to discharge. - Coordinated with office staff to assist her in accessing financial assistance programs for surgery.   Deward JINNY Foy, MD  [facility] Use [email or web address] to contact on call provider       [1] No Known Allergies  "

## 2024-11-16 ENCOUNTER — Encounter (HOSPITAL_COMMUNITY): Admission: RE | Disposition: A | Payer: Self-pay | Source: Home / Self Care

## 2024-11-16 ENCOUNTER — Inpatient Hospital Stay (HOSPITAL_COMMUNITY): Payer: Self-pay | Admitting: Certified Registered Nurse Anesthetist

## 2024-11-16 DIAGNOSIS — K430 Incisional hernia with obstruction, without gangrene: Secondary | ICD-10-CM

## 2024-11-16 DIAGNOSIS — Z6841 Body Mass Index (BMI) 40.0 and over, adult: Secondary | ICD-10-CM

## 2024-11-16 DIAGNOSIS — I10 Essential (primary) hypertension: Secondary | ICD-10-CM

## 2024-11-16 LAB — CBC
HCT: 36.5 % (ref 36.0–46.0)
Hemoglobin: 11.5 g/dL — ABNORMAL LOW (ref 12.0–15.0)
MCH: 27.8 pg (ref 26.0–34.0)
MCHC: 31.5 g/dL (ref 30.0–36.0)
MCV: 88.2 fL (ref 80.0–100.0)
Platelets: 226 10*3/uL (ref 150–400)
RBC: 4.14 MIL/uL (ref 3.87–5.11)
RDW: 12.7 % (ref 11.5–15.5)
WBC: 6.6 10*3/uL (ref 4.0–10.5)
nRBC: 0 % (ref 0.0–0.2)

## 2024-11-16 LAB — SURGICAL PCR SCREEN
MRSA, PCR: NEGATIVE
Staphylococcus aureus: NEGATIVE

## 2024-11-16 LAB — BASIC METABOLIC PANEL WITH GFR
Anion gap: 9 (ref 5–15)
BUN: 13 mg/dL (ref 6–20)
CO2: 28 mmol/L (ref 22–32)
Calcium: 9.3 mg/dL (ref 8.9–10.3)
Chloride: 104 mmol/L (ref 98–111)
Creatinine, Ser: 0.92 mg/dL (ref 0.44–1.00)
GFR, Estimated: >60 mL/min
Glucose, Bld: 98 mg/dL (ref 70–99)
Potassium: 3.6 mmol/L (ref 3.5–5.1)
Sodium: 140 mmol/L (ref 135–145)

## 2024-11-16 LAB — MISC LABCORP TEST (SEND OUT)
LabCorp test name: 83935
Labcorp test code: 83935

## 2024-11-16 MED ORDER — ACETAMINOPHEN 10 MG/ML IV SOLN
INTRAVENOUS | Status: AC
  Filled 2024-11-16: qty 100

## 2024-11-16 MED ORDER — LIDOCAINE HCL (CARDIAC) PF 100 MG/5ML IV SOSY
PREFILLED_SYRINGE | INTRAVENOUS | Status: DC | PRN
  Administered 2024-11-16: 60 mg via INTRATRACHEAL

## 2024-11-16 MED ORDER — CEFAZOLIN SODIUM-DEXTROSE 2-4 GM/100ML-% IV SOLN
INTRAVENOUS | Status: AC
  Filled 2024-11-16: qty 100

## 2024-11-16 MED ORDER — OXYCODONE HCL 5 MG/5ML PO SOLN: 5.0000 mg | Freq: Once | ORAL | Status: DC | PRN

## 2024-11-16 MED ORDER — OXYCODONE HCL 5 MG PO TABS: 5.0000 mg | ORAL_TABLET | Freq: Once | ORAL | Status: DC | PRN

## 2024-11-16 MED ORDER — SCOPOLAMINE 1 MG/3DAYS TD PT72
MEDICATED_PATCH | TRANSDERMAL | Status: DC | PRN
  Administered 2024-11-16: 1 via TRANSDERMAL

## 2024-11-16 MED ORDER — PROPOFOL 500 MG/50ML IV EMUL
INTRAVENOUS | Status: AC
  Filled 2024-11-16: qty 50

## 2024-11-16 MED ORDER — ONDANSETRON HCL 4 MG/2ML IJ SOLN
INTRAMUSCULAR | Status: DC | PRN
  Administered 2024-11-16: 4 mg via INTRAVENOUS

## 2024-11-16 MED ORDER — 0.9 % SODIUM CHLORIDE (POUR BTL) OPTIME
TOPICAL | Status: DC | PRN
  Administered 2024-11-16: 1000 mL

## 2024-11-16 MED ORDER — ROCURONIUM BROMIDE 10 MG/ML (PF) SYRINGE
PREFILLED_SYRINGE | INTRAVENOUS | Status: DC | PRN
  Administered 2024-11-16: 40 mg via INTRAVENOUS
  Administered 2024-11-16: 20 mg via INTRAVENOUS

## 2024-11-16 MED ORDER — DEXAMETHASONE SOD PHOSPHATE PF 10 MG/ML IJ SOLN
INTRAMUSCULAR | Status: AC
  Filled 2024-11-16: qty 1

## 2024-11-16 MED ORDER — BUPIVACAINE LIPOSOME 1.3 % IJ SUSP
INTRAMUSCULAR | Status: AC
  Filled 2024-11-16: qty 20

## 2024-11-16 MED ORDER — CEFAZOLIN SODIUM-DEXTROSE 2-3 GM-%(50ML) IV SOLR
INTRAVENOUS | Status: DC | PRN
  Administered 2024-11-16: 2 g via INTRAVENOUS

## 2024-11-16 MED ORDER — ONDANSETRON HCL 4 MG/2ML IJ SOLN
INTRAMUSCULAR | Status: AC
  Filled 2024-11-16: qty 2

## 2024-11-16 MED ORDER — SUGAMMADEX SODIUM 200 MG/2ML IV SOLN
INTRAVENOUS | Status: AC
  Filled 2024-11-16: qty 2

## 2024-11-16 MED ORDER — FENTANYL CITRATE (PF) 100 MCG/2ML IJ SOLN
INTRAMUSCULAR | Status: AC
  Filled 2024-11-16: qty 2

## 2024-11-16 MED ORDER — MIDAZOLAM HCL 5 MG/5ML IJ SOLN
INTRAMUSCULAR | Status: DC | PRN
  Administered 2024-11-16: 2 mg via INTRAVENOUS

## 2024-11-16 MED ORDER — KETOROLAC TROMETHAMINE 30 MG/ML IJ SOLN: 30.0000 mg | Freq: Once | INTRAMUSCULAR | Status: DC | PRN

## 2024-11-16 MED ORDER — BUPIVACAINE-EPINEPHRINE 0.25% -1:200000 IJ SOLN
INTRAMUSCULAR | Status: DC | PRN
  Administered 2024-11-16: 30 mL

## 2024-11-16 MED ORDER — ACETAMINOPHEN 10 MG/ML IV SOLN
INTRAVENOUS | Status: DC | PRN
  Administered 2024-11-16: 1000 mg via INTRAVENOUS

## 2024-11-16 MED ORDER — SUGAMMADEX SODIUM 200 MG/2ML IV SOLN
INTRAVENOUS | Status: DC | PRN
  Administered 2024-11-16: 200 mg via INTRAVENOUS

## 2024-11-16 MED ORDER — ACETAMINOPHEN 10 MG/ML IV SOLN: 1000.0000 mg | Freq: Once | INTRAVENOUS | Status: DC | PRN

## 2024-11-16 MED ORDER — HYDRALAZINE HCL 20 MG/ML IJ SOLN
10.0000 mg | Freq: Four times a day (QID) | INTRAMUSCULAR | Status: DC | PRN
  Administered 2024-11-16 – 2024-11-17 (×2): 10 mg via INTRAVENOUS
  Filled 2024-11-16 (×2): qty 1

## 2024-11-16 MED ORDER — SCOPOLAMINE 1 MG/3DAYS TD PT72
MEDICATED_PATCH | TRANSDERMAL | Status: AC
  Filled 2024-11-16: qty 1

## 2024-11-16 MED ORDER — AMISULPRIDE (ANTIEMETIC) 5 MG/2ML IV SOLN: 10.0000 mg | Freq: Once | INTRAVENOUS | Status: DC | PRN

## 2024-11-16 MED ORDER — MIDAZOLAM HCL 2 MG/2ML IJ SOLN
INTRAMUSCULAR | Status: AC
  Filled 2024-11-16: qty 2

## 2024-11-16 MED ORDER — DEXAMETHASONE SODIUM PHOSPHATE 4 MG/ML IJ SOLN
INTRAMUSCULAR | Status: DC | PRN
  Administered 2024-11-16: 5 mg via INTRAVENOUS

## 2024-11-16 MED ORDER — FENTANYL CITRATE (PF) 100 MCG/2ML IJ SOLN
INTRAMUSCULAR | Status: DC | PRN
  Administered 2024-11-16: 100 ug via INTRAVENOUS
  Administered 2024-11-16 (×2): 50 ug via INTRAVENOUS

## 2024-11-16 MED ORDER — METOPROLOL TARTRATE 5 MG/5ML IV SOLN
5.0000 mg | Freq: Four times a day (QID) | INTRAVENOUS | Status: DC | PRN
  Administered 2024-11-16: 5 mg via INTRAVENOUS
  Filled 2024-11-16: qty 5

## 2024-11-16 MED ORDER — PROPOFOL 10 MG/ML IV BOLUS
INTRAVENOUS | Status: DC | PRN
  Administered 2024-11-16: 200 mg via INTRAVENOUS

## 2024-11-16 MED ORDER — FENTANYL CITRATE (PF) 50 MCG/ML IJ SOSY: 25.0000 ug | PREFILLED_SYRINGE | INTRAMUSCULAR | Status: DC | PRN

## 2024-11-16 MED ORDER — BUPIVACAINE-EPINEPHRINE (PF) 0.25% -1:200000 IJ SOLN
INTRAMUSCULAR | Status: AC
  Filled 2024-11-16: qty 30

## 2024-11-16 NOTE — Interval H&P Note (Signed)
 History and Physical Interval Note:  11/16/2024 7:50 AM  Michele Garcia  has presented today for surgery, with the diagnosis of INCISIONAL HERNIA.  The various methods of treatment have been discussed with the patient and family. After consideration of risks, benefits and other options for treatment, the patient has consented to  Procedures: REPAIR, HERNIA, INCISIONAL (N/A) as a surgical intervention.  The patient's history has been reviewed, patient examined, no change in status, stable for surgery.  I have reviewed the patient's chart and labs.  Questions were answered to the patient's satisfaction.     Drucilla Cumber DELENA Freund

## 2024-11-16 NOTE — Anesthesia Procedure Notes (Signed)
 Procedure Name: Intubation Date/Time: 11/16/2024 8:12 AM  Performed by: Judythe Tanda Aran, CRNAPre-anesthesia Checklist: Patient identified, Emergency Drugs available, Suction available and Patient being monitored Patient Re-evaluated:Patient Re-evaluated prior to induction Oxygen  Delivery Method: Circle system utilized Preoxygenation: Pre-oxygenation with 100% oxygen  Induction Type: IV induction Ventilation: Mask ventilation without difficulty Laryngoscope Size: 2 and Miller Grade View: Grade I Tube type: Oral Tube size: 7.0 mm Number of attempts: 1 Airway Equipment and Method: Stylet Placement Confirmation: ETT inserted through vocal cords under direct vision, positive ETCO2 and breath sounds checked- equal and bilateral Secured at: 21 cm Tube secured with: Tape Dental Injury: Teeth and Oropharynx as per pre-operative assessment

## 2024-11-16 NOTE — Anesthesia Preprocedure Evaluation (Addendum)
"                                    Anesthesia Evaluation  Patient identified by MRN, date of birth, ID band Patient awake    Reviewed: Allergy & Precautions, NPO status , Patient's Chart, lab work & pertinent test results  Airway Mallampati: II  TM Distance: >3 FB Neck ROM: Full    Dental  (+) Missing   Pulmonary neg pulmonary ROS   Pulmonary exam normal        Cardiovascular hypertension, Pt. on medications Normal cardiovascular exam     Neuro/Psych negative neurological ROS  negative psych ROS   GI/Hepatic negative GI ROS, Neg liver ROS,,,  Endo/Other    Class 3 obesity  Renal/GU negative Renal ROS     Musculoskeletal negative musculoskeletal ROS (+)    Abdominal  (+) + obese  Peds  Hematology negative hematology ROS (+)   Anesthesia Other Findings INCISIONAL HERNIA  Reproductive/Obstetrics                              Anesthesia Physical Anesthesia Plan  ASA: 3  Anesthesia Plan: General   Post-op Pain Management:    Induction: Intravenous  PONV Risk Score and Plan: 4 or greater and Ondansetron , Dexamethasone , Midazolam , Scopolamine  patch - Pre-op and Treatment may vary due to age or medical condition  Airway Management Planned: Oral ETT  Additional Equipment:   Intra-op Plan:   Post-operative Plan: Extubation in OR  Informed Consent: I have reviewed the patients History and Physical, chart, labs and discussed the procedure including the risks, benefits and alternatives for the proposed anesthesia with the patient or authorized representative who has indicated his/her understanding and acceptance.     Dental advisory given  Plan Discussed with: CRNA  Anesthesia Plan Comments:          Anesthesia Quick Evaluation  "

## 2024-11-16 NOTE — Op Note (Signed)
 Operative Note  Michele Garcia  [id number]  [id number]  11/16/2024   Surgeon: Mitzie Freund MD FACS   Procedure performed: Open primary repair of incarcerated incisional hernia, fascial defect 2.5 cm   Preop diagnosis: Incarcerated incisional hernia with history of bowel incarceration Post-op diagnosis/intraop findings: Same, containing large volume of fat.  Cicatrix/prior mesh appreciated at left aspect of hernia defect, no bowel incarcerated at time of surgery but suspect adhesions to anterior abdominal wall   Specimens: no Retained items: no  EBL: Minimal cc Complications: none   Description of procedure: After obtaining informed consent the patient was taken to the operating room and placed supine on operating room table where general endotracheal anesthesia was initiated, preoperative antibiotics were administered, SCDs applied, and a formal timeout was performed.  The abdomen was prepped and draped in the usual sterile fashion.  A small vertical midline incision was created, excising some of the prior scar in the supraumbilical region.  The soft tissues were dissected with cautery until the hernia sac was encountered and this was dissected down to the level of the fascia.  The hernia sac was then entered and debrided to the level of the fascia.  This was noted to contain a large volume of omentum which was able to be gently reduced.  The fascial defect was cleaned off circumferentially and noted to measure approximately 2.5 cm in diameter.  There was thick scar at the left aspect of the hernia defect and 1 visible permanent suture from her prior surgery.  On finger sweep there felt to be adhesions inferior to the hernia defect along the abdominal wall which were gently bluntly swept away to clear space for the repair.  It appeared that a formal mesh repair would require more extensive adhesiolysis and therefore this was deferred at this time.  The fascial defect was closed transversely  with interrupted 0 Ethibond sutures, keeping the sutures in the extraperitoneal plane to avoid any occult injury to the suspected intra-abdominal adhesed structures.  Once the fascia was closed, a field block was performed with quarter percent Marcaine  with epinephrine .  The deep soft tissues were reapproximated with interrupted 3-0 Vicryl's after confirming hemostasis.  The skin was closed with interrupted deep dermal 3-0 Vicryl's and running subcuticular 4-0 Monocryl.  Benzoin, Steri-Strips and a gauze dressing were then applied.  The patient was then awakened, extubated and taken to PACU in stable condition.    All counts were correct at the completion of the case.

## 2024-11-16 NOTE — Anesthesia Postprocedure Evaluation (Signed)
"   Anesthesia Post Note  Patient: Michele Garcia  Procedure(s) Performed: REPAIR, HERNIA, INCISIONAL (Abdomen)     Patient location during evaluation: PACU Anesthesia Type: General Level of consciousness: awake Pain management: pain level controlled Vital Signs Assessment: post-procedure vital signs reviewed and stable Respiratory status: spontaneous breathing, nonlabored ventilation and respiratory function stable Cardiovascular status: blood pressure returned to baseline and stable Postop Assessment: no apparent nausea or vomiting Anesthetic complications: no   No notable events documented.  Last Vitals:  Vitals:   11/16/24 1432 11/16/24 1537  BP: (!) 171/93 (!) 178/98  Pulse: 76 72  Resp: 16 16  Temp: 36.6 C 36.8 C  SpO2: 98% 99%    Last Pain:  Vitals:   11/16/24 1537  TempSrc: Oral  PainSc:                  Crystalann Korf P Larisha Vencill      "

## 2024-11-16 NOTE — Transfer of Care (Signed)
 Immediate Anesthesia Transfer of Care Note  Patient: Michele Garcia  Procedure(s) Performed: REPAIR, HERNIA, INCISIONAL (Abdomen)  Patient Location: PACU  Anesthesia Type:General  Level of Consciousness: awake  Airway & Oxygen  Therapy: Patient Spontanous Breathing and Patient connected to face mask  Post-op Assessment: Report given to RN and Post -op Vital signs reviewed and stable  Post vital signs: Reviewed and stable  Last Vitals:  Vitals Value Taken Time  BP 172/116 11/16/24 09:25  Temp    Pulse 80 11/16/24 09:29  Resp 24 11/16/24 09:29  SpO2 85 % 11/16/24 09:29  Vitals shown include unfiled device data.  Last Pain:  Vitals:   11/16/24 0732  TempSrc:   PainSc: 0-No pain         Complications: No notable events documented.

## 2024-11-17 ENCOUNTER — Encounter (HOSPITAL_COMMUNITY): Payer: Self-pay | Admitting: Surgery

## 2024-11-17 LAB — BASIC METABOLIC PANEL WITH GFR
Anion gap: 10 (ref 5–15)
BUN: 10 mg/dL (ref 6–20)
CO2: 25 mmol/L (ref 22–32)
Calcium: 9.5 mg/dL (ref 8.9–10.3)
Chloride: 103 mmol/L (ref 98–111)
Creatinine, Ser: 1.04 mg/dL — ABNORMAL HIGH (ref 0.44–1.00)
GFR, Estimated: >60 mL/min
Glucose, Bld: 152 mg/dL — ABNORMAL HIGH (ref 70–99)
Potassium: 3.4 mmol/L — ABNORMAL LOW (ref 3.5–5.1)
Sodium: 138 mmol/L (ref 135–145)

## 2024-11-17 LAB — CBC
HCT: 38.7 % (ref 36.0–46.0)
Hemoglobin: 12.1 g/dL (ref 12.0–15.0)
MCH: 27.5 pg (ref 26.0–34.0)
MCHC: 31.3 g/dL (ref 30.0–36.0)
MCV: 88 fL (ref 80.0–100.0)
Platelets: 233 10*3/uL (ref 150–400)
RBC: 4.4 MIL/uL (ref 3.87–5.11)
RDW: 12.7 % (ref 11.5–15.5)
WBC: 11.7 10*3/uL — ABNORMAL HIGH (ref 4.0–10.5)
nRBC: 0 % (ref 0.0–0.2)

## 2024-11-17 MED ORDER — POTASSIUM CHLORIDE 10 MEQ/100ML IV SOLN: 10.0000 meq | INTRAVENOUS | Status: DC

## 2024-11-17 MED ORDER — POTASSIUM CHLORIDE 20 MEQ PO PACK
60.0000 meq | PACK | Freq: Once | ORAL | Status: AC
  Administered 2024-11-17: 60 meq via ORAL
  Filled 2024-11-17: qty 3

## 2024-11-17 MED ORDER — LOSARTAN POTASSIUM 25 MG PO TABS
25.0000 mg | ORAL_TABLET | Freq: Every day | ORAL | Status: DC
  Administered 2024-11-17 – 2024-11-18 (×2): 25 mg via ORAL
  Filled 2024-11-17 (×3): qty 1

## 2024-11-17 MED ORDER — MENTHOL 3 MG MT LOZG
1.0000 | LOZENGE | OROMUCOSAL | Status: DC | PRN
  Administered 2024-11-17: 3 mg via ORAL
  Filled 2024-11-17: qty 9

## 2024-11-17 MED ORDER — POTASSIUM CHLORIDE 10 MEQ/100ML IV SOLN
10.0000 meq | INTRAVENOUS | Status: DC
  Filled 2024-11-17: qty 100

## 2024-11-17 NOTE — Progress Notes (Addendum)
 1 Day Post-Op   Subjective/Chief Complaint: Feels well today, better than before surgery.  She is tolerating liquids without any nausea and reports pain is well-controlled.  She does feel bloated.  She is passing flatus but no bowel movement yet.   Objective: Vital signs in last 24 hours: Temp:  [97.6 F (36.4 C)-98.6 F (37 C)] 97.6 F (36.4 C) (01/25 0510) Pulse Rate:  [59-99] 68 (01/25 0510) Resp:  [13-24] 18 (01/25 0510) BP: (138-185)/(91-115) 150/91 (01/25 0510) SpO2:  [85 %-100 %] 96 % (01/25 0510) Last BM Date : 11/15/24  Intake/Output from previous day: 01/24 0701 - 01/25 0700 In: 1710 [P.O.:560; I.V.:1000; IV Piggyback:150] Out: 1110 [Urine:1100; Blood:10] Intake/Output this shift: No intake/output data recorded.  Alert, well-appearing Unlabored respirations Abdomen is soft, obese, appropriately mildly tender around incision.  Or dressing is clean, dry and intact and there is no appreciable swelling, hematoma or cellulitis  Lab Results:  Recent Labs    11/16/24 0437 11/17/24 0505  WBC 6.6 11.7*  HGB 11.5* 12.1  HCT 36.5 38.7  PLT 226 233   BMET Recent Labs    11/16/24 0437 11/17/24 0505  NA 140 138  K 3.6 3.4*  CL 104 103  CO2 28 25  GLUCOSE 98 152*  BUN 13 10  CREATININE 0.92 1.04*  CALCIUM 9.3 9.5   PT/INR No results for input(s): LABPROT, INR in the last 72 hours. ABG No results for input(s): PHART, HCO3 in the last 72 hours.  Invalid input(s): PCO2, PO2  Studies/Results: No results found.  Anti-infectives: Anti-infectives (From admission, onward)    None       Assessment/Plan:  1/24: Open primary repair of incarcerated incisional hernia  Advance diet Hypokalemia 3.4-IV potassium ordered Creatinine very slightly increased-monitor/recheck tomorrow, may need to stop Toradol  Mobilize Ileus is not unlikely though there was not really much bowel manipulation IntraOp.  Potential discharge tomorrow if she is doing well  and able to have a bowel movement.   LOS: 2 days    Mitzie DELENA Freund 11/17/2024

## 2024-11-17 NOTE — Plan of Care (Signed)
" °  Problem: Education: Goal: Knowledge of General Education information will improve Description: Including pain rating scale, medication(s)/side effects and non-pharmacologic comfort measures Outcome: Progressing   Problem: Health Behavior/Discharge Planning: Goal: Ability to manage health-related needs will improve Outcome: Progressing   Problem: Clinical Measurements: Goal: Ability to maintain clinical measurements within normal limits will improve Outcome: Progressing Goal: Will remain free from infection Outcome: Progressing Goal: Diagnostic test results will improve Outcome: Progressing Goal: Respiratory complications will improve Outcome: Progressing Goal: Cardiovascular complication will be avoided Outcome: Progressing   Problem: Activity: Goal: Risk for activity intolerance will decrease Outcome: Progressing   Problem: Nutrition: Goal: Adequate nutrition will be maintained Outcome: Progressing   Problem: Coping: Goal: Level of anxiety will decrease Outcome: Progressing   Problem: Elimination: Goal: Will not experience complications related to bowel motility Outcome: Progressing Goal: Will not experience complications related to urinary retention Outcome: Progressing   Problem: Pain Managment: Goal: General experience of comfort will improve and/or be controlled Outcome: Progressing   Problem: Safety: Goal: Ability to remain free from injury will improve Outcome: Progressing   Problem: Skin Integrity: Goal: Risk for impaired skin integrity will decrease Outcome: Progressing   Problem: Education: Goal: Knowledge of General Education information will improve Description: Including pain rating scale, medication(s)/side effects and non-pharmacologic comfort measures Outcome: Progressing   Problem: Health Behavior/Discharge Planning: Goal: Ability to manage health-related needs will improve Outcome: Progressing   Problem: Clinical Measurements: Goal:  Ability to maintain clinical measurements within normal limits will improve Outcome: Progressing Goal: Will remain free from infection Outcome: Progressing Goal: Diagnostic test results will improve Outcome: Progressing Goal: Respiratory complications will improve Outcome: Progressing Goal: Cardiovascular complication will be avoided Outcome: Progressing   Problem: Activity: Goal: Risk for activity intolerance will decrease Outcome: Progressing   Problem: Nutrition: Goal: Adequate nutrition will be maintained Outcome: Progressing   Problem: Coping: Goal: Level of anxiety will decrease Outcome: Progressing   Problem: Elimination: Goal: Will not experience complications related to bowel motility Outcome: Progressing Goal: Will not experience complications related to urinary retention Outcome: Progressing   Problem: Pain Managment: Goal: General experience of comfort will improve and/or be controlled Outcome: Progressing   Problem: Safety: Goal: Ability to remain free from injury will improve Outcome: Progressing   Problem: Skin Integrity: Goal: Risk for impaired skin integrity will decrease Outcome: Progressing   Problem: Education: Goal: Knowledge of the prescribed therapeutic regimen will improve Outcome: Progressing   Problem: Bowel/Gastric: Goal: Gastrointestinal status for postoperative course will improve Outcome: Progressing   Problem: Cardiac: Goal: Ability to maintain an adequate cardiac output Outcome: Progressing Goal: Will show no evidence of cardiac arrhythmias Outcome: Progressing   Problem: Nutritional: Goal: Will attain and maintain optimal nutritional status Outcome: Progressing   Problem: Neurological: Goal: Will regain or maintain usual level of consciousness Outcome: Progressing   Problem: Clinical Measurements: Goal: Ability to maintain clinical measurements within normal limits Outcome: Progressing Goal: Postoperative complications  will be avoided or minimized Outcome: Progressing   Problem: Respiratory: Goal: Will regain and/or maintain adequate ventilation Outcome: Progressing Goal: Respiratory status will improve Outcome: Progressing   Problem: Skin Integrity: Goal: Demonstrates signs of wound healing without infection Outcome: Progressing   Problem: Urinary Elimination: Goal: Will remain free from infection Outcome: Progressing Goal: Ability to achieve and maintain adequate urine output Outcome: Progressing   "

## 2024-11-18 ENCOUNTER — Other Ambulatory Visit (HOSPITAL_COMMUNITY): Payer: Self-pay

## 2024-11-18 ENCOUNTER — Encounter (HOSPITAL_COMMUNITY): Payer: Self-pay

## 2024-11-18 LAB — CBC
HCT: 36.6 % (ref 36.0–46.0)
Hemoglobin: 11.8 g/dL — ABNORMAL LOW (ref 12.0–15.0)
MCH: 28.1 pg (ref 26.0–34.0)
MCHC: 32.2 g/dL (ref 30.0–36.0)
MCV: 87.1 fL (ref 80.0–100.0)
Platelets: 244 10*3/uL (ref 150–400)
RBC: 4.2 MIL/uL (ref 3.87–5.11)
RDW: 13 % (ref 11.5–15.5)
WBC: 8.9 10*3/uL (ref 4.0–10.5)
nRBC: 0 % (ref 0.0–0.2)

## 2024-11-18 LAB — BASIC METABOLIC PANEL WITH GFR
Anion gap: 8 (ref 5–15)
BUN: 14 mg/dL (ref 6–20)
CO2: 28 mmol/L (ref 22–32)
Calcium: 9.3 mg/dL (ref 8.9–10.3)
Chloride: 105 mmol/L (ref 98–111)
Creatinine, Ser: 1.06 mg/dL — ABNORMAL HIGH (ref 0.44–1.00)
GFR, Estimated: >60 mL/min
Glucose, Bld: 97 mg/dL (ref 70–99)
Potassium: 4 mmol/L (ref 3.5–5.1)
Sodium: 141 mmol/L (ref 135–145)

## 2024-11-18 LAB — MAGNESIUM: Magnesium: 2 mg/dL (ref 1.7–2.4)

## 2024-11-18 MED ORDER — OXYCODONE HCL 5 MG PO TABS
5.0000 mg | ORAL_TABLET | ORAL | 0 refills | Status: AC | PRN
  Filled 2024-11-18: qty 12, 2d supply, fill #0

## 2024-11-18 MED ORDER — POLYETHYLENE GLYCOL 3350 17 G PO PACK
17.0000 g | PACK | Freq: Every day | ORAL | Status: DC
  Administered 2024-11-18: 17 g via ORAL
  Filled 2024-11-18: qty 1

## 2024-11-18 NOTE — Discharge Instructions (Signed)

## 2024-11-18 NOTE — Progress Notes (Signed)
 2 Days Post-Op   Subjective/Chief Complaint: Feels well today, better than before surgery.  She is diet without any nausea and reports pain is well-controlled.  She does feel bloated.  She is passing flatus but still no bowel movement yet.   Objective: Vital signs in last 24 hours: Temp:  [97.6 F (36.4 C)-98.4 F (36.9 C)] 97.6 F (36.4 C) (01/26 0459) Pulse Rate:  [65-84] 65 (01/26 0459) Resp:  [16-17] 16 (01/26 0459) BP: (124-188)/(83-118) 153/91 (01/26 0459) SpO2:  [98 %-100 %] 100 % (01/26 0459) Last BM Date : 11/15/24  Intake/Output from previous day: 01/25 0701 - 01/26 0700 In: 360 [P.O.:360] Out: -  Intake/Output this shift: No intake/output data recorded.  Alert, well-appearing Unlabored respirations Abdomen is soft, obese, appropriately mildly tender around incision.  Or dressing is clean, dry and intact and there is no appreciable swelling, hematoma or cellulitis  Lab Results:  Recent Labs    11/17/24 0505 11/18/24 0418  WBC 11.7* 8.9  HGB 12.1 11.8*  HCT 38.7 36.6  PLT 233 244   BMET Recent Labs    11/17/24 0505 11/18/24 0418  NA 138 141  K 3.4* 4.0  CL 103 105  CO2 25 28  GLUCOSE 152* 97  BUN 10 14  CREATININE 1.04* 1.06*  CALCIUM 9.5 9.3   PT/INR No results for input(s): LABPROT, INR in the last 72 hours. ABG No results for input(s): PHART, HCO3 in the last 72 hours.  Invalid input(s): PCO2, PO2  Studies/Results: No results found.  Anti-infectives: Anti-infectives (From admission, onward)    None       Assessment/Plan:  1/24: Open primary repair of incarcerated incisional hernia  Advance diet Hypokalemia resolved Creatinine very slightly increased-monitor/recheck tomorrow, may need to stop Toradol ; Cr ok today.  Mobilize Ileus is not unlikely though there was not really much bowel manipulation IntraOp.  Potential discharge later today or tomorrow if she is doing well and able to have a bowel movement. Will give  dose of miralax   Discussed dc instructions with pt   LOS: 3 days    Camellia Blush 11/18/2024

## 2024-11-18 NOTE — Plan of Care (Signed)

## 2024-11-18 NOTE — TOC Transition Note (Signed)
 Transition of Care [facility]) - Discharge Note   Patient Details  Name: Michele Garcia MRN: [id number] Date of Birth: [birth year]  [facility] CM/SW Contact:  Heather DELENA Saltness, LCSW Phone Number: 11/18/2024, 2:34 PM   Clinical Narrative:    CSW spoke with pt via phone call to discuss discharge planning. Pt confirmed she is currently uninsured with no PCP. Pt agreeable to PCP appointment, denies day/time preference. CSW scheduled new patient appointment on Tuesday February 24 at 9 AM with Rosina Senters at [facility]. Appointment information added to AVS.    Final next level of care: Home/Self Care Barriers to Discharge: Barriers Resolved   Patient Goals and CMS Choice Patient states their goals for this hospitalization and ongoing recovery are:: To return home   Choice offered to / list presented to : NA [facility] ownership interest in [facility].provided to:: Parent NA    Discharge Placement  Home              Patient to be transferred to facility by: Spouse Name of family member notified: Patient Patient and family notified of of transfer: 11/18/24  Discharge Plan and Services Additional resources added to the After Visit Summary for  Follow Up                DME Arranged: N/A DME Agency: NA       HH Arranged: NA HH Agency: NA        Social Drivers of Health (SDOH) Interventions SDOH Screenings   Food Insecurity: No Food Insecurity (11/15/2024)  Housing: Low Risk (11/15/2024)  Transportation Needs: No Transportation Needs (11/15/2024)  Utilities: Not At Risk (11/15/2024)  Social Connections: Socially Integrated (11/15/2024)  Tobacco Use: Low Risk  (11/15/2024)   Received from [facility]     Readmission Risk Interventions    11/18/2024    2:33 PM  Readmission Risk Prevention Plan  Post Dischage Appt Complete  Medication Screening Complete  Transportation Screening Complete      Signed: Heather Saltness, MSW, LCSW Clinical Social Worker Inpatient Care Management 11/18/2024 2:34 PM

## 2024-11-18 NOTE — Progress Notes (Signed)
 Discharge medications delivered to patient at the bedside in a secure bag.

## 2024-11-19 NOTE — Discharge Summary (Signed)
 " Physician Discharge Summary  Michele Garcia FMW:[id number] DOB: 1966/04/04 DOA: 11/15/2024  PCP: Freddrick, No  Admit date: 11/15/2024 Discharge date: 11/18/2024   Recommendations for Outpatient Follow-up:   (include homehealth, outpatient follow-up instructions, specific recommendations for PCP to follow-up on, etc.)   Follow-up Information     [facility] Follow up.   Specialty: General Surgery Contact information: [address] [place]  [address] [phone]        [facility]. Go on 12/17/2024.   Why: You have a new patient appointment on Tuesday February 24 at 9 AM with Rosina Senters. If a cancellation is unavoidable, please cancel your visit at least 24 hours in advance so that we may give this time to another patient. Contact information: Address: [address] [place], [place] [address] Phone: [phone]               Discharge Diagnoses:  Principal Problem:   Incarcerated hernia of abdominal cavity   Surgical Procedure: epigastric hernia repair  Discharge Condition: Good Disposition: Home  Diet recommendation: reg diet   Hospital Course:  She came to the ED for abdominal pain, hernia firmness and nausea. She underwent hernia repair. Post op she was slowly advanced on a diet. She had return of bowel function and was discharged home POD 2.  Discharge Instructions  Discharge Instructions     Diet - low sodium heart healthy   Complete by: As directed    Discharge wound care:   Complete by: As directed    Shower normal tomorrow. Glue to stay on for 10-14 days. No bandage needed.   Increase activity slowly   Complete by: As directed       Allergies as of 11/18/2024       Reactions   Anesthetics, Amide Other (See Comments)   Trouble being woken up from being put under        Medication List     TAKE these medications    aspirin EC 81 MG  tablet Take 81 mg by mouth at bedtime.   losartan  25 MG tablet Commonly known as: COZAAR  Take 1 tablet by mouth daily.   naproxen  sodium 220 MG tablet Commonly known as: ALEVE  Take 220 mg by mouth daily as needed (pain).   oxyCODONE  5 MG immediate release tablet Commonly known as: Oxy IR/ROXICODONE  Take 1 tablet (5 mg total) by mouth every 4 (four) hours as needed for moderate pain (pain score 4-6).               Discharge Care Instructions  (From admission, onward)           Start     Ordered   11/18/24 0000  Discharge wound care:       Comments: Shower normal tomorrow. Glue to stay on for 10-14 days. No bandage needed.   11/18/24 1314            Follow-up Information     [facility] Follow up.   Specialty: General Surgery Contact information: [address] [place]  [address] [phone]        [facility]. Go on 12/17/2024.   Why: You have a new patient appointment on Tuesday February 24 at 9 AM with Rosina Senters. If a cancellation is unavoidable, please cancel your visit at least 24 hours in advance so that we may give this time  to another patient. Contact information: Address: [address] [place], [place] [address] Phone: [phone]                 The results of significant diagnostics from this hospitalization (including imaging, microbiology, ancillary and laboratory) are listed below for reference.    Significant Diagnostic Studies: CT ABDOMEN PELVIS W CONTRAST Result Date: 11/13/2024 CLINICAL DATA:  Abdominal pain.  Hernia suspected. EXAM: CT ABDOMEN AND PELVIS WITH CONTRAST TECHNIQUE: Multidetector CT imaging of the abdomen and pelvis was performed using the standard protocol following bolus administration of intravenous contrast. RADIATION DOSE REDUCTION: This exam was performed according to the departmental dose-optimization program  which includes automated exposure control, adjustment of the mA and/or kV according to patient size and/or use of iterative reconstruction technique. CONTRAST:  75mL OMNIPAQUE  IOHEXOL  350 MG/ML SOLN COMPARISON:  CT abdomen pelvis dated 03/15/2024. FINDINGS: Lower chest: There is lung bases are clear. No intra-abdominal free air or free fluid. Hepatobiliary: Fatty liver. No biliary dilatation. The gallbladder is unremarkable Pancreas: Unremarkable. No pancreatic ductal dilatation or surrounding inflammatory changes. Spleen: Normal in size without focal abnormality. Adrenals/Urinary Tract: The adrenal glands, kidneys, visualized ureters, and urinary bladder appear unremarkable. Stomach/Bowel: Several small scattered colonic diverticula noted. There is herniation of a short segment of small bowel into the midline supraumbilical hernia. There is pinching of the bowel in the neck of the hernia. There is dilatation and edema of the small bowel loops proximal to the hernia consistent with bowel obstruction. Overall thickened and inflamed small bowel proximal to the hernia worsened since the prior CT. Developing ischemia is not excluded. Clinical correlation and surgical consult is advised. There is no pneumatosis at this time. The appendix is normal. Vascular/Lymphatic: The abdominal aorta and IVC unremarkable. No portal venous gas. There is no adenopathy. Reproductive: Hysterectomy.  No suspicious adnexal masses. Other: Midline vertical anterior pelvic wall incisional scar. Musculoskeletal: Degenerative changes of the spine. No acute osseous pathology. IMPRESSION: 1. Supraumbilical hernia containing a short segment of small bowel with small-bowel obstruction. Early bowel ischemia is not excluded. Surgical consult is advised. 2. Fatty liver. Electronically Signed   By: Vanetta Chou M.D.   On: 11/13/2024 16:16    Labs: Basic Metabolic Panel: Recent Labs  Lab 11/13/24 1430 11/15/24 1851 11/16/24 0437  11/17/24 0505 11/18/24 0418  NA 137  --  140 138 141  K 3.8  --  3.6 3.4* 4.0  CL 101  --  104 103 105  CO2 24  --  28 25 28   GLUCOSE 115*  --  98 152* 97  BUN 12  --  13 10 14   CREATININE 0.98 0.99 0.92 1.04* 1.06*  CALCIUM 9.4  --  9.3 9.5 9.3  MG  --   --   --   --  2.0   Liver Function Tests: Recent Labs  Lab 11/13/24 1430  AST 21  ALT 20  ALKPHOS 131*  BILITOT 0.6  PROT 7.6  ALBUMIN 4.2    CBC: Recent Labs  Lab 11/13/24 1430 11/15/24 1851 11/16/24 0437 11/17/24 0505 11/18/24 0418  WBC 11.4* 7.4 6.6 11.7* 8.9  NEUTROABS 9.4*  --   --   --   --   HGB 13.6 12.2 11.5* 12.1 11.8*  HCT 42.5 38.6 36.5 38.7 36.6  MCV 86.7 88.3 88.2 88.0 87.1  PLT 259 240 226 233 244    CBG: No results for input(s): GLUCAP in the last 168 hours.  Principal Problem:  Incarcerated hernia of abdominal cavity   Time coordinating discharge: 15 min  "

## 2024-12-17 ENCOUNTER — Ambulatory Visit: Payer: Self-pay | Admitting: Internal Medicine
